# Patient Record
Sex: Male | Born: 1950
Health system: Southern US, Community
[De-identification: ages and names within clinical notes are randomized; demographics above are authoritative.]

## PROBLEM LIST (undated history)

## (undated) DIAGNOSIS — E7800 Pure hypercholesterolemia, unspecified: Secondary | ICD-10-CM

## (undated) DIAGNOSIS — Q969 Turner's syndrome, unspecified: Secondary | ICD-10-CM

## (undated) DIAGNOSIS — M5412 Radiculopathy, cervical region: Secondary | ICD-10-CM

## (undated) DIAGNOSIS — K922 Gastrointestinal hemorrhage, unspecified: Secondary | ICD-10-CM

## (undated) DIAGNOSIS — M1990 Unspecified osteoarthritis, unspecified site: Secondary | ICD-10-CM

## (undated) DIAGNOSIS — B9681 Helicobacter pylori [H. pylori] as the cause of diseases classified elsewhere: Secondary | ICD-10-CM

## (undated) DIAGNOSIS — K259 Gastric ulcer, unspecified as acute or chronic, without hemorrhage or perforation: Secondary | ICD-10-CM

## (undated) DIAGNOSIS — K279 Peptic ulcer, site unspecified, unspecified as acute or chronic, without hemorrhage or perforation: Secondary | ICD-10-CM

## (undated) HISTORY — PX: HERNIA REPAIR: SHX51

## (undated) HISTORY — PX: JOINT REPLACEMENT: SHX530

---

## 1997-10-07 ENCOUNTER — Emergency Department (HOSPITAL_COMMUNITY): Admission: EM | Admit: 1997-10-07 | Discharge: 1997-10-07 | Payer: Self-pay | Admitting: Emergency Medicine

## 1997-12-17 ENCOUNTER — Emergency Department (HOSPITAL_COMMUNITY): Admission: EM | Admit: 1997-12-17 | Discharge: 1997-12-17 | Payer: Self-pay | Admitting: Emergency Medicine

## 2000-05-19 ENCOUNTER — Emergency Department (HOSPITAL_COMMUNITY): Admission: EM | Admit: 2000-05-19 | Discharge: 2000-05-19 | Payer: Self-pay | Admitting: Emergency Medicine

## 2000-05-19 ENCOUNTER — Encounter: Payer: Self-pay | Admitting: Emergency Medicine

## 2000-05-20 ENCOUNTER — Ambulatory Visit (HOSPITAL_COMMUNITY): Admission: RE | Admit: 2000-05-20 | Discharge: 2000-05-20 | Payer: Self-pay | Admitting: Emergency Medicine

## 2000-05-20 ENCOUNTER — Encounter: Payer: Self-pay | Admitting: Emergency Medicine

## 2000-11-24 ENCOUNTER — Emergency Department (HOSPITAL_COMMUNITY): Admission: EM | Admit: 2000-11-24 | Discharge: 2000-11-24 | Payer: Self-pay | Admitting: Emergency Medicine

## 2000-11-24 ENCOUNTER — Encounter: Payer: Self-pay | Admitting: Emergency Medicine

## 2001-01-04 ENCOUNTER — Emergency Department (HOSPITAL_COMMUNITY): Admission: EM | Admit: 2001-01-04 | Discharge: 2001-01-04 | Payer: Self-pay | Admitting: Emergency Medicine

## 2001-02-21 ENCOUNTER — Encounter: Payer: Self-pay | Admitting: Emergency Medicine

## 2001-02-21 ENCOUNTER — Emergency Department (HOSPITAL_COMMUNITY): Admission: EM | Admit: 2001-02-21 | Discharge: 2001-02-21 | Payer: Self-pay | Admitting: Emergency Medicine

## 2002-01-12 ENCOUNTER — Ambulatory Visit (HOSPITAL_COMMUNITY): Admission: RE | Admit: 2002-01-12 | Discharge: 2002-01-12 | Payer: Self-pay | Admitting: Orthopedic Surgery

## 2002-01-12 ENCOUNTER — Encounter: Payer: Self-pay | Admitting: Orthopedic Surgery

## 2002-03-13 ENCOUNTER — Encounter: Payer: Self-pay | Admitting: *Deleted

## 2002-03-13 ENCOUNTER — Encounter: Admission: RE | Admit: 2002-03-13 | Discharge: 2002-03-13 | Payer: Self-pay | Admitting: *Deleted

## 2006-12-08 ENCOUNTER — Ambulatory Visit (HOSPITAL_COMMUNITY): Admission: RE | Admit: 2006-12-08 | Discharge: 2006-12-08 | Payer: Self-pay | Admitting: Neurosurgery

## 2006-12-16 ENCOUNTER — Encounter: Admission: RE | Admit: 2006-12-16 | Discharge: 2006-12-16 | Payer: Self-pay | Admitting: Neurosurgery

## 2007-01-18 ENCOUNTER — Encounter: Admission: RE | Admit: 2007-01-18 | Discharge: 2007-02-14 | Payer: Self-pay | Admitting: Neurology

## 2009-02-09 HISTORY — PX: SHOULDER ARTHROSCOPY: SHX128

## 2010-06-27 NOTE — Discharge Summary (Signed)
[place]  Patient:    Richard Arnold, Richard Arnold Visit Number: [id number] MRN: [id number]          Service Type: MED Location: 1800 1831 01 Attending Physician:  Devoria Albe Dictated by:   Wayne C. Dorna Bloom, M.D. Admit Date:  02/21/2001 Disc. Date: 02/22/01   CC:         Sidney Ace, M.D. [facility]  Elisha Ponder, M.D.   Discharge Summary  DISCHARGE DIAGNOSES: 1. Atypical chest pain likely reflux. 2. Degenerative joint disease. 3. Right rotator cuff syndrome. 4. Allergies to MSG.  DISCHARGE MEDICATIONS: 1. Nexium 40 mg a day just recently started. 2. Allegra 180 mg q.a.m. 3. Zyrtec 10 mg q.p.m. 4. Mobic 7.5 mg q.d.  HOSPITAL COURSE:  Please see history and physical for details.  Mr. Flynt was admitted after hours of chest discomfort which was not clearly caused by exertion at all.  Questionably caused by eating.  The patient was monitored overnight.  He had continued chest discomfort about 12 hours but did not bump his enzymes at all. His EKG remained unremarkable.  He was given one p.o. Protonix which definitely seemed to help his pain but it is back a little bit now.  Because his pain was continuous over 12 hours, I would expect that if it were ischemic, he would obviously have caused damage in his cardiac enzymes but he did not.  Particularly since his pain seemed to be relieved with some Protonix, I strongly believe this is likely reflux and the patient agrees. The patient will see [place] back in the office in one or two weeks.  Call back sooner if needed.  At that time, if he should not have any further chest symptoms, I do not believe any further ischemic work-up is indicated. Dictated by:   Wayne C. Dorna Bloom, M.D. Attending Physician:  Devoria Albe DD:  02/22/01 TD:  02/22/01 Job: [id number]

## 2010-06-27 NOTE — H&P (Signed)
[place]  Patient:    STANLY, SI Visit Number: [id number] MRN: [id number]          Service Type: MED Location: 1800 1831 01 Attending Physician:  Devoria Albe Dictated by:   Wayne C. Dorna Bloom, M.D. Admit Date:  02/21/2001   CC:         Elisha Ponder, M.D.  ____________________   History and Physical  HISTORY OF PRESENT ILLNESS:  [age]-year-old white male complains of nausea and chest pressure on and off for two weeks, no association with diet, although he has had similar symptoms with Vioxx and Celebrex in the last two months.  Once this occurred while walking with son.  The patient was able to continue walking today.  He had just finished walking at work, sat down, then had epigastric pressure radiating to the chest and to left arm.  He states it made him feel quite weak.  He still feels a little bit like this. Nitroglycerin was given in the emergency room without much help.  Patient denies dysphagia, cough, fever, shortness of breath.  Patient took an antacid earlier this week but this was of no help.  PAST MEDICAL HISTORY:  Right rotator cuff tears per Dr. Onalee Hua III and now patient has pain.  MRI was scheduled for tomorrow.  DJD of the left knee. ______.  Allergies, severe.  Patient sees ______ .  Chronic elevation of left hemidiaphragm.  Right hand intention tremor.  Hemorrhoids.  PAST SURGICAL HISTORY:  Right rotator cuff repair.  MEDICATIONS: 1. Allegra 180 mg q.a.m. 2. Zyrtec 10 mg q.p.m. 3. Mobic 7.5 mg q.d.  ALLERGIES:  KAOPECTATE causes a rash.  HEALTH MAINTENANCE:  Tetanus in September of 1997.  Normal flexible sigmoidoscopy and air contrast barium enema in May of 2000.  FAMILY HISTORY:  Father with coronary artery disease and CABG in his 78s; father also had colon CA and diabetes.  SOCIAL HISTORY:  Denies tobacco, alcohol or recreational drug use.  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  Married and  one child.  REVIEW OF SYSTEMS:  Left shoulder pain secondary to lying on it secondary to the right shoulder problems.  PHYSICAL EXAMINATION:  GENERAL:  Patient is comfortable and in no acute distress.  VITAL SIGNS:  Blood pressure is 149/90.  Pulse is 73 and regular.  Respiratory rate of 18.  Temperature is 97.1.  HEENT:  Negative.  CARDIOVASCULAR:  Regular rate and rhythm.  Normal S1 and S2.  Without murmurs, rubs, or gallops.  LUNGS:  Clear to auscultation bilaterally.  ABDOMEN:  Soft, normoactive bowel sounds, nontender.  EXTREMITIES:  No clubbing, cyanosis, or edema.  RECTAL:  Normal tone.  Guaiac negative.  NEUROLOGIC:  Motor strength is 5/5 bilaterally.  Sensation intact to light touch and pinprick.  Cranial nerves II-XII are intact.  ORTHOPEDICS:  All joints including shoulders have normal range of motion, no impingement sign.  LABORATORY DATA:  Chest x-ray:  Elevated left hemidiaphragm, otherwise, no active disease.  EKG is normal sinus rhythm, rate of 66.  Laboratories including complete metabolic profile are unremarkable.  CBC was unremarkable.  Troponin I 0.01, CK total of 102 and MB of 1.6.  PT and PTT are normal.  ASSESSMENT AND PLAN:  Atypical chest pain, questionable ischemia, doubt but cannot rule out, most likely secondary to reflux, however, and esophageal spasm.  Start Protonix.  Give Lovenox, nitroglycerin, enzymes and monitor.  If negative enzymes, probably Cardiolite with cardiology  consult. Dictated by:   Wayne C. Dorna Bloom, M.D. Attending Physician:  Devoria Albe DD:  02/21/01 TD:  02/22/01 Job: [id number]

## 2011-01-16 ENCOUNTER — Encounter (HOSPITAL_COMMUNITY): Payer: Self-pay | Admitting: Pharmacy Technician

## 2011-01-20 NOTE — H&P (Signed)
60 y/o right hand dominant male with worsening right shoulder pain. Pt denies injury. Pt has tried multiple injections and strengthening exercises to relieve his pain without any relief. Pt has elected to have a right total shoulder arthroplasty by Dr. Ranell Patrick to decrease pain and increase function. ROS: right shoulder pain with good strength, otherwise negative PMH: hyperlipidemia Allergies: tylox MEDS: ambien, aspirin, atorvastatin, fish oil, vitamin C, naprelan Family: coronary heart disease, cancer, congestive heart failure, diabetes PE: healthy appropriate 60 year old male in no acute distress alert and oriented x 3.  Cervical spine: full rom, no tenderness to palpation, nv intact distally, cranial nerves 2-12 intact Right shoulder: mild decrease in rom, moderate crepitus with rom, strength 5-5 bilaterally Assessment: Endstage osteoarthritis to right shoulder Plan: Total shoulder arthroplasty for pain relief and to increase rom

## 2011-01-23 ENCOUNTER — Encounter (HOSPITAL_COMMUNITY)
Admission: RE | Admit: 2011-01-23 | Discharge: 2011-01-23 | Disposition: A | Discharge status: Home / Self Care | Payer: 59 | Source: Ambulatory Visit | Attending: Orthopedic Surgery | Admitting: Orthopedic Surgery

## 2011-01-23 ENCOUNTER — Encounter (HOSPITAL_COMMUNITY): Payer: Self-pay

## 2011-01-23 HISTORY — DX: Unspecified osteoarthritis, unspecified site: M19.90

## 2011-01-23 LAB — ABO/RH: ABO/RH(D): O POS

## 2011-01-23 LAB — TYPE AND SCREEN: Antibody Screen: NEGATIVE

## 2011-01-23 LAB — DIFFERENTIAL
Basophils Relative: 1 % (ref 0–1)
Eosinophils Absolute: 0.2 10*3/uL (ref 0.0–0.7)
Lymphocytes Relative: 44 % (ref 12–46)
Lymphs Abs: 2.6 10*3/uL (ref 0.7–4.0)
Monocytes Relative: 9 % (ref 3–12)
Neutro Abs: 2.6 10*3/uL (ref 1.7–7.7)
Neutrophils Relative %: 44 % (ref 43–77)

## 2011-01-23 LAB — CBC
HCT: 42.3 % (ref 39.0–52.0)
MCH: 30.3 pg (ref 26.0–34.0)
MCHC: 35 g/dL (ref 30.0–36.0)
MCV: 86.5 fL (ref 78.0–100.0)
Platelets: 196 10*3/uL (ref 150–400)
RBC: 4.89 MIL/uL (ref 4.22–5.81)

## 2011-01-23 LAB — BASIC METABOLIC PANEL
Creatinine, Ser: 0.99 mg/dL (ref 0.50–1.35)
Potassium: 4.1 mEq/L (ref 3.5–5.1)

## 2011-01-23 LAB — PROTIME-INR: Prothrombin Time: 12.6 seconds (ref 11.6–15.2)

## 2011-01-23 MED ORDER — CHLORHEXIDINE GLUCONATE 4 % EX LIQD: 60.0000 mL | Freq: Once | CUTANEOUS | Status: DC

## 2011-01-23 NOTE — Pre-Procedure Instructions (Signed)
20 Richard Arnold  01/23/2011   Your procedure is scheduled on:  01/30/11  Report to [facility] at 800 AM.  Call this number if you have problems the morning of surgery: [phone]   Remember:   Do not eat food:After Midnight.  May have clear liquids: up to 4 Hours before arrival.  Clear liquids include soda, tea, black coffee, apple or grape juice, broth.  Take these medicines the morning of surgery with A SIP OF WATER: pain med   Stop asa , fish oil today   Do not wear jewelry, make-up or nail polish.  Do not wear lotions, powders, or perfumes. You may wear deodorant.  Do not shave 48 hours prior to surgery.  Do not bring valuables to the hospital.  Contacts, dentures or bridgework may not be worn into surgery.  Leave suitcase in the car. After surgery it may be brought to your room.  For patients admitted to the hospital, checkout time is 11:00 AM the day of discharge.   Patients discharged the day of surgery will not be allowed to drive home.  Name and phone number of your driver: gail [phone]  Special Instructions: Incentive Spirometry - Practice and bring it with you on the day of surgery. showers nite before and morn of surg chin to toes not face or head or privates   Please read over the following fact sheets that you were given: Pain Booklet, Coughing and Deep Breathing, Blood Transfusion Information and MRSA Information

## 2011-01-29 MED ORDER — CEFAZOLIN SODIUM-DEXTROSE 2-3 GM-% IV SOLR
2.0000 g | INTRAVENOUS | Status: AC
  Administered 2011-01-30: 2 g via INTRAVENOUS
  Filled 2011-01-29: qty 50

## 2011-01-30 ENCOUNTER — Encounter (HOSPITAL_COMMUNITY): Payer: Self-pay | Admitting: Anesthesiology

## 2011-01-30 ENCOUNTER — Encounter (HOSPITAL_COMMUNITY): Payer: Self-pay | Admitting: Orthopedic Surgery

## 2011-01-30 ENCOUNTER — Ambulatory Visit (HOSPITAL_COMMUNITY): Payer: 59

## 2011-01-30 ENCOUNTER — Encounter (HOSPITAL_COMMUNITY)
Admission: RE | Disposition: A | Discharge status: Home / Self Care | Payer: Self-pay | Source: Ambulatory Visit | Attending: Orthopedic Surgery

## 2011-01-30 ENCOUNTER — Inpatient Hospital Stay (HOSPITAL_COMMUNITY)
Admission: RE | Admit: 2011-01-30 | Discharge: 2011-01-31 | DRG: 484 | Disposition: A | Discharge status: Home / Self Care | Payer: 59 | Source: Ambulatory Visit | Attending: Orthopedic Surgery | Admitting: Orthopedic Surgery

## 2011-01-30 ENCOUNTER — Ambulatory Visit (HOSPITAL_COMMUNITY): Payer: 59 | Admitting: Anesthesiology

## 2011-01-30 DIAGNOSIS — M19019 Primary osteoarthritis, unspecified shoulder: Principal | ICD-10-CM | POA: Diagnosis present

## 2011-01-30 DIAGNOSIS — E785 Hyperlipidemia, unspecified: Secondary | ICD-10-CM | POA: Diagnosis present

## 2011-01-30 DIAGNOSIS — Z01812 Encounter for preprocedural laboratory examination: Secondary | ICD-10-CM

## 2011-01-30 DIAGNOSIS — Z7982 Long term (current) use of aspirin: Secondary | ICD-10-CM

## 2011-01-30 HISTORY — PX: TOTAL SHOULDER ARTHROPLASTY: SHX126

## 2011-01-30 SURGERY — ARTHROPLASTY, SHOULDER, TOTAL
Anesthesia: Regional | Site: Shoulder | Laterality: Right | Wound class: Clean

## 2011-01-30 MED ORDER — HYDROMORPHONE HCL PF 1 MG/ML IJ SOLN
INTRAMUSCULAR | Status: AC
  Filled 2011-01-30: qty 1

## 2011-01-30 MED ORDER — ONDANSETRON HCL 4 MG/2ML IJ SOLN
INTRAMUSCULAR | Status: DC | PRN
  Administered 2011-01-30: 4 mg via INTRAVENOUS

## 2011-01-30 MED ORDER — MIDAZOLAM HCL 2 MG/2ML IJ SOLN
1.0000 mg | INTRAMUSCULAR | Status: DC | PRN
  Administered 2011-01-30: 2 mg via INTRAVENOUS

## 2011-01-30 MED ORDER — OMEGA-3 FATTY ACIDS 1000 MG PO CAPS
1.0000 g | ORAL_CAPSULE | Freq: Three times a day (TID) | ORAL | Status: DC
  Administered 2011-01-30 – 2011-01-31 (×2): 1 g via ORAL
  Filled 2011-01-30 (×4): qty 1

## 2011-01-30 MED ORDER — METOCLOPRAMIDE HCL 5 MG/ML IJ SOLN
5.0000 mg | Freq: Three times a day (TID) | INTRAMUSCULAR | Status: DC | PRN
  Filled 2011-01-30: qty 2

## 2011-01-30 MED ORDER — PHENOL 1.4 % MT LIQD
1.0000 | OROMUCOSAL | Status: DC | PRN
  Filled 2011-01-30: qty 177

## 2011-01-30 MED ORDER — CEFAZOLIN SODIUM 1-5 GM-% IV SOLN
1.0000 g | Freq: Four times a day (QID) | INTRAVENOUS | Status: AC
  Administered 2011-01-30 – 2011-01-31 (×3): 1 g via INTRAVENOUS
  Filled 2011-01-30 (×3): qty 50

## 2011-01-30 MED ORDER — PHENYLEPHRINE HCL 10 MG/ML IJ SOLN
10.0000 mg | INTRAVENOUS | Status: DC | PRN
  Administered 2011-01-30: 50 ug/min via INTRAVENOUS

## 2011-01-30 MED ORDER — METOCLOPRAMIDE HCL 10 MG PO TABS: 5.0000 mg | ORAL_TABLET | Freq: Three times a day (TID) | ORAL | Status: DC | PRN

## 2011-01-30 MED ORDER — HYDROCODONE-ACETAMINOPHEN 5-325 MG PO TABS
1.0000 | ORAL_TABLET | ORAL | Status: DC | PRN
  Administered 2011-01-30: 2 via ORAL
  Administered 2011-01-30: 1 via ORAL
  Administered 2011-01-31 (×2): 2 via ORAL
  Filled 2011-01-30 (×4): qty 2
  Filled 2011-01-30: qty 1

## 2011-01-30 MED ORDER — ASPIRIN EC 81 MG PO TBEC
81.0000 mg | DELAYED_RELEASE_TABLET | Freq: Every day | ORAL | Status: DC
  Administered 2011-01-30 – 2011-01-31 (×2): 81 mg via ORAL
  Filled 2011-01-30 (×2): qty 1

## 2011-01-30 MED ORDER — FENTANYL CITRATE 0.05 MG/ML IJ SOLN
50.0000 ug | INTRAMUSCULAR | Status: DC | PRN
  Administered 2011-01-30: 100 ug via INTRAVENOUS

## 2011-01-30 MED ORDER — HYDROMORPHONE HCL PF 1 MG/ML IJ SOLN
0.2500 mg | INTRAMUSCULAR | Status: DC | PRN
  Administered 2011-01-30 (×4): 0.5 mg via INTRAVENOUS

## 2011-01-30 MED ORDER — METHOCARBAMOL 500 MG PO TABS
500.0000 mg | ORAL_TABLET | Freq: Four times a day (QID) | ORAL | Status: DC | PRN
  Administered 2011-01-30 – 2011-01-31 (×3): 500 mg via ORAL
  Filled 2011-01-30 (×4): qty 1

## 2011-01-30 MED ORDER — BUPIVACAINE-EPINEPHRINE PF 0.5-1:200000 % IJ SOLN
INTRAMUSCULAR | Status: DC | PRN
  Administered 2011-01-30: 25 mL

## 2011-01-30 MED ORDER — FENTANYL CITRATE 0.05 MG/ML IJ SOLN
INTRAMUSCULAR | Status: DC | PRN
  Administered 2011-01-30 (×3): 50 ug via INTRAVENOUS
  Administered 2011-01-30: 100 ug via INTRAVENOUS

## 2011-01-30 MED ORDER — HYDROMORPHONE HCL PF 1 MG/ML IJ SOLN
0.5000 mg | INTRAMUSCULAR | Status: DC | PRN
  Administered 2011-01-30 – 2011-01-31 (×6): 1 mg via INTRAVENOUS
  Filled 2011-01-30 (×5): qty 1

## 2011-01-30 MED ORDER — PHENYLEPHRINE HCL 10 MG/ML IJ SOLN
INTRAMUSCULAR | Status: DC | PRN
  Administered 2011-01-30: 80 ug via INTRAVENOUS
  Administered 2011-01-30 (×2): 40 ug via INTRAVENOUS
  Administered 2011-01-30 (×2): 80 ug via INTRAVENOUS
  Administered 2011-01-30: 40 ug via INTRAVENOUS

## 2011-01-30 MED ORDER — SODIUM CHLORIDE 0.9 % IR SOLN
Status: DC | PRN
  Administered 2011-01-30: 1000 mL

## 2011-01-30 MED ORDER — ACETAMINOPHEN 325 MG PO TABS: 650.0000 mg | ORAL_TABLET | Freq: Four times a day (QID) | ORAL | Status: DC | PRN

## 2011-01-30 MED ORDER — MEPERIDINE HCL 25 MG/ML IJ SOLN: 6.2500 mg | INTRAMUSCULAR | Status: DC | PRN

## 2011-01-30 MED ORDER — ZOLPIDEM TARTRATE 10 MG PO TABS: 10.0000 mg | ORAL_TABLET | Freq: Every evening | ORAL | Status: DC | PRN

## 2011-01-30 MED ORDER — ONDANSETRON HCL 4 MG/2ML IJ SOLN: 4.0000 mg | Freq: Four times a day (QID) | INTRAMUSCULAR | Status: DC | PRN

## 2011-01-30 MED ORDER — SIMVASTATIN 20 MG PO TABS
20.0000 mg | ORAL_TABLET | Freq: Every day | ORAL | Status: DC
  Filled 2011-01-30 (×2): qty 1

## 2011-01-30 MED ORDER — PROPOFOL 10 MG/ML IV EMUL
INTRAVENOUS | Status: DC | PRN
  Administered 2011-01-30: 200 mg via INTRAVENOUS

## 2011-01-30 MED ORDER — LACTATED RINGERS IV SOLN
INTRAVENOUS | Status: DC
  Administered 2011-01-30: 09:00:00 via INTRAVENOUS

## 2011-01-30 MED ORDER — HEMOSTATIC AGENTS (NO CHARGE) OPTIME
TOPICAL | Status: DC | PRN
  Administered 2011-01-30: 1 via TOPICAL

## 2011-01-30 MED ORDER — ACETAMINOPHEN 650 MG RE SUPP: 650.0000 mg | Freq: Four times a day (QID) | RECTAL | Status: DC | PRN

## 2011-01-30 MED ORDER — METHOCARBAMOL 100 MG/ML IJ SOLN
500.0000 mg | Freq: Four times a day (QID) | INTRAVENOUS | Status: DC | PRN
  Administered 2011-01-30: 500 mg via INTRAVENOUS
  Filled 2011-01-30: qty 5

## 2011-01-30 MED ORDER — MORPHINE SULFATE 2 MG/ML IJ SOLN: 0.0500 mg/kg | INTRAMUSCULAR | Status: DC | PRN

## 2011-01-30 MED ORDER — VITAMIN C 500 MG PO TABS
500.0000 mg | ORAL_TABLET | Freq: Every day | ORAL | Status: DC
  Administered 2011-01-30 – 2011-01-31 (×2): 500 mg via ORAL
  Filled 2011-01-30 (×2): qty 1

## 2011-01-30 MED ORDER — MENTHOL 3 MG MT LOZG: 1.0000 | LOZENGE | OROMUCOSAL | Status: DC | PRN

## 2011-01-30 MED ORDER — MIDAZOLAM HCL 2 MG/2ML IJ SOLN
INTRAMUSCULAR | Status: AC
  Filled 2011-01-30: qty 2

## 2011-01-30 MED ORDER — FENTANYL CITRATE 0.05 MG/ML IJ SOLN
INTRAMUSCULAR | Status: AC
  Filled 2011-01-30: qty 2

## 2011-01-30 MED ORDER — ROCURONIUM BROMIDE 100 MG/10ML IV SOLN
INTRAVENOUS | Status: DC | PRN
  Administered 2011-01-30: 50 mg via INTRAVENOUS

## 2011-01-30 MED ORDER — ONDANSETRON HCL 4 MG PO TABS: 4.0000 mg | ORAL_TABLET | Freq: Four times a day (QID) | ORAL | Status: DC | PRN

## 2011-01-30 MED ORDER — LACTATED RINGERS IV SOLN
INTRAVENOUS | Status: DC | PRN
  Administered 2011-01-30 (×2): via INTRAVENOUS

## 2011-01-30 MED ORDER — HYDROCODONE-ACETAMINOPHEN 5-325 MG PO TABS
1.0000 | ORAL_TABLET | Freq: Four times a day (QID) | ORAL | Status: DC | PRN
  Administered 2011-01-30: 1 via ORAL
  Filled 2011-01-30: qty 1

## 2011-01-30 MED ORDER — THROMBIN 20000 UNITS EX KIT
PACK | CUTANEOUS | Status: DC | PRN
  Administered 2011-01-30: 20000 [IU] via TOPICAL

## 2011-01-30 MED ORDER — POLYETHYLENE GLYCOL 3350 17 G PO PACK
17.0000 g | PACK | Freq: Every day | ORAL | Status: DC
  Administered 2011-01-30 – 2011-01-31 (×2): 17 g via ORAL
  Filled 2011-01-30 (×2): qty 1

## 2011-01-30 MED ORDER — BUPIVACAINE-EPINEPHRINE 0.25% -1:200000 IJ SOLN
INTRAMUSCULAR | Status: DC | PRN
  Administered 2011-01-30: 10 mL

## 2011-01-30 MED ORDER — POTASSIUM CHLORIDE IN NACL 20-0.9 MEQ/L-% IV SOLN
INTRAVENOUS | Status: DC
  Filled 2011-01-30 (×3): qty 1000

## 2011-01-30 MED ORDER — ONDANSETRON HCL 4 MG/2ML IJ SOLN: 4.0000 mg | Freq: Once | INTRAMUSCULAR | Status: DC | PRN

## 2011-01-30 SURGICAL SUPPLY — 64 items
BLADE SAW SAG 73X25 THK (BLADE) ×1
BLADE SAW SGTL 73X25 THK (BLADE) ×1 IMPLANT
BUR SURG 4X8 MED (BURR) IMPLANT
BURR SURG 4X8 MED (BURR)
CEMENT HV SMART SET (Cement) ×2 IMPLANT
CLOTH BEACON ORANGE TIMEOUT ST (SAFETY) ×2 IMPLANT
COVER SURGICAL LIGHT HANDLE (MISCELLANEOUS) ×2 IMPLANT
DRAPE INCISE IOBAN 66X45 STRL (DRAPES) ×2 IMPLANT
DRAPE U-SHAPE 47X51 STRL (DRAPES) ×2 IMPLANT
DRAPE X-RAY CASS 24X20 (DRAPES) IMPLANT
DRILL BIT 5/64 (BIT) IMPLANT
DRSG ADAPTIC 3X8 NADH LF (GAUZE/BANDAGES/DRESSINGS) ×2 IMPLANT
DRSG PAD ABDOMINAL 8X10 ST (GAUZE/BANDAGES/DRESSINGS) ×2 IMPLANT
DURAPREP 26ML APPLICATOR (WOUND CARE) ×2 IMPLANT
ELECT BLADE 4.0 EZ CLEAN MEGAD (MISCELLANEOUS) ×2
ELECT NEEDLE TIP 2.8 STRL (NEEDLE) ×2 IMPLANT
ELECT REM PT RETURN 9FT ADLT (ELECTROSURGICAL) ×2
ELECTRODE BLDE 4.0 EZ CLN MEGD (MISCELLANEOUS) ×1 IMPLANT
ELECTRODE REM PT RTRN 9FT ADLT (ELECTROSURGICAL) ×1 IMPLANT
GLOVE BIOGEL PI IND STRL 8 (GLOVE) IMPLANT
GLOVE BIOGEL PI INDICATOR 8 (GLOVE)
GLOVE ORTHO TXT STRL SZ7.5 (GLOVE) IMPLANT
GLOVE SURG ORTHO 8.0 STRL STRW (GLOVE) IMPLANT
GOWN BRE IMP SLV SIRUS LXLNG (GOWN DISPOSABLE) ×4 IMPLANT
GOWN STRL NON-REIN LRG LVL3 (GOWN DISPOSABLE) IMPLANT
HANDPIECE INTERPULSE COAX TIP (DISPOSABLE)
KIT BASIN OR (CUSTOM PROCEDURE TRAY) ×2 IMPLANT
KIT ROOM TURNOVER OR (KITS) ×2 IMPLANT
MANIFOLD NEPTUNE II (INSTRUMENTS) ×2 IMPLANT
NDL SUT 6 .5 CRC .975X.05 MAYO (NEEDLE) ×1 IMPLANT
NEEDLE 1/2 CIR MAYO (NEEDLE) ×2 IMPLANT
NEEDLE HYPO 25GX1X1/2 BEV (NEEDLE) ×2 IMPLANT
NEEDLE MAYO TAPER (NEEDLE) ×1
NS IRRIG 1000ML POUR BTL (IV SOLUTION) ×2 IMPLANT
PACK SHOULDER (CUSTOM PROCEDURE TRAY) ×2 IMPLANT
PAD ARMBOARD 7.5X6 YLW CONV (MISCELLANEOUS) ×4 IMPLANT
SET HNDPC FAN SPRY TIP SCT (DISPOSABLE) IMPLANT
SLING ARM IMMOBILIZER LRG (SOFTGOODS) ×2 IMPLANT
SLING ARM IMMOBILIZER MED (SOFTGOODS) IMPLANT
SMARTMIX MINI TOWER (MISCELLANEOUS) ×2
SPONGE GAUZE 4X4 12PLY (GAUZE/BANDAGES/DRESSINGS) ×2 IMPLANT
SPONGE LAP 18X18 X RAY DECT (DISPOSABLE) ×2 IMPLANT
SPONGE LAP 4X18 X RAY DECT (DISPOSABLE) ×2 IMPLANT
SPONGE SURGIFOAM ABS GEL SZ50 (HEMOSTASIS) ×2 IMPLANT
STRIP CLOSURE SKIN 1/2X4 (GAUZE/BANDAGES/DRESSINGS) IMPLANT
SUCTION FRAZIER TIP 10 FR DISP (SUCTIONS) ×2 IMPLANT
SUT FIBERWIRE #2 38 T-5 BLUE (SUTURE) ×8
SUT MNCRL AB 4-0 PS2 18 (SUTURE) ×2 IMPLANT
SUT VIC AB 0 CT1 27 (SUTURE) ×1
SUT VIC AB 0 CT1 27XBRD ANBCTR (SUTURE) ×1 IMPLANT
SUT VIC AB 2-0 CT1 27 (SUTURE) ×1
SUT VIC AB 2-0 CT1 TAPERPNT 27 (SUTURE) ×1 IMPLANT
SUT VICRYL AB 2 0 TIES (SUTURE) IMPLANT
SUTURE FIBERWR #2 38 T-5 BLUE (SUTURE) ×4 IMPLANT
SWABSTICK BENZOIN STERILE (MISCELLANEOUS) IMPLANT
SYR CONTROL 10ML LL (SYRINGE) ×2 IMPLANT
SYR TOOMEY 50ML (SYRINGE) IMPLANT
TAPE CLOTH SURG 6X10 WHT LF (GAUZE/BANDAGES/DRESSINGS) ×2 IMPLANT
TOWEL OR 17X24 6PK STRL BLUE (TOWEL DISPOSABLE) ×2 IMPLANT
TOWEL OR 17X26 10 PK STRL BLUE (TOWEL DISPOSABLE) ×2 IMPLANT
TOWER SMARTMIX MINI (MISCELLANEOUS) ×1 IMPLANT
TRAY FOLEY CATH 14FR (SET/KITS/TRAYS/PACK) IMPLANT
WATER STERILE IRR 1000ML POUR (IV SOLUTION) ×2 IMPLANT
YANKAUER SUCT BULB TIP NO VENT (SUCTIONS) IMPLANT

## 2011-01-30 NOTE — Anesthesia Procedure Notes (Addendum)
Anesthesia Regional Block:  Interscalene brachial plexus block  Pre-Anesthetic Checklist: ,, timeout performed, Correct Patient, Correct Site, Correct Laterality, Correct Procedure, Correct Position, site marked, Risks and benefits discussed,  Surgical consent,  Pre-op evaluation,  At surgeon's request and post-op pain management  Laterality: Right and Upper  Prep: chloraprep       Needles:   Needle Type: Echogenic Stimulator Needle     Needle Length: 5cm 5 cm Needle Gauge: 22 and 22 G    Additional Needles:  Procedures: ultrasound guided Interscalene brachial plexus block Narrative:  Start time: 01/30/2011 9:20 AM End time: 01/30/2011 9:30 AM Injection made incrementally with aspirations every 5 mL.  Performed by: Personally  Anesthesiologist: Sheldon Silvan, MD  Additional Notes: 25 ml of 0.5% Marcaine w/  1:200000 Epi  Interscalene brachial plexus block Procedure Name: Intubation Date/Time: 01/30/2011 10:25 AM Performed by: Rossie Muskrat Pre-anesthesia Checklist: Patient identified, Timeout performed, Emergency Drugs available, Suction available and Patient being monitored Patient Re-evaluated:Patient Re-evaluated prior to inductionOxygen Delivery Method: Circle System Utilized Preoxygenation: Pre-oxygenation with 100% oxygen Intubation Type: IV induction Ventilation: Mask ventilation without difficulty Laryngoscope Size: Miller and 3 Grade View: Grade I Tube type: Oral Tube size: 7.5 mm Number of attempts: 1 Airway Equipment and Method: stylet Placement Confirmation: ETT inserted through vocal cords under direct vision,  breath sounds checked- equal and bilateral and positive ETCO2 Secured at: 21 cm Tube secured with: Tape Dental Injury: Teeth and Oropharynx as per pre-operative assessment

## 2011-01-30 NOTE — Transfer of Care (Signed)
Immediate Anesthesia Transfer of Care Note  Patient: Richard Arnold  Procedure(s) Performed:  TOTAL SHOULDER ARTHROPLASTY - right total shoulder atrhoplasty  Patient Location: PACU  Anesthesia Type: General  Level of Consciousness: awake, alert  and oriented  Airway & Oxygen Therapy: Patient Spontanous Breathing and Patient connected to nasal cannula oxygen  Post-op Assessment: Report given to PACU RN, Post -op Vital signs reviewed and stable and Patient moving all extremities X 4  Post vital signs: Reviewed and stable  Complications: No apparent anesthesia complications

## 2011-01-30 NOTE — Anesthesia Postprocedure Evaluation (Signed)
  Anesthesia Post-op Note  Patient: Richard Arnold  Procedure(s) Performed:  TOTAL SHOULDER ARTHROPLASTY - right total shoulder atrhoplasty  Patient Location: PACU  Anesthesia Type: GA combined with regional for post-op pain  Level of Consciousness: awake, alert  and oriented  Airway and Oxygen Therapy: Patient Spontanous Breathing and Patient connected to nasal cannula oxygen  Post-op Pain: mild  Post-op Assessment: Post-op Vital signs reviewed, Patient's Cardiovascular Status Stable, Respiratory Function Stable, Patent Airway, No signs of Nausea or vomiting and Pain level not controlled  Post-op Vital Signs: Reviewed and stable  Complications: No apparent anesthesia complications

## 2011-01-30 NOTE — Discharge Summary (Signed)
Physician Discharge Summary  Patient ID: Richard Arnold MRN: [id number] DOB/AGE: 1950-10-12 60 y.o.  Admit date: 01/30/2011 Discharge date: 01/30/2011  Admission Diagnoses:  Principal Problem:  *Osteoarthrosis, unspecified whether generalized or localized, shoulder region   Discharge Diagnoses:  Same   Surgeries: Procedure(s): TOTAL SHOULDER ARTHROPLASTY on 01/30/2011   Consultants: Physical Therapy, D/C Planning  Discharged Condition: Stable  Hospital Course: Richard Arnold is an 60 y.o. male who was admitted 01/30/2011 with a chief complaint of shoulder pain and loss of function, and found to have a diagnosis of shoulder arthritis.  They were brought to the operating room on 01/30/2011 and underwent the above named procedures.    The patient had an uncomplicated hospital course, pain was well controlled and was stable for discharge.  Recent vital signs:  Filed Vitals:   01/30/11 0936  BP:   Pulse: 95  Temp:   Resp: 18    Recent laboratory studies:  Results for orders placed during the hospital encounter of 01/23/11  BASIC METABOLIC PANEL      Component Value Range   Sodium 139  135 - 145 (mEq/L)   Potassium 4.1  3.5 - 5.1 (mEq/L)   Chloride 103  96 - 112 (mEq/L)   CO2 28  19 - 32 (mEq/L)   Glucose, Bld 91  70 - 99 (mg/dL)   BUN 22  6 - 23 (mg/dL)   Creatinine, Ser 4.09  0.50 - 1.35 (mg/dL)   Calcium 9.6  8.4 - 81.1 (mg/dL)   GFR calc non Af Amer 86 (*) >90 (mL/min)   GFR calc Af Amer >90  >90 (mL/min)  CBC      Component Value Range   WBC 5.9  4.0 - 10.5 (K/uL)   RBC 4.89  4.22 - 5.81 (MIL/uL)   Hemoglobin 14.8  13.0 - 17.0 (g/dL)   HCT 91.4  78.2 - 95.6 (%)   MCV 86.5  78.0 - 100.0 (fL)   MCH 30.3  26.0 - 34.0 (pg)   MCHC 35.0  30.0 - 36.0 (g/dL)   RDW 21.3  08.6 - 57.8 (%)   Platelets 196  150 - 400 (K/uL)  DIFFERENTIAL      Component Value Range   Neutrophils Relative 44  43 - 77 (%)   Neutro Abs 2.6  1.7 - 7.7 (K/uL)   Lymphocytes Relative 44  12  - 46 (%)   Lymphs Abs 2.6  0.7 - 4.0 (K/uL)   Monocytes Relative 9  3 - 12 (%)   Monocytes Absolute 0.5  0.1 - 1.0 (K/uL)   Eosinophils Relative 3  0 - 5 (%)   Eosinophils Absolute 0.2  0.0 - 0.7 (K/uL)   Basophils Relative 1  0 - 1 (%)   Basophils Absolute 0.0  0.0 - 0.1 (K/uL)  PROTIME-INR      Component Value Range   Prothrombin Time 12.6  11.6 - 15.2 (seconds)   INR 0.92  0.00 - 1.49   SURGICAL PCR SCREEN      Component Value Range   MRSA, PCR NEGATIVE  NEGATIVE    Staphylococcus aureus NEGATIVE  NEGATIVE   TYPE AND SCREEN      Component Value Range   ABO/RH(D) O POS     Antibody Screen NEG     Sample Expiration 02/06/2011    ABO/RH      Component Value Range   ABO/RH(D) O POS      Discharge Medications:   Current Discharge Medication List  CONTINUE these medications which have NOT CHANGED   Details  Ascorbic Acid (VITAMIN C PO) Take 1 tablet by mouth daily.      aspirin EC 81 MG tablet Take 81 mg by mouth daily.      atorvastatin (LIPITOR) 10 MG tablet Take 10 mg by mouth daily.      fish oil-omega-3 fatty acids 1000 MG capsule Take 1 g by mouth 3 (three) times daily.      HYDROcodone-acetaminophen (NORCO) 5-325 MG per tablet Take 1 tablet by mouth every 6 (six) hours as needed. For pain     zolpidem (AMBIEN) 10 MG tablet Take 10 mg by mouth at bedtime as needed. For sleep          Diagnostic Studies: Post Op XRAY right shoulder show implants in good position  Disposition: to home    Follow-up Information    Follow up with Lexandra Rettke,STEVEN R. Call in 2 weeks.   Contact information:   [facility] [address] [place] [address] [phone]           Signed: Verlee Rossetti 01/30/2011, 10:03 AM

## 2011-01-30 NOTE — Preoperative (Signed)
Beta Blockers   Reason not to administer Beta Blockers:Pt does not take B Blockers 

## 2011-01-30 NOTE — Brief Op Note (Signed)
01/30/2011  1:05 PM  PATIENT:  Richard Arnold  60 y.o. male  PRE-OPERATIVE DIAGNOSIS:  right shoulder OA, end stage  POST-OPERATIVE DIAGNOSIS:  right shoulder OA, end stage  PROCEDURE:  Procedure(s): TOTAL SHOULDER ARTHROPLASTY - [employer]er[employer]  SURGEON:  Surgeon(s): Verlee Rossetti  PHYSICIAN ASSISTANT:   ASSISTANTS: Thea Gist, PA-C`   ANESTHESIA:   regional and general  EBL:  Total I/O In: 1000 [I.V.:1000] Out: -   BLOOD ADMINISTERED:none  DRAINS: none   LOCAL MEDICATIONS USED:  MARCAINE 10 CC  SPECIMEN:  No Specimen  DISPOSITION OF SPECIMEN:  N/A  COUNTS:  YES  TOURNIQUET:  * No tourniquets in log *  DICTATION: .Other Dictation: Dictation Number [id number]  PLAN OF CARE: Admit to inpatient   PATIENT DISPOSITION:  PACU - hemodynamically stable.   Delay start of Pharmacological VTE agent (>24hrs) due to surgical blood loss or risk of bleeding:  {YES/NO/NOT APPLICABLE:20182

## 2011-01-30 NOTE — Anesthesia Preprocedure Evaluation (Addendum)
Anesthesia Evaluation  Patient identified by MRN, date of birth, ID band Patient awake    Reviewed: Allergy & Precautions, H&P , NPO status , Patient's Chart, lab work & pertinent test results, reviewed documented beta blocker date and time   Airway Mallampati: I TM Distance: >3 FB Neck ROM: Full    Dental  (+) Teeth Intact and Dental Advisory Given   Pulmonary  clear to auscultation        Cardiovascular Regular Normal    Neuro/Psych    GI/Hepatic   Endo/Other    Renal/GU      Musculoskeletal   Abdominal   Peds  Hematology   Anesthesia Other Findings   Reproductive/Obstetrics                         Anesthesia Physical Anesthesia Plan  ASA: II  Anesthesia Plan: General   Post-op Pain Management:    Induction: Intravenous  Airway Management Planned: Oral ETT  Additional Equipment:   Intra-op Plan:   Post-operative Plan: Extubation in OR  Informed Consent: I have reviewed the patients History and Physical, chart, labs and discussed the procedure including the risks, benefits and alternatives for the proposed anesthesia with the patient or authorized representative who has indicated his/her understanding and acceptance.   Dental advisory given  Plan Discussed with: CRNA, Anesthesiologist and Surgeon  Anesthesia Plan Comments:        Anesthesia Quick Evaluation

## 2011-01-30 NOTE — Plan of Care (Signed)
Problem: Consults Goal: Diagnosis- Total Joint Replacement Outcome: Progressing Total shoulder  Problem: Phase I Progression Outcomes Goal: [place] evening of surgery Outcome: Progressing Has already ambulated to bathroom evening after surgery

## 2011-01-30 NOTE — Interval H&P Note (Signed)
History and Physical Interval Note:  01/30/2011 7:40 AM  Richard Arnold  has presented today for surgery, with the diagnosis of right shoulder OA  The various methods of treatment have been discussed with the patient and family. After consideration of risks, benefits and other options for treatment, the patient has consented to  Procedure(s): TOTAL SHOULDER ARTHROPLASTY as a surgical intervention .  The patients' history has been reviewed, patient examined, no change in status, stable for surgery.  I have reviewed the patients' chart and labs.  Questions were answered to the patient's satisfaction.     Richard Arnold,STEVEN R

## 2011-01-31 LAB — BASIC METABOLIC PANEL
Calcium: 8.8 mg/dL (ref 8.4–10.5)
Creatinine, Ser: 0.92 mg/dL (ref 0.50–1.35)
GFR calc non Af Amer: 89 mL/min — ABNORMAL LOW (ref >90)
Glucose, Bld: 139 mg/dL — ABNORMAL HIGH (ref 70–99)
Sodium: 133 mEq/L — ABNORMAL LOW (ref 135–145)

## 2011-01-31 LAB — HEMOGLOBIN AND HEMATOCRIT, BLOOD: HCT: 34.8 % — ABNORMAL LOW (ref 39.0–52.0)

## 2011-01-31 NOTE — Op Note (Signed)
NAME:  Richard Arnold, Richard Arnold NO.:  [id number]  MEDICAL RECORD NO.:  [id number]  LOCATION:  5025                         FACILITY:  [facility]  PHYSICIAN:  Almedia Balls. Ranell Patrick, M.D. DATE OF BIRTH:  1950-05-21  DATE OF PROCEDURE:  01/30/2011 DATE OF DISCHARGE:                              OPERATIVE REPORT   PREOP DIAGNOSIS:  End-stage arthritis, right shoulder.  POSTOP DIAGNOSIS:  End-stage arthritis, right shoulder.  PROCEDURE PERFORMED:  Right total shoulder arthroplasty, DePuy [employer] Advantage system.  ATTENDING SURGEON:  Almedia Balls. Ranell Patrick, MD  ASSISTANT:  Modesto Charon, PA-C who was scrubbed during the entire procedure and necessary for appropriate completion of procedure  ANESTHESIA:  General anesthesia plus interscalene block anesthesia was used.  ESTIMATED BLOOD LOSS:  100 mL.  FLUID REPLACEMENT:  1500 mL crystalloid.  INSTRUMENT COUNTS:  Correct.  COMPLICATIONS:  None.  Perioperative antibiotics were given.  INDICATIONS:  The patient is a 60 year old male with worsening right shoulder pain secondary to end-stage osteoarthritis.  The patient has had multiple arthroscopic procedures and rotator cuff repair.  He has had progressive pain in her shoulder despite conservative management. He has evidence of severe arthritis in the shoulder based on his preoperative imaging.  The patient desires operative treatment.  Risks and benefits of surgical management were discussed.  He elected to proceed with surgery versus continue to proceed with nonsurgical management, which had failed.  Informed consent obtained.  DESCRIPTION OF PROCEDURE:  After adequate level of anesthesia was achieved, the patient was positioned in a modified beachchair position. Right shoulder was prepped and draped in usual manner.  Time-out was called.  We initiated surgery with the deltopectoral incision, starting at the coracoid process extending down to the anterior humeral  shaft. Dissection down through subcu tissues using Bovie.  Cephalic vein identified, taken laterally to the deltoid.  The pectoralis taken medially.  Conjoined tendon identified and retracted medially.  We did release scar tissue under the deltoid between the deltoid and the rotator cuff.  The cuff was intact.  We released the subscapularis subperiosteally and placed #2 FiberWire suture into that tendon.  Next, we went ahead and progressively externally rotated the humerus releasing the capsule off the inferior humeral neck.  We then resected the head with set on about 10-15 degrees of retroversion with the oscillating saw.  Using the neck cut guide, we then went ahead and broached and prepared the humerus for size 10 stem.  We could not get a 12 down all the way and once we had that size 10 stem in place, we subluxed the humerus posteriorly, removed the capsule in its entirety, and then went ahead and prepared our glenoid.  We removed the remaining cartilage, placed the centered guide pin and then reamed for a 44 implant.  Once the central hole was drilled for the peg, we drilled our peripheral 3 holes referencing off of the inferior scapular pillar.  Once that was done, we went and placed epi soaked Gelfoam into the peripheral holes to dry those and in drying sponge, we mixed the DePuy 1 cement on the back table and inserted that into the peripheral holes and then  impacted the anchor peg glenoid by a 44, by a DePuy into place and held it until all cement was hard for 15 minutes on the back table.  Once the cement was hard, we checked around the glenoid and we had nice firm seating of that with good bony support every where.  We checked the nerve was in good condition.  We thoroughly irrigated, and then the patient's biceps had previously ruptured.  We are protecting the rotator cuff the entire time.  Next. We went ahead and trialed our humeral head component and was a 44 x 18 eccentric.   It fit perfectly with good translation about 3% inferiorly and 3% posteriorly.  Removed all trial components, impaction grafted with available graft from the head and then impacted a size 10 Global Advantage stem into place.  We placed some #2 FiberWire sutures for subscap repair before that.  Once we impacted that stem in place, we placed our real size 44 x 18 eccentric with the head out superiorly or posteriorly with best coverage, impacted that in place, reduced our shoulder.  We are happy again with soft tissue balancing. We then repaired the subscapularis anatomically including rotatable suture with #2 FiberWire suture through bone, sutured around through the biceps tendon with an anatomic repair of the subscap, not under tension at 45 degrees of external rotation.  We thoroughly irrigated.  We then went ahead and closed the deltopectoral interval with 0 Vicryl suture followed by 2-0 Vicryl subcutaneous closure and 4-0 Monocryl for skin. Steri-Strips applied followed by sterile dressing.  The patient tolerated the surgery well.     Almedia Balls. Ranell Patrick, M.D.     SRN/MEDQ  D:  01/30/2011  T:  01/31/2011  Job:  [id number]

## 2011-01-31 NOTE — Progress Notes (Signed)
Subjective: 1 Day Post-Op Procedure(s) (LRB): TOTAL SHOULDER ARTHROPLASTY (Right) Patient reports pain as 6/10    Objective: Vital signs in last 24 hours: Temp:  [97.7 F (36.5 C)-98.6 F (37 C)] 98.3 F (36.8 C) (12/22 0609) Pulse Rate:  [73-95] 78  (12/22 0609) Resp:  [11-26] 19  (12/22 0609) BP: (108-126)/(63-78) 117/74 mmHg (12/22 0609) SpO2:  [92 %-100 %] 92 % (12/22 0609)  Intake/Output from previous day: 12/21 0701 - 12/22 0700 In: 2780 [P.O.:480; I.V.:2250; IV Piggyback:50] Out: 550 [Urine:500; Blood:50] Intake/Output this shift:     Basename 01/31/11 0530  HGB 11.9*    Basename 01/31/11 0530  WBC --  RBC --  HCT 34.8*  PLT --    Basename 01/31/11 0530  NA 133*  K 3.8  CL 99  CO2 26  BUN 15  CREATININE 0.92  GLUCOSE 139*  CALCIUM 8.8   No results found for this basename: LABPT:2,INR:2 in the last 72 hours  shoulder dressing CDI, NVI R UE, minimal pain with gentle AAROM  Assessment/Plan: 1 Day Post-Op Procedure(s) (LRB): TOTAL SHOULDER ARTHROPLASTY (Right) Advance diet D/C home F/u/ 2 weeks HH OT for AAROM, pendulum exercises, gentle ADLs  Maria Coin,STEVEN R 01/31/2011, 9:07 AM

## 2011-01-31 NOTE — Progress Notes (Signed)
01/31/11     OT Evaluation and Discharge    Patient is being seen acute By OT services:  Pt. has no further acute therapy needs. Please refer to evaluation form in ghost chart for therapy notes. Recommend pt. progress rehab of shoulder as ordered by MD post follow-up appointment. Pt. Required S A with ADLs and is MOd I A with ADL Mobility. Pt will have all necessary A and DME for safe d/c home. OT will sign off. All education complete.    Cassandria Anger, OTR/L Pager: [phone] 01/31/2011 . Marland Kitchen

## 2011-02-05 ENCOUNTER — Encounter (HOSPITAL_COMMUNITY): Payer: Self-pay | Admitting: Orthopedic Surgery

## 2011-09-05 ENCOUNTER — Encounter (HOSPITAL_COMMUNITY): Payer: Self-pay | Admitting: *Deleted

## 2011-09-05 ENCOUNTER — Inpatient Hospital Stay (HOSPITAL_COMMUNITY)
Admission: EM | Admit: 2011-09-05 | Discharge: 2011-09-08 | DRG: 811 | Disposition: A | Discharge status: Home / Self Care | Payer: 59 | Attending: Internal Medicine | Admitting: Internal Medicine

## 2011-09-05 DIAGNOSIS — Z79899 Other long term (current) drug therapy: Secondary | ICD-10-CM

## 2011-09-05 DIAGNOSIS — K2961 Other gastritis with bleeding: Secondary | ICD-10-CM | POA: Diagnosis present

## 2011-09-05 DIAGNOSIS — D62 Acute posthemorrhagic anemia: Secondary | ICD-10-CM | POA: Diagnosis present

## 2011-09-05 DIAGNOSIS — K2980 Duodenitis without bleeding: Secondary | ICD-10-CM | POA: Diagnosis present

## 2011-09-05 DIAGNOSIS — E7800 Pure hypercholesterolemia, unspecified: Secondary | ICD-10-CM | POA: Diagnosis present

## 2011-09-05 DIAGNOSIS — M19019 Primary osteoarthritis, unspecified shoulder: Secondary | ICD-10-CM

## 2011-09-05 DIAGNOSIS — T3995XA Adverse effect of unspecified nonopioid analgesic, antipyretic and antirheumatic, initial encounter: Secondary | ICD-10-CM | POA: Diagnosis present

## 2011-09-05 DIAGNOSIS — K922 Gastrointestinal hemorrhage, unspecified: Secondary | ICD-10-CM

## 2011-09-05 HISTORY — DX: Radiculopathy, cervical region: M54.12

## 2011-09-05 HISTORY — DX: Pure hypercholesterolemia, unspecified: E78.00

## 2011-09-05 HISTORY — DX: Turner's syndrome, unspecified: Q96.9

## 2011-09-05 LAB — COMPREHENSIVE METABOLIC PANEL
AST: 13 U/L (ref 0–37)
Albumin: 3.4 g/dL — ABNORMAL LOW (ref 3.5–5.2)
GFR calc Af Amer: >90 mL/min (ref >90)
GFR calc non Af Amer: 88 mL/min — ABNORMAL LOW (ref >90)
Glucose, Bld: 122 mg/dL — ABNORMAL HIGH (ref 70–99)
Potassium: 5 mEq/L (ref 3.5–5.1)
Sodium: 135 mEq/L (ref 135–145)
Total Bilirubin: 0.4 mg/dL (ref 0.3–1.2)
Total Protein: 6.2 g/dL (ref 6.0–8.3)

## 2011-09-05 LAB — CBC WITH DIFFERENTIAL/PLATELET
Basophils Absolute: 0 10*3/uL (ref 0.0–0.1)
Basophils Relative: 0 % (ref 0–1)
Eosinophils Absolute: 0 10*3/uL (ref 0.0–0.7)
Lymphs Abs: 0.9 10*3/uL (ref 0.7–4.0)
MCH: 30.5 pg (ref 26.0–34.0)
Neutrophils Relative %: 90 % — ABNORMAL HIGH (ref 43–77)
Platelets: 184 10*3/uL (ref 150–400)
RBC: 3.8 MIL/uL — ABNORMAL LOW (ref 4.22–5.81)
RDW: 13.1 % (ref 11.5–15.5)

## 2011-09-05 LAB — ABO/RH: ABO/RH(D): O POS

## 2011-09-05 LAB — APTT: aPTT: 27 seconds (ref 24–37)

## 2011-09-05 LAB — TYPE AND SCREEN: ABO/RH(D): O POS

## 2011-09-05 LAB — PHOSPHORUS: Phosphorus: 1.9 mg/dL — ABNORMAL LOW (ref 2.3–4.6)

## 2011-09-05 LAB — PROTIME-INR
INR: 1.05 (ref 0.00–1.49)
Prothrombin Time: 13.9 seconds (ref 11.6–15.2)

## 2011-09-05 LAB — MAGNESIUM: Magnesium: 1.6 mg/dL (ref 1.5–2.5)

## 2011-09-05 MED ORDER — TEMAZEPAM 15 MG PO CAPS
15.0000 mg | ORAL_CAPSULE | Freq: Every evening | ORAL | Status: DC | PRN
  Administered 2011-09-05 – 2011-09-06 (×2): 15 mg via ORAL
  Filled 2011-09-05 (×2): qty 1

## 2011-09-05 MED ORDER — ONDANSETRON HCL 4 MG/2ML IJ SOLN: 4.0000 mg | Freq: Four times a day (QID) | INTRAMUSCULAR | Status: DC | PRN

## 2011-09-05 MED ORDER — ONDANSETRON HCL 4 MG PO TABS: 4.0000 mg | ORAL_TABLET | Freq: Four times a day (QID) | ORAL | Status: DC | PRN

## 2011-09-05 MED ORDER — PANTOPRAZOLE SODIUM 40 MG IV SOLR
40.0000 mg | Freq: Two times a day (BID) | INTRAVENOUS | Status: DC
  Administered 2011-09-05: 40 mg via INTRAVENOUS
  Filled 2011-09-05 (×3): qty 40

## 2011-09-05 MED ORDER — ATORVASTATIN CALCIUM 10 MG PO TABS
10.0000 mg | ORAL_TABLET | Freq: Every day | ORAL | Status: DC
  Administered 2011-09-06 – 2011-09-07 (×2): 10 mg via ORAL
  Filled 2011-09-05 (×3): qty 1

## 2011-09-05 MED ORDER — SODIUM CHLORIDE 0.9 % IV SOLN
Freq: Once | INTRAVENOUS | Status: AC
  Administered 2011-09-08: 500 mL via INTRAVENOUS

## 2011-09-05 MED ORDER — SODIUM CHLORIDE 0.9 % IJ SOLN
3.0000 mL | Freq: Two times a day (BID) | INTRAMUSCULAR | Status: DC
  Administered 2011-09-05 – 2011-09-08 (×6): 3 mL via INTRAVENOUS

## 2011-09-05 NOTE — ED Provider Notes (Signed)
History     CSN: [id number]  Arrival date & time 09/05/11  1523   First MD Initiated Contact with Patient 09/05/11 1534      Chief Complaint  Patient presents with  . GI Bleeding   patient presents from outside facility complaining of black and hard stools x24 hours. Notes some dizziness worse with standing. Denies any abdominal pain or vomiting blood. No blood noted in his stools. Patient went to an urgent care Center prior to arrival where he had a positive stool guaiac card noted. He also had blood work performed which showed a hemoglobin of 12. He has no prior history of gastrointestinal bleeding. He does note he recently started using NSAIDs for muscular skeletal pain.  (Consider location/radiation/quality/duration/timing/severity/associated sxs/prior treatment) The history is provided by the patient.    Past Medical History  Diagnosis Date  . Arthritis   . Hypercholesteremia   . Turner syndrome   . Brachial neuritis     Past Surgical History  Procedure Date  . Shoulder arthroscopy 11    x4  one open rt  . Total shoulder arthroplasty 01/30/2011    Procedure: TOTAL SHOULDER ARTHROPLASTY;  Surgeon: Verlee Rossetti;  Location: [facility] OR;  Service: Orthopedics;  Laterality: Right;  right total shoulder atrhoplasty    History reviewed. No pertinent family history.  History  Substance Use Topics  . Smoking status: Never Smoker   . Smokeless tobacco: Not on file  . Alcohol Use: No      Review of Systems  All other systems reviewed and are negative.    Allergies  Monosodium glutamate; Tylox; and Latex  Home Medications   Current Outpatient Rx  Name Route Sig Dispense Refill  . VITAMIN C PO Oral Take 1 tablet by mouth daily.      . ASPIRIN EC 81 MG PO TBEC Oral Take 81 mg by mouth daily.      . ATORVASTATIN CALCIUM 10 MG PO TABS Oral Take 10 mg by mouth daily.      . OMEGA-3 FATTY ACIDS 1000 MG PO CAPS Oral Take 2 g by mouth daily.     Marland Kitchen NAPROXEN SODIUM 220 MG PO  TABS Oral Take 220 mg by mouth 2 (two) times daily with a meal.    . TEMAZEPAM 15 MG PO CAPS Oral Take 15 mg by mouth at bedtime as needed.      BP 122/76  Pulse 87  Temp 98.3 F (36.8 C) (Oral)  Resp 19  SpO2 99%  Physical Exam  Nursing note and vitals reviewed. Constitutional: He is oriented to person, place, and time. He appears well-developed and well-nourished.  Non-toxic appearance. No distress.  HENT:  Head: Normocephalic and atraumatic.  Eyes: Conjunctivae, EOM and lids are normal. Pupils are equal, round, and reactive to light.  Neck: Normal range of motion. Neck supple. No tracheal deviation present. No mass present.  Cardiovascular: Normal rate, regular rhythm and normal heart sounds.  Exam reveals no gallop.   No murmur heard. Pulmonary/Chest: Effort normal and breath sounds normal. No stridor. No respiratory distress. He has no decreased breath sounds. He has no wheezes. He has no rhonchi. He has no rales.  Abdominal: Soft. Normal appearance and bowel sounds are normal. He exhibits no distension. There is no tenderness. There is no rigidity, no rebound, no guarding and no CVA tenderness.  Musculoskeletal: Normal range of motion. He exhibits no edema and no tenderness.  Neurological: He is alert and oriented to person, place, and  time. He has normal strength. No cranial nerve deficit or sensory deficit. GCS eye subscore is 4. GCS verbal subscore is 5. GCS motor subscore is 6.  Skin: Skin is warm and dry. No abrasion and no rash noted.  Psychiatric: He has a normal mood and affect. His speech is normal and behavior is normal.    ED Course  Procedures (including critical care time)   Labs Reviewed  CBC WITH DIFFERENTIAL  COMPREHENSIVE METABOLIC PANEL  PROTIME-INR  APTT  TYPE AND SCREEN   No results found.   No diagnosis found.    MDM  Patient to be admitted by hospitalist for evaluation of his likely upper gastrointestinal bleeding        Toy Baker, MD 09/05/11 1556

## 2011-09-05 NOTE — ED Notes (Signed)
QIO:NG29<BM> Expected date:09/05/11<BR> Expected time: 3:19 PM<BR> Means of arrival:Ambulance<BR> Comments:<BR> GI Bleed

## 2011-09-05 NOTE — H&P (Addendum)
[facility] History and Physical  Richard Arnold BJY:[id number] DOB: 23-Aug-1950 DOA: 09/05/2011  Referring physician: ER physician PCP: Katy Apo, MD   Chief Complaint: black tarry stool  HPI:  61 year old male with history of dyslipidemia who presents for evaluation of black stools noticed 1 day prior to this admission. Patient reports using naproxen for pain occasionally and aspirin for heart disease prevention. Patient reports no visible blood in stool. Patient reported no associated abdominal pain, no nausea or vomiting. He did experience mild dizziness on one occasion prior to arrival to [facility] ED, otherwise no lightheadedness or loss of consciousness. No complaints of chest pain, no shortness of breath, no fever or chills.  Assessment and Plan:  Principal Problem:  *Acute blood loss anemia  Secondary to upper GI bleed secondary to aspirin and naproxen  At the time of admission we don't have ED labs available but we do know from outside facility Hgb~12  Will admit patient to telemetry  Follow up admission labs  Continue normal saline  Start protonix 40 mg IC Q 12 hours and if hemoglobin stable will switch to PO daily regimen  HOLD aspirin and naproxen  Active Problems: Dyslipidemia  Continue atorvastatin  Diet  NPO or advance as tolerated  DVT prophylaxis  SCD bilaterally   Review of Systems:   Constitutional: Negative for fever, chills and malaise/fatigue. Negative for diaphoresis.  HENT: Negative for hearing loss, ear pain, nosebleeds, congestion, sore throat, neck pain, tinnitus and ear discharge.   Eyes: Negative for blurred vision, double vision, photophobia, pain, discharge and redness.  Respiratory: Negative for cough, hemoptysis, sputum production, shortness of breath, wheezing and stridor.   Cardiovascular: Negative for chest pain, palpitations, orthopnea, claudication and leg swelling.  Gastrointestinal: positive for black stool, no nausea or  vomiting  Genitourinary: Negative for dysuria, urgency, frequency, hematuria and flank pain.  Musculoskeletal: Negative for myalgias, back pain, joint pain and falls.  Skin: Negative for itching and rash.  Neurological: Negative for dizziness and weakness. Negative for tingling, tremors, sensory change, speech change, focal weakness, loss of consciousness and headaches.  Endo/Heme/Allergies: Negative for environmental allergies and polydipsia. Does not bruise/bleed easily.  Psychiatric/Behavioral: Negative for suicidal ideas. The patient is not nervous/anxious.      Past Medical History  Diagnosis Date  . Arthritis   . Hypercholesteremia   . Turner syndrome   . Brachial neuritis    Past Surgical History  Procedure Date  . Shoulder arthroscopy 11    x4  one open rt  . Total shoulder arthroplasty 01/30/2011    Procedure: TOTAL SHOULDER ARTHROPLASTY;  Surgeon: Verlee Rossetti;  Location: [facility] OR;  Service: Orthopedics;  Laterality: Right;  right total shoulder atrhoplasty   Social History:  reports that he has never smoked. He does not have any smokeless tobacco history on file. He reports that he does not drink alcohol or use illicit drugs.  Allergies  Allergen Reactions  . Monosodium Glutamate Swelling    Rash, throat swells  . Tylox (Oxycodone-Acetaminophen)     Pt states he had a "weird feeling"  . Latex Rash    Family History: DM in mother, HTN in father  Prior to Admission medications   Medication Sig Start Date End Date Taking? Authorizing Provider  Ascorbic Acid (VITAMIN C PO) Take 1 tablet by mouth daily.     Yes Historical Provider, MD  aspirin EC 81 MG tablet Take 81 mg by mouth daily.     Yes Historical Provider,  MD  atorvastatin (LIPITOR) 10 MG tablet Take 10 mg by mouth daily.     Yes Historical Provider, MD  fish oil-omega-3 fatty acids 1000 MG capsule Take 2 g by mouth daily.    Yes Historical Provider, MD  naproxen sodium (ANAPROX) 220 MG tablet Take 220 mg by  mouth 2 (two) times daily with a meal.   Yes Historical Provider, MD  temazepam (RESTORIL) 15 MG capsule Take 15 mg by mouth at bedtime as needed.   Yes Historical Provider, MD   Physical Exam: Filed Vitals:   09/05/11 1527  BP: 122/76  Pulse: 87  Temp: 98.3 F (36.8 C)  TempSrc: Oral  Resp: 19  SpO2: 99%    Physical Exam  Constitutional: Appears well-developed and well-nourished. No distress.  HENT: Normocephalic. External right and left ear normal. Oropharynx is clear and moist.  Eyes: Conjunctivae and EOM are normal. PERRLA, no scleral icterus.  Neck: Normal ROM. Neck supple. No JVD. No tracheal deviation. No thyromegaly.  CVS: RRR, S1/S2 +, no murmurs, no gallops, no carotid bruit.  Pulmonary: Effort and breath sounds normal, no stridor, rhonchi, wheezes, rales.  Abdominal: Soft. BS +,  no distension, tenderness, rebound or guarding.  Musculoskeletal: Normal range of motion. No edema and no tenderness.  Lymphadenopathy: No lymphadenopathy noted, cervical, inguinal. Neuro: Alert. Normal reflexes, muscle tone coordination. No cranial nerve deficit. Skin: Skin is warm and dry. No rash noted. Not diaphoretic. No erythema. No pallor.  Psychiatric: Normal mood and affect. Behavior, judgment, thought content normal.   Labs on Admission:  Basic Metabolic Panel: No results found for this basename: NA:5,K:5,CL:5,CO2:5,GLUCOSE:5,BUN:5,CREATININE:5,CALCIUM:5,MG:5,PHOS:5 in the last 168 hours Liver Function Tests: No results found for this basename: AST:5,ALT:5,ALKPHOS:5,BILITOT:5,PROT:5,ALBUMIN:5 in the last 168 hours No results found for this basename: LIPASE:5,AMYLASE:5 in the last 168 hours No results found for this basename: AMMONIA:5 in the last 168 hours CBC: No results found for this basename: WBC:5,NEUTROABS:5,HGB:5,HCT:5,MCV:5,PLT:5 in the last 168 hours Cardiac Enzymes: No results found for this basename: CKTOTAL:5,CKMB:5,CKMBINDEX:5,TROPONINI:5 in the last 168  hours BNP: No components found with this basename: POCBNP:5 CBG: No results found for this basename: GLUCAP:5 in the last 168 hours  Radiological Exams on Admission: No results found.  EKG: Normal sinus rhythm, no ST/T wave changes  Code Status: Full Family Communication: Pt at bedside Disposition Plan: Admit for further evaluation; PT evaluation  Manson Passey, MD  [facility] Pager [phone]  If 7PM-7AM, please contact night-coverage [email or web address] Password [facility] 09/05/2011, 4:07 PM

## 2011-09-05 NOTE — ED Notes (Addendum)
Patient had black tarry stool last night and again this am.  Patient went to [facility] Urgent care and was sent here for further evaluations.  Patient fell last night and has a small abrasion to th right side of his head.  Patient states that he had three episodes of diaphoresis, dizziness, and black tarry stools.  Patient is alert and oriented x 3 and denies history of same in the past

## 2011-09-06 LAB — CBC
HCT: 28.4 % — ABNORMAL LOW (ref 39.0–52.0)
HCT: 30.6 % — ABNORMAL LOW (ref 39.0–52.0)
Hemoglobin: 10 g/dL — ABNORMAL LOW (ref 13.0–17.0)
Hemoglobin: 10.7 g/dL — ABNORMAL LOW (ref 13.0–17.0)
MCH: 30.4 pg (ref 26.0–34.0)
MCH: 30.2 pg (ref 26.0–34.0)
MCHC: 35 g/dL (ref 30.0–36.0)
MCV: 86.3 fL (ref 78.0–100.0)
MCV: 86.4 fL (ref 78.0–100.0)
RBC: 3.29 MIL/uL — ABNORMAL LOW (ref 4.22–5.81)
RDW: 13.3 % (ref 11.5–15.5)
WBC: 7.9 10*3/uL (ref 4.0–10.5)

## 2011-09-06 LAB — COMPREHENSIVE METABOLIC PANEL
AST: 18 U/L (ref 0–37)
Albumin: 3.1 g/dL — ABNORMAL LOW (ref 3.5–5.2)
BUN: 43 mg/dL — ABNORMAL HIGH (ref 6–23)
Calcium: 8.6 mg/dL (ref 8.4–10.5)
Chloride: 108 mEq/L (ref 96–112)
Creatinine, Ser: 0.94 mg/dL (ref 0.50–1.35)
Total Bilirubin: 0.4 mg/dL (ref 0.3–1.2)
Total Protein: 5.7 g/dL — ABNORMAL LOW (ref 6.0–8.3)

## 2011-09-06 LAB — TSH: TSH: 0.426 u[IU]/mL (ref 0.350–4.500)

## 2011-09-06 MED ORDER — PANTOPRAZOLE SODIUM 40 MG PO TBEC
40.0000 mg | DELAYED_RELEASE_TABLET | Freq: Every day | ORAL | Status: DC
  Administered 2011-09-06 – 2011-09-08 (×3): 40 mg via ORAL
  Filled 2011-09-06 (×2): qty 1

## 2011-09-06 NOTE — Evaluation (Signed)
Physical Therapy Evaluation Patient Details Name: Richard Arnold MRN: [id number] DOB: [birth year] Today's Date: 09/06/2011 Time: 4098-1191 PT Time Calculation (min): 8 min  PT Assessment / Plan / Recommendation Clinical Impression  Pt admitted for acute blood loss anemia.  Pt doing very well with mobility and feels better since admission.  Pt denies dizziness with ambulation.  No acute PT needs identified at this time.    PT Assessment  Patent does not need any further PT services    Follow Up Recommendations  No PT follow up    Barriers to Discharge        Equipment Recommendations  None recommended by PT    Recommendations for Other Services     Frequency      Precautions / Restrictions Precautions Precautions: None   Pertinent Vitals/Pain No pain      Mobility  Bed Mobility Bed Mobility: Not assessed Transfers Transfers: Stand to Sit;Sit to Stand Sit to Stand: 7: Independent Stand to Sit: 7: Independent Ambulation/Gait Ambulation/Gait Assistance: 7: Independent Ambulation Distance (Feet): 400 Feet Assistive device: None Ambulation/Gait Assistance Details: SaO2 91-95% with ambulation on room air, HR 110, pt denies dizziness Gait Pattern: Within Functional Limits    Exercises     PT Diagnosis:    PT Problem List:   PT Treatment Interventions:     PT Goals    Visit Information  Last PT Received On: 09/06/11 Assistance Needed: +1    Subjective Data  Subjective: I passed out on the toilet at home.   Prior Functioning  Home Living Lives With: Spouse Type of Home: House Home Access: Level entry Home Layout: Two level;Able to live on main level with bedroom/bathroom Home Adaptive Equipment: None Prior Function Level of Independence: Independent Communication Communication: No difficulties    Cognition  Overall Cognitive Status: Appears within functional limits for tasks assessed/performed Arousal/Alertness: Awake/alert Orientation Level: Appears  intact for tasks assessed Behavior During Session: [facility] for tasks performed    Extremity/Trunk Assessment Right Upper Extremity Assessment RUE ROM/Strength/Tone: [facility] for tasks assessed Left Upper Extremity Assessment LUE ROM/Strength/Tone: [facility] for tasks assessed Right Lower Extremity Assessment RLE ROM/Strength/Tone: [facility] for tasks assessed Left Lower Extremity Assessment LLE ROM/Strength/Tone: [facility] for tasks assessed   Balance    End of Session PT - End of Session Activity Tolerance: Patient tolerated treatment well Patient left: in chair;with call bell/phone within reach  GP     [facility] E 09/06/2011, 9:39 AM Pager: [phone]

## 2011-09-06 NOTE — Progress Notes (Signed)
Patient ID: Richard Arnold, male   DOB: 05/23/1950, 61 y.o.   MRN: [id number] [facility] PROGRESS NOTE  Richard Arnold MRN:[id number] DOB: 09/16/1950 DOA: 09/05/2011 PCP: POLITE,RONALD D, MD  Brief narrative: 61 year old male with history of dyslipidemia who presents for evaluation of black stools noticed 1 day prior to this admission. Patient reports using naproxen for pain occasionally and aspirin for heart disease prevention. Patient reports no visible blood in stool. Patient reported no associated abdominal pain, no nausea or vomiting. He did experience mild dizziness on one occasion prior to arrival to [facility] ED, otherwise no lightheadedness or loss of consciousness. No complaints of chest pain, no shortness of breath, no fever or chills.   Assessment and Plan:   Principal Problem:  *Acute blood loss anemia  Secondary to upper GI bleed secondary to aspirin and naproxen  CBC this am 10, slight drop since admission  patient remains hemodynamically stable Will recheck Hgb this afternoon  Continue Protonix 40 mg PO Q 12 hours HOLD aspirin and naproxen  Active Problems:  Dyslipidemia  Continue atorvastatin Diet  NPO or advance as tolerated DVT prophylaxis  SCD bilaterally   Lorrinda Ramstad, MD  [facility] Pager [phone]  If 7PM-7AM, please contact night-coverage [email or web address] Password [email or web address] 09/06/2011, 7:16 AM   LOS: 1 day   HPI/Subjective: No acute events overnight. Patient did not have another BM so not sure if he continues to have black stool.  Objective: Filed Vitals:   09/05/11 1736 09/05/11 2230 09/06/11 0625 09/06/11 0656  BP: 100/70 119/72 109/70   Pulse: 100 94 80   Temp: 98.2 F (36.8 C) 98 F (36.7 C) 98.2 F (36.8 C)   TempSrc: Oral Oral Oral   Resp: 18 20 18   Height: 5' 8" (1.727 m)     Weight: 190 lb 11.2 oz (86.5 kg)   186 lb 11.7 oz (84.7 kg)  SpO2: 98% 97% 98%     Intake/Output Summary (Last 24 hours) at 09/06/11 0716 Last data  filed at 09/05/11 1856  Gross per 24 hour  Intake      0 ml  Output    300 ml  Net   -300 ml    Exam:   General:  Pt is alert, follows commands appropriately, not in acute distress  Cardiovascular: Regular rate and rhythm, S1/S2, no murmurs, no rubs, no gallops  Respiratory: Clear to auscultation bilaterally, no wheezing, no crackles, no rhonchi  Abdomen: Soft, non tender, non distended, bowel sounds present, no guarding  Extremities: No edema, pulses DP and PT palpable bilaterally  Neuro: Grossly nonfocal  Data Reviewed: Basic Metabolic Panel:  Lab 09/06/11 0520 09/05/11 1600  NA 139 135  K 4.2 5.0  CL 108 103  CO2 24 23  GLUCOSE 104* 122*  BUN 43* 56*  CREATININE 0.94 0.91  CALCIUM 8.6 8.7  MG -- 1.6  PHOS -- 1.9*   Liver Function Tests:  Lab 09/06/11 0520 09/05/11 1600  AST 18 13  ALT 11 11  ALKPHOS 40 49  BILITOT 0.4 0.4  PROT 5.7* 6.2  ALBUMIN 3.1* 3.4*   CBC:  Lab 09/06/11 0520 09/05/11 1600  WBC 7.9 12.0*  HGB 10.0* 11.6*  HCT 28.4* 33.1*  MCV 86.3 87.1  PLT 170 184    Studies: No results found.  Scheduled Meds:   . atorvastatin  10 mg Oral q1800  . pantoprazole (PROTONIX) IV  40 mg Intravenous Q12H   Continuous Infusions:        

## 2011-09-07 LAB — CBC
HCT: 29.5 % — ABNORMAL LOW (ref 39.0–52.0)
Hemoglobin: 10.2 g/dL — ABNORMAL LOW (ref 13.0–17.0)
MCH: 30 pg (ref 26.0–34.0)
MCHC: 34.6 g/dL (ref 30.0–36.0)
RBC: 3.4 MIL/uL — ABNORMAL LOW (ref 4.22–5.81)

## 2011-09-07 LAB — GLUCOSE, CAPILLARY: Glucose-Capillary: 97 mg/dL (ref 70–99)

## 2011-09-07 MED ORDER — TEMAZEPAM 15 MG PO CAPS
30.0000 mg | ORAL_CAPSULE | Freq: Every evening | ORAL | Status: DC | PRN
  Administered 2011-09-07: 30 mg via ORAL
  Filled 2011-09-07: qty 2

## 2011-09-07 NOTE — Consult Note (Signed)
[facility] CONSULT Reason for consult: GI bleeding Referring Physician: Dr. Fuller Canada Richard Arnold is an 60 y.o. male.  HPI: Patient is having a lot of problems with arthritic complaints from previous shoulder surgery. He has been taking Naprosyn on regular basis. He has not really had any abdominal pain had melenic stools and presented to the emergency room. He has not had any visible blood in the stool. He did have colonoscopy by Dr. Laural Benes 7/10 revealing a rectal polyp. No AVMs or diverticuli were mentioned in the report.  Past Medical History  Diagnosis Date  . Arthritis   . Hypercholesteremia   . Turner syndrome   . Brachial neuritis     Past Surgical History  Procedure Date  . Shoulder arthroscopy 11    x4  one open rt  . Total shoulder arthroplasty 01/30/2011    Procedure: TOTAL SHOULDER ARTHROPLASTY;  Surgeon: Verlee Rossetti;  Location: [facility] OR;  Service: Orthopedics;  Laterality: Right;  right total shoulder atrhoplasty    History reviewed. No pertinent family history.  Social History:  reports that he has never smoked. He does not have any smokeless tobacco history on file. He reports that he does not drink alcohol or use illicit drugs.  Allergies:  Allergies  Allergen Reactions  . Monosodium Glutamate Swelling    Rash, throat swells  . Tylox (Oxycodone-Acetaminophen)     Pt states he had a "weird feeling"  . Latex Rash    Medications;    . sodium chloride   Intravenous Once  . atorvastatin  10 mg Oral q1800  . pantoprazole  40 mg Oral Q1200  . sodium chloride  3 mL Intravenous Q12H   PRN Meds ondansetron (ZOFRAN) IV, ondansetron, temazepam Results for orders placed during the hospital encounter of 09/05/11 (from the past 48 hour(s))  CBC WITH DIFFERENTIAL     Status: Abnormal   Collection Time   09/05/11  4:00 PM      Component Value Range Comment   WBC 12.0 (*) 4.0 - 10.5 K/uL    RBC 3.80 (*) 4.22 - 5.81 MIL/uL    Hemoglobin 11.6 (*) 13.0 -  17.0 g/dL    HCT 16.1 (*) 09.6 - 52.0 %    MCV 87.1  78.0 - 100.0 fL    MCH 30.5  26.0 - 34.0 pg    MCHC 35.0  30.0 - 36.0 g/dL    RDW 04.5  40.9 - 81.1 %    Platelets 184  150 - 400 K/uL    Neutrophils Relative 90 (*) 43 - 77 %    Neutro Abs 10.8 (*) 1.7 - 7.7 K/uL    Lymphocytes Relative 8 (*) 12 - 46 %    Lymphs Abs 0.9  0.7 - 4.0 K/uL    Monocytes Relative 2 (*) 3 - 12 %    Monocytes Absolute 0.2  0.1 - 1.0 K/uL    Eosinophils Relative 0  0 - 5 %    Eosinophils Absolute 0.0  0.0 - 0.7 K/uL    Basophils Relative 0  0 - 1 %    Basophils Absolute 0.0  0.0 - 0.1 K/uL   COMPREHENSIVE METABOLIC PANEL     Status: Abnormal   Collection Time   09/05/11  4:00 PM      Component Value Range Comment   Sodium 135  135 - 145 mEq/L    Potassium 5.0  3.5 - 5.1 mEq/L    Chloride 103  96 -  112 mEq/L    CO2 23  19 - 32 mEq/L    Glucose, Bld 122 (*) 70 - 99 mg/dL    BUN 56 (*) 6 - 23 mg/dL    Creatinine, Ser 1.61  0.50 - 1.35 mg/dL    Calcium 8.7  8.4 - 09.6 mg/dL    Total Protein 6.2  6.0 - 8.3 g/dL    Albumin 3.4 (*) 3.5 - 5.2 g/dL    AST 13  0 - 37 U/L    ALT 11  0 - 53 U/L    Alkaline Phosphatase 49  39 - 117 U/L    Total Bilirubin 0.4  0.3 - 1.2 mg/dL    GFR calc non Af Amer 88 (*) >90 mL/min    GFR calc Af Amer >90  >90 mL/min   PROTIME-INR     Status: Normal   Collection Time   09/05/11  4:00 PM      Component Value Range Comment   Prothrombin Time 13.9  11.6 - 15.2 seconds    INR 1.05  0.00 - 1.49   APTT     Status: Normal   Collection Time   09/05/11  4:00 PM      Component Value Range Comment   aPTT 27  24 - 37 seconds   TYPE AND SCREEN     Status: Normal   Collection Time   09/05/11  4:00 PM      Component Value Range Comment   ABO/RH(D) O POS      Antibody Screen NEG      Sample Expiration 09/08/2011     ABO/RH     Status: Normal   Collection Time   09/05/11  4:00 PM      Component Value Range Comment   ABO/RH(D) O POS     MAGNESIUM     Status: Normal   Collection  Time   09/05/11  4:00 PM      Component Value Range Comment   Magnesium 1.6  1.5 - 2.5 mg/dL   PHOSPHORUS     Status: Abnormal   Collection Time   09/05/11  4:00 PM      Component Value Range Comment   Phosphorus 1.9 (*) 2.3 - 4.6 mg/dL   TSH     Status: Normal   Collection Time   09/05/11  4:00 PM      Component Value Range Comment   TSH 0.426  0.350 - 4.500 uIU/mL   COMPREHENSIVE METABOLIC PANEL     Status: Abnormal   Collection Time   09/06/11  5:20 AM      Component Value Range Comment   Sodium 139  135 - 145 mEq/L    Potassium 4.2  3.5 - 5.1 mEq/L SLIGHT HEMOLYSIS   Chloride 108  96 - 112 mEq/L    CO2 24  19 - 32 mEq/L    Glucose, Bld 104 (*) 70 - 99 mg/dL    BUN 43 (*) 6 - 23 mg/dL    Creatinine, Ser 0.45  0.50 - 1.35 mg/dL    Calcium 8.6  8.4 - 40.9 mg/dL    Total Protein 5.7 (*) 6.0 - 8.3 g/dL    Albumin 3.1 (*) 3.5 - 5.2 g/dL    AST 18  0 - 37 U/L SLIGHT HEMOLYSIS   ALT 11  0 - 53 U/L    Alkaline Phosphatase 40  39 - 117 U/L    Total Bilirubin 0.4  0.3 - 1.2 mg/dL  GFR calc non Af Amer 87 (*) >90 mL/min    GFR calc Af Amer >90  >90 mL/min   CBC     Status: Abnormal   Collection Time   09/06/11  5:20 AM      Component Value Range Comment   WBC 7.9  4.0 - 10.5 K/uL    RBC 3.29 (*) 4.22 - 5.81 MIL/uL    Hemoglobin 10.0 (*) 13.0 - 17.0 g/dL    HCT 16.1 (*) 09.6 - 52.0 %    MCV 86.3  78.0 - 100.0 fL    MCH 30.4  26.0 - 34.0 pg    MCHC 35.2  30.0 - 36.0 g/dL    RDW 04.5  40.9 - 81.1 %    Platelets 170  150 - 400 K/uL   GLUCOSE, CAPILLARY     Status: Abnormal   Collection Time   09/06/11  7:48 AM      Component Value Range Comment   Glucose-Capillary 121 (*) 70 - 99 mg/dL    Comment 1 Documented in Chart      Comment 2 Notify RN     CBC     Status: Abnormal   Collection Time   09/06/11  2:54 PM      Component Value Range Comment   WBC 6.5  4.0 - 10.5 K/uL    RBC 3.54 (*) 4.22 - 5.81 MIL/uL    Hemoglobin 10.7 (*) 13.0 - 17.0 g/dL    HCT 91.4 (*) 78.2 - 52.0 %     MCV 86.4  78.0 - 100.0 fL    MCH 30.2  26.0 - 34.0 pg    MCHC 35.0  30.0 - 36.0 g/dL    RDW 95.6  21.3 - 08.6 %    Platelets 182  150 - 400 K/uL   GLUCOSE, CAPILLARY     Status: Normal   Collection Time   09/07/11  7:34 AM      Component Value Range Comment   Glucose-Capillary 97  70 - 99 mg/dL   CBC     Status: Abnormal   Collection Time   09/07/11  8:56 AM      Component Value Range Comment   WBC 5.5  4.0 - 10.5 K/uL    RBC 3.40 (*) 4.22 - 5.81 MIL/uL    Hemoglobin 10.2 (*) 13.0 - 17.0 g/dL    HCT 57.8 (*) 46.9 - 52.0 %    MCV 86.8  78.0 - 100.0 fL    MCH 30.0  26.0 - 34.0 pg    MCHC 34.6  30.0 - 36.0 g/dL    RDW 62.9  52.8 - 41.3 %    Platelets 191  150 - 400 K/uL     No results found.           Blood pressure 108/65, pulse 82, temperature 98.1 F (36.7 C), temperature source Oral, resp. rate 20, height 5\' 8"  (1.727 m), weight 84.1 kg (185 lb 6.5 oz), SpO2 96.00%.  Physical exam:  General-alert white male in no acute distress sclera nonicteric Neck-supple Heart-regular in rhythm without murmurs rubs Lungs-clear Abdomen-soft nontender nondistended with good bowel sounds  Assessment: 1. GI bleed. This is likely an upper GI bleed related to NSAIDs. Patient has had previous colonoscopy in the past several years that was negative. I think an EGD in the morning should be performed  Plan: 1. We'll plan EGD in the morning and go ahead and give full liquids today. If EGD does  not reveal actively bleeding ulcer in his hemoglobin is stable, he could likely be discharged in the very near future.   Braiden Rodman JR,Joshia L 09/07/2011, 1:30 PM

## 2011-09-07 NOTE — Progress Notes (Signed)
Subjective: Patient denies nausea vomiting, no abdominal pain. He did have a dark stool yesterday. On Saturday he felt woozy diaphoretic had dark spell and then a syncopal spell hitting his head. There's been no recurrence of syncope. Since admission his hemoglobin has dropped 2 g. He admits to using NSAIDs full week. No prior history of ulcer or GI bleed. Diet has been resumed yesterday however etiology of GI bleed has not been defined  Objective: Vital signs in last 24 hours: Temp:  [98.1 F (36.7 C)-98.7 F (37.1 C)] 98.1 F (36.7 C) (07/29 0500) Pulse Rate:  [82-89] 82  (07/29 0500) Resp:  [20] 20  (07/29 0500) BP: (108-123)/(65-81) 108/65 mmHg (07/29 0500) SpO2:  [96 %-100 %] 96 % (07/29 0500) Weight:  [84.1 kg (185 lb 6.5 oz)-86.9 kg (191 lb 9.3 oz)] 84.1 kg (185 lb 6.5 oz) (07/29 0714) Weight change: 0.4 kg (14.1 oz) Last BM Date: 09/06/11  Intake/Output from previous day: 07/28 0701 - 07/29 0700 In: 1320 [P.O.:1320] Out: -  Intake/Output this shift: Total I/O In: 360 [P.O.:360] Out: -   General appearance: alert and cooperative Resp: clear to auscultation bilaterally Cardio: regular rate and rhythm, S1, S2 normal, no murmur, click, rub or gallop GI: soft, non-tender; bowel sounds normal; no masses,  no organomegaly  Lab Results:  Results for orders placed during the hospital encounter of 09/05/11 (from the past 24 hour(s))  CBC     Status: Abnormal   Collection Time   09/06/11  2:54 PM      Component Value Range   WBC 6.5  4.0 - 10.5 K/uL   RBC 3.54 (*) 4.22 - 5.81 MIL/uL   Hemoglobin 10.7 (*) 13.0 - 17.0 g/dL   HCT 08.6 (*) 57.8 - 46.9 %   MCV 86.4  78.0 - 100.0 fL   MCH 30.2  26.0 - 34.0 pg   MCHC 35.0  30.0 - 36.0 g/dL   RDW 62.9  52.8 - 41.3 %   Platelets 182  150 - 400 K/uL      Studies/Results: No results found.  Medications:  Prior to Admission:  Prescriptions prior to admission  Medication Sig Dispense Refill  . Ascorbic Acid (VITAMIN C PO)  Take 1 tablet by mouth daily.        Marland Kitchen aspirin EC 81 MG tablet Take 81 mg by mouth daily.        Marland Kitchen atorvastatin (LIPITOR) 10 MG tablet Take 10 mg by mouth daily.        . fish oil-omega-3 fatty acids 1000 MG capsule Take 2 g by mouth daily.       . naproxen sodium (ANAPROX) 220 MG tablet Take 220 mg by mouth 2 (two) times daily with a meal.      . temazepam (RESTORIL) 15 MG capsule Take 15 mg by mouth at bedtime as needed.       Scheduled:   . sodium chloride   Intravenous Once  . atorvastatin  10 mg Oral q1800  . pantoprazole  40 mg Oral Q1200  . sodium chloride  3 mL Intravenous Q12H   Continuous:   Assessment/Plan: Upper GI bleed, presenting with syncope, black stool, 2 g drop in hemoglobin. Recommend inpatient GI evaluation to define etiology.  LOS: 2 days   Delana Manganello D 09/07/2011, 8:34 AM

## 2011-09-07 NOTE — Care Management Note (Signed)
    Page 1 of 1   09/08/2011     2:29:51 PM   CARE MANAGEMENT NOTE 09/08/2011  Patient:  Richard Arnold, Richard Arnold   Account Number:  [id number]  Date Initiated:  09/07/2011  Documentation initiated by:  Lanier Clam  Subjective/Objective Assessment:   ADMITTED W/BLACK TARRY STOOL     Action/Plan:   FROM HOME W/FAMILY   Anticipated DC Date:  09/08/2011   Anticipated DC Plan:  HOME/SELF CARE      DC Planning Services  CM consult      Choice offered to / List presented to:             Status of service:  Completed, signed off Medicare Important Message given?   (If response is "NO", the following Medicare IM given date fields will be blank) Date Medicare IM given:   Date Additional Medicare IM given:    Discharge Disposition:  HOME/SELF CARE  Per UR Regulation:  Reviewed for med. necessity/level of care/duration of stay  If discussed at Long Length of Stay Meetings, dates discussed:    Comments:  09/07/11 [facility] RN,BSN NCM [phone]

## 2011-09-08 ENCOUNTER — Encounter (HOSPITAL_COMMUNITY)
Admission: EM | Disposition: A | Discharge status: Home / Self Care | Payer: Self-pay | Source: Home / Self Care | Attending: Internal Medicine

## 2011-09-08 ENCOUNTER — Encounter (HOSPITAL_COMMUNITY): Payer: Self-pay | Admitting: *Deleted

## 2011-09-08 HISTORY — PX: ESOPHAGOGASTRODUODENOSCOPY: SHX5428

## 2011-09-08 LAB — GLUCOSE, CAPILLARY: Glucose-Capillary: 106 mg/dL — ABNORMAL HIGH (ref 70–99)

## 2011-09-08 SURGERY — EGD (ESOPHAGOGASTRODUODENOSCOPY)
Anesthesia: Moderate Sedation

## 2011-09-08 MED ORDER — BUTAMBEN-TETRACAINE-BENZOCAINE 2-2-14 % EX AERO
INHALATION_SPRAY | CUTANEOUS | Status: DC | PRN
  Administered 2011-09-08: 2 via TOPICAL

## 2011-09-08 MED ORDER — FENTANYL CITRATE 0.05 MG/ML IJ SOLN
INTRAMUSCULAR | Status: AC
  Filled 2011-09-08: qty 4

## 2011-09-08 MED ORDER — FENTANYL CITRATE 0.05 MG/ML IJ SOLN
INTRAMUSCULAR | Status: DC | PRN
  Administered 2011-09-08 (×4): 25 ug via INTRAVENOUS

## 2011-09-08 MED ORDER — PANTOPRAZOLE SODIUM 40 MG PO TBEC: 40.0000 mg | DELAYED_RELEASE_TABLET | Freq: Every day | ORAL | Status: DC

## 2011-09-08 MED ORDER — MIDAZOLAM HCL 10 MG/2ML IJ SOLN
INTRAMUSCULAR | Status: DC | PRN
  Administered 2011-09-08: 2 mg via INTRAVENOUS
  Administered 2011-09-08: 1 mg via INTRAVENOUS
  Administered 2011-09-08: 2 mg via INTRAVENOUS
  Administered 2011-09-08: 1 mg via INTRAVENOUS
  Administered 2011-09-08: 2 mg via INTRAVENOUS

## 2011-09-08 MED ORDER — MIDAZOLAM HCL 10 MG/2ML IJ SOLN
INTRAMUSCULAR | Status: AC
  Filled 2011-09-08: qty 4

## 2011-09-08 NOTE — Op Note (Signed)
[facility] [address] [place], [place]  [address]  ENDOSCOPY PROCEDURE REPORT  PATIENT:  Richard Arnold, Richard Arnold  MR#:  [id number] BIRTHDATE:  1950/05/29, 61 yrs. old  GENDER:  male  ENDOSCOPIST:  Carman Ching Referred by:  Renford Dills, M.D.  PROCEDURE DATE:  09/08/2011 PROCEDURE:  EGD with biopsy, 43239 ASA CLASS:  Class II INDICATIONS:  hemorrhage of GI tract  MEDICATIONS:   Fentanyl 100 mcg IV, Versed 8 mg IV, Cetacaine spray x 2 TOPICAL ANESTHETIC:  Cetacaine Spray  DESCRIPTION OF PROCEDURE:   After the risks and benefits of the procedure were explained, informed consent was obtained.  The Pentax Gastroscope [id number] endoscope was introduced through the mouth and advanced to the second portion of the duodenum.  The instrument was slowly withdrawn as the mucosa was fully examined. <<PROCEDUREIMAGES>>  Moderate gastritis was found in the antrum. there was mild antral gastritis and mild duodenitis but no ulceration and no active bleeding. Biopsies were obtained to evaluate for Helicobacter. Biopsies of the antrum and body of the stomach were obtained and sent to pathology. sent to evaluate for Helicobacter  Duodenitis was found in the bulb of the duodenum.  nl esophagus. Retroflexed views revealed no abnormalities.    The scope was then withdrawn from the patient and the procedure completed.  COMPLICATIONS:  A complication of none occurred on 09/08/2011 at.  ENDOSCOPIC IMPRESSION: 1) Moderate gastritis in the antrum 2) Duodenitis in the bulb of duodenum 3) Nl esophagus patient likely bled from mild gastritis due to NSAIDs. There is no active bleeding and his hemoglobin is stable. RECOMMENDATIONS: 1) Nexium 40 mg would discharge on Nexium or PPI of choice. Avoid NSAIDs. We'll check results of biopsy for H. pylori. Should be able to be discharged today.  ______________________________ Carman Ching  CC:  Renford Dills MD  n. Rosalie DoctorCarman Ching at  09/08/2011 08:40 AM  Henrietta Hoover, [phone]

## 2011-09-08 NOTE — Interval H&P Note (Signed)
History and Physical Interval Note:  09/08/2011 8:08 AM  Richard Arnold  has presented today for surgery, with the diagnosis of GI bleed  The various methods of treatment have been discussed with the patient and family. After consideration of risks, benefits and other options for treatment, the patient has consented to  Procedure(s) (LRB): ESOPHAGOGASTRODUODENOSCOPY (EGD) (N/A) as a surgical intervention .  The patient's history has been reviewed, patient examined, no change in status, stable for surgery.  I have reviewed the patient's chart and labs.  Questions were answered to the patient's satisfaction.     Mervil Wacker JR,Tedford L

## 2011-09-08 NOTE — H&P (View-Only) (Signed)
Patient ID: Richard Arnold, male   DOB: 01/15/1951, 61 y.o.   MRN: [id number] [facility] PROGRESS NOTE  Richard Arnold NWG:[id number] DOB: November 23, 1950 DOA: 09/05/2011 PCP: Katy Apo, MD  Brief narrative: 61 year old male with history of dyslipidemia who presents for evaluation of black stools noticed 1 day prior to this admission. Patient reports using naproxen for pain occasionally and aspirin for heart disease prevention. Patient reports no visible blood in stool. Patient reported no associated abdominal pain, no nausea or vomiting. He did experience mild dizziness on one occasion prior to arrival to [facility] ED, otherwise no lightheadedness or loss of consciousness. No complaints of chest pain, no shortness of breath, no fever or chills.   Assessment and Plan:   Principal Problem:  *Acute blood loss anemia  Secondary to upper GI bleed secondary to aspirin and naproxen  CBC this am 10, slight drop since admission  patient remains hemodynamically stable Will recheck Hgb this afternoon  Continue Protonix 40 mg PO Q 12 hours HOLD aspirin and naproxen  Active Problems:  Dyslipidemia  Continue atorvastatin Diet  NPO or advance as tolerated DVT prophylaxis  SCD bilaterally   Manson Passey, MD  [facility] Pager [phone]  If 7PM-7AM, please contact night-coverage [email or web address] Password [facility] 09/06/2011, 7:16 AM   LOS: 1 day   HPI/Subjective: No acute events overnight. Patient did not have another BM so not sure if he continues to have black stool.  Objective: Filed Vitals:   09/05/11 1736 09/05/11 2230 09/06/11 0625 09/06/11 0656  BP: 100/70 119/72 109/70   Pulse: 100 94 80   Temp: 98.2 F (36.8 C) 98 F (36.7 C) 98.2 F (36.8 C)   TempSrc: Oral Oral Oral   Resp: 18 20 18    Height: 5\' 8"  (1.727 m)     Weight: 190 lb 11.2 oz (86.5 kg)   186 lb 11.7 oz (84.7 kg)  SpO2: 98% 97% 98%     Intake/Output Summary (Last 24 hours) at 09/06/11 0716 Last data  filed at 09/05/11 1856  Gross per 24 hour  Intake      0 ml  Output    300 ml  Net   -300 ml    Exam:   General:  Pt is alert, follows commands appropriately, not in acute distress  Cardiovascular: Regular rate and rhythm, S1/S2, no murmurs, no rubs, no gallops  Respiratory: Clear to auscultation bilaterally, no wheezing, no crackles, no rhonchi  Abdomen: Soft, non tender, non distended, bowel sounds present, no guarding  Extremities: No edema, pulses DP and PT palpable bilaterally  Neuro: Grossly nonfocal  Data Reviewed: Basic Metabolic Panel:  Lab 09/06/11 2952 09/05/11 1600  NA 139 135  K 4.2 5.0  CL 108 103  CO2 24 23  GLUCOSE 104* 122*  BUN 43* 56*  CREATININE 0.94 0.91  CALCIUM 8.6 8.7  MG -- 1.6  PHOS -- 1.9*   Liver Function Tests:  Lab 09/06/11 0520 09/05/11 1600  AST 18 13  ALT 11 11  ALKPHOS 40 49  BILITOT 0.4 0.4  PROT 5.7* 6.2  ALBUMIN 3.1* 3.4*   CBC:  Lab 09/06/11 0520 09/05/11 1600  WBC 7.9 12.0*  HGB 10.0* 11.6*  HCT 28.4* 33.1*  MCV 86.3 87.1  PLT 170 184    Studies: No results found.  Scheduled Meds:   . atorvastatin  10 mg Oral q1800  . pantoprazole (PROTONIX) IV  40 mg Intravenous Q12H   Continuous Infusions:

## 2011-09-08 NOTE — Discharge Summary (Signed)
Physician Discharge Summary  Patient ID: Richard Arnold MRN: [id number] DOB/AGE: February 03, 1951 61 y.o.  Admit date: 09/05/2011 Discharge date: 09/08/2011  Admission Diagnoses: GI bleed Discharge Diagnoses:  Principal Problem:  *Acute blood loss anemia  hypercholesterolemia Discharged Condition: stable  Hospital Course:  Patient presented to the hospital for evaluation of GI bleed, he had a black stool at home he a history of end-stage use. Prior to presenting to the hospital he felt dizzy and had a syncopal spell. In the emergency room he was evaluated, admission was deemed necessary for further evaluation and treatment. Please see dictated H&P for further details of past medical history medications social history past surgical history allergies family history. Patient was admitted to the medicine floor bed get serial H&H, his hemoglobin trended down to 10. He had a one dark stool while in the hospital. There was no nausea no vomiting no abdominal pain. There was no indication for transfusion. Patient underwent EGD that did not reveal any active bleeding he did show some duodenitis and esophagitis resume NSAID-induced. Biopsies for H. Pylori were obtained. Currently the patient is tolerating diet, he is at his baseline level of function, he is aware of the need to avoid NSAIDs, he will continue a PPI and have followup in the office in one week  Consults: Treatment Team:  Vertell Novak., MD  Significant Diagnostic Studies:No results found.  EGD reveal duodenitis and esophagitis Discharge hemoglobin 10  Discharge Exam: Blood pressure 113/71, pulse 79, temperature 97.7 F (36.5 C), temperature source Oral, resp. rate 18, height 5\' 8"  (1.727 m), weight 83.598 kg (184 lb 4.8 oz), SpO2 98.00%. General appearance: alert and cooperative Cardio: regular rate and rhythm, S1, S2 normal, no murmur, click, rub or gallop GI: soft, non-tender; bowel sounds normal; no masses,  no  organomegaly  Disposition: 01-Home or Self Care   Medication List  As of 09/08/2011  1:35 PM   STOP taking these medications         aspirin EC 81 MG tablet      naproxen sodium 220 MG tablet         TAKE these medications         atorvastatin 10 MG tablet   Commonly known as: LIPITOR   Take 10 mg by mouth daily.      fish oil-omega-3 fatty acids 1000 MG capsule   Take 2 g by mouth daily.      pantoprazole 40 MG tablet   Commonly known as: PROTONIX   Take 1 tablet (40 mg total) by mouth daily at 12 noon.      temazepam 15 MG capsule   Commonly known as: RESTORIL   Take 15 mg by mouth at bedtime as needed.      VITAMIN C PO   Take 1 tablet by mouth daily.           Follow-up Information    Follow up with Richard Mulgrew D, MD in 1 week.   Contact information:   [address] [place] [address] [phone]          Signed: Katy Apo 09/08/2011, 1:35 PM

## 2011-09-08 NOTE — Progress Notes (Signed)
Patient discharged home with wife. Discharge instructions given and explained to patient and her verbalized understanding. Patient denies any pain or distress, skin intact, no open wound. Accompanied home by wife, transported to the care via wheelchair.

## 2011-09-09 ENCOUNTER — Encounter (HOSPITAL_COMMUNITY): Payer: Self-pay

## 2011-09-09 ENCOUNTER — Encounter (HOSPITAL_COMMUNITY): Payer: Self-pay | Admitting: Gastroenterology

## 2013-08-22 ENCOUNTER — Encounter (INDEPENDENT_AMBULATORY_CARE_PROVIDER_SITE_OTHER): Payer: Self-pay | Admitting: Surgery

## 2013-08-22 ENCOUNTER — Ambulatory Visit (INDEPENDENT_AMBULATORY_CARE_PROVIDER_SITE_OTHER): Payer: Medicare HMO | Admitting: Surgery

## 2013-08-22 VITALS — BP 136/80 | HR 78 | Temp 97.0°F | Ht 68.0 in | Wt 196.0 lb

## 2013-08-22 DIAGNOSIS — G545 Neuralgic amyotrophy: Secondary | ICD-10-CM | POA: Insufficient documentation

## 2013-08-22 DIAGNOSIS — K4090 Unilateral inguinal hernia, without obstruction or gangrene, not specified as recurrent: Secondary | ICD-10-CM | POA: Insufficient documentation

## 2013-08-22 NOTE — Patient Instructions (Signed)
Inguinal Hernia, Adult  °Care After °Refer to this sheet in the next few weeks. These discharge instructions provide you with general information on caring for yourself after you leave the hospital. Your caregiver may also give you specific instructions. Your treatment has been planned according to the most current medical practices available, but unavoidable complications sometimes occur. If you have any problems or questions after discharge, please call your caregiver. °HOME CARE INSTRUCTIONS °· Put ice on the operative site. °¨ Put ice in a plastic bag. °¨ Place a towel between your skin and the bag. °¨ Leave the ice on for 15-20 minutes at a time, 03-04 times a day while awake. °· Change bandages (dressings) as directed. °· Keep the wound dry and clean. The wound may be washed gently with soap and water. Gently blot or dab the wound dry. It is okay to take showers 24 to 48 hours after surgery. Do not take baths, use swimming pools, or use hot tubs for 10 days, or as directed by your caregiver. °· Only take over-the-counter or prescription medicines for pain, discomfort, or fever as directed by your caregiver. °· Continue your normal diet as directed. °· Do not lift anything more than 10 pounds or play contact sports for 3 weeks, or as directed. °SEEK MEDICAL CARE IF: °· There is redness, swelling, or increasing pain in the wound. °· There is fluid (pus) coming from the wound. °· There is drainage from a wound lasting longer than 1 day. °· You have an oral temperature above 102° F (38.9° C). °· You notice a bad smell coming from the wound or dressing. °· The wound breaks open after the stitches (sutures) have been removed. °· You notice increasing pain in the shoulders (shoulder strap areas). °· You develop dizzy episodes or fainting while standing. °· You feel sick to your stomach (nauseous) or throw up (vomit). °SEEK IMMEDIATE MEDICAL CARE IF: °· You develop a rash. °· You have difficulty breathing. °· You  develop a reaction or have side effects to medicines you were given. °MAKE SURE YOU:  °· Understand these instructions. °· Will watch your condition. °· Will get help right away if you are not doing well or get worse. °Document Released: 02/26/2006 Document Revised: 04/20/2011 Document Reviewed: 12/26/2008 °[employer]® Patient Information ©2015 [employer]. This information is not intended to replace advice given to you by your health care provider. Make sure you discuss any questions you have with your health care provider. ° °

## 2013-08-22 NOTE — Progress Notes (Signed)
Chief Complaint:  New right inguinal hernia  History of Present Illness:  Richard Arnold is an 63 y.o. male who presents with a 5 week history of right groin pain and a bulge. He noticed that when he was gardening. It was worse when he went deep C. Fishing and was bouncing and the boat.  I discussed repair both laparoscopic and open in some detail. I think an open right inguinal hernia would be best for him and we will move forward with that plan as an outpatient. I gave him a booklet on right inguinal hernia repair. He is aware of the risk of nerve injury and numbness after surgery. He is aware of the risk of recurrence. We'll proceed with scheduling as an outpatient.  Past Medical History  Diagnosis Date  . Arthritis   . Hypercholesteremia   . Turner syndrome   . Brachial neuritis     Past Surgical History  Procedure Laterality Date  . Shoulder arthroscopy  11    x4  one open rt  . Total shoulder arthroplasty  01/30/2011    Procedure: TOTAL SHOULDER ARTHROPLASTY;  Surgeon: Augustin Schooling;  Location: [place];  Service: Orthopedics;  Laterality: Right;  right total shoulder atrhoplasty  . Esophagogastroduodenoscopy  09/08/2011    Procedure: ESOPHAGOGASTRODUODENOSCOPY (EGD);  Surgeon: Winfield Cunas., MD;  Location: [facility];  Service: Endoscopy;  Laterality: N/A;    Current Outpatient Prescriptions  Medication Sig Dispense Refill  . Ascorbic Acid (VITAMIN C PO) Take 1 tablet by mouth daily.        Marland Kitchen atorvastatin (LIPITOR) 10 MG tablet Take 10 mg by mouth daily.        . fish oil-omega-3 fatty acids 1000 MG capsule Take 2 g by mouth daily.       . temazepam (RESTORIL) 15 MG capsule Take 15 mg by mouth at bedtime as needed.      . pantoprazole (PROTONIX) 40 MG tablet Take 1 tablet (40 mg total) by mouth daily at 12 noon.  30 tablet  0   No current facility-administered medications for this visit.   Monosodium glutamate; Tylox; and Latex Family History  Problem Relation Age of  Onset  . Heart disease Mother   . Heart disease Father    Social History:   reports that he has never smoked. He does not have any smokeless tobacco history on file. He reports that he does not drink alcohol or use illicit drugs.   REVIEW OF SYSTEMS : Positive for history of left upper extremity exercise neuropathy ; otherwise negative  Physical Exam:   Blood pressure 136/80, pulse 78, temperature 97 F (36.1 C), height 5\' 8"  (1.727 m), weight 196 lb (88.905 kg). Body mass index is 29.81 kg/(m^2).  Gen:  WDWN white male NAD  Neurological: Alert and oriented to person, place, and time. Motor and sensory function is grossly intact  Head: Normocephalic and atraumatic.  Eyes: Conjunctivae are normal. Pupils are equal, round, and reactive to light. No scleral icterus.  Neck: Normal range of motion. Neck supple. No tracheal deviation or thyromegaly present.  Cardiovascular:  SR without murmurs or gallops.  No carotid bruits Breast:  Not examined Respiratory: Effort normal.  No respiratory distress. No chest wall tenderness. Breath sounds normal.  No wheezes, rales or rhonchi.  Abdomen:  Muscular nontender GU:  Prominent reducible right inguinal hernia. No left inguinal hernia. Testes without masses. Musculoskeletal: Normal range of motion. Extremities are nontender. No cyanosis, edema or clubbing  noted Lymphadenopathy: No cervical, preauricular, postauricular or axillary adenopathy is present Skin: Skin is warm and dry. No rash noted. No diaphoresis. No erythema. No pallor. Pscyh: Normal mood and affect. Behavior is normal. Judgment and thought content normal.   LABORATORY RESULTS: No results found for this or any previous visit (from the past 48 hour(s)).   RADIOLOGY RESULTS: No results found.  Problem List: Patient Active Problem List   Diagnosis Date Noted  . Parson[place]-Turner syndrome-left upper extremity 08/22/2013  . Right inguinal hernia 08/22/2013  . Acute blood loss anemia  09/05/2011  . Osteoarthrosis, unspecified whether generalized or localized, shoulder region 01/30/2011    Assessment & Plan: Right inguinal hernia. Plan open repair with mesh. We'll schedule as an outpatient.    Matt B. Hassell Done, MD, [facility] [phone] beeper [phone]  08/22/2013 10:01 AM

## 2013-08-29 ENCOUNTER — Encounter (HOSPITAL_COMMUNITY): Payer: Self-pay | Admitting: *Deleted

## 2013-08-29 ENCOUNTER — Encounter (HOSPITAL_COMMUNITY): Payer: Self-pay | Admitting: Pharmacy Technician

## 2013-08-29 ENCOUNTER — Other Ambulatory Visit (HOSPITAL_COMMUNITY): Payer: Self-pay | Admitting: *Deleted

## 2013-08-30 ENCOUNTER — Encounter (HOSPITAL_COMMUNITY)
Admission: RE | Disposition: A | Discharge status: Home / Self Care | Payer: Self-pay | Source: Ambulatory Visit | Attending: Surgery

## 2013-08-30 ENCOUNTER — Encounter (HOSPITAL_COMMUNITY): Payer: Self-pay | Admitting: *Deleted

## 2013-08-30 ENCOUNTER — Ambulatory Visit (HOSPITAL_COMMUNITY): Payer: Medicare HMO | Admitting: Anesthesiology

## 2013-08-30 ENCOUNTER — Ambulatory Visit (HOSPITAL_COMMUNITY)
Admission: RE | Admit: 2013-08-30 | Discharge: 2013-08-30 | Disposition: A | Discharge status: Home / Self Care | Payer: Medicare HMO | Source: Ambulatory Visit | Attending: Surgery | Admitting: Surgery

## 2013-08-30 ENCOUNTER — Encounter (HOSPITAL_COMMUNITY): Payer: Medicare HMO | Admitting: Anesthesiology

## 2013-08-30 DIAGNOSIS — Z01812 Encounter for preprocedural laboratory examination: Secondary | ICD-10-CM | POA: Diagnosis not present

## 2013-08-30 DIAGNOSIS — E7800 Pure hypercholesterolemia, unspecified: Secondary | ICD-10-CM | POA: Diagnosis not present

## 2013-08-30 DIAGNOSIS — K4090 Unilateral inguinal hernia, without obstruction or gangrene, not specified as recurrent: Secondary | ICD-10-CM | POA: Insufficient documentation

## 2013-08-30 DIAGNOSIS — M5412 Radiculopathy, cervical region: Secondary | ICD-10-CM | POA: Diagnosis not present

## 2013-08-30 DIAGNOSIS — Z9889 Other specified postprocedural states: Secondary | ICD-10-CM

## 2013-08-30 DIAGNOSIS — Z79899 Other long term (current) drug therapy: Secondary | ICD-10-CM | POA: Insufficient documentation

## 2013-08-30 DIAGNOSIS — Z8719 Personal history of other diseases of the digestive system: Secondary | ICD-10-CM

## 2013-08-30 HISTORY — PX: INGUINAL HERNIA REPAIR: SHX194

## 2013-08-30 LAB — BASIC METABOLIC PANEL
Anion gap: 11 (ref 5–15)
BUN: 20 mg/dL (ref 6–23)
CO2: 26 mEq/L (ref 19–32)
Calcium: 9.5 mg/dL (ref 8.4–10.5)
Chloride: 102 mEq/L (ref 96–112)
Creatinine, Ser: 1.01 mg/dL (ref 0.50–1.35)
GFR calc Af Amer: 88 mL/min — ABNORMAL LOW (ref >90)
GFR calc non Af Amer: 76 mL/min — ABNORMAL LOW (ref >90)
GLUCOSE: 99 mg/dL (ref 70–99)
Potassium: 4.5 mEq/L (ref 3.7–5.3)
Sodium: 139 mEq/L (ref 137–147)

## 2013-08-30 LAB — CBC
HCT: 44.2 % (ref 39.0–52.0)
Hemoglobin: 15 g/dL (ref 13.0–17.0)
MCH: 29.9 pg (ref 26.0–34.0)
MCHC: 33.9 g/dL (ref 30.0–36.0)
MCV: 88 fL (ref 78.0–100.0)
PLATELETS: 199 10*3/uL (ref 150–400)
RBC: 5.02 MIL/uL (ref 4.22–5.81)
RDW: 13 % (ref 11.5–15.5)
WBC: 6.6 10*3/uL (ref 4.0–10.5)

## 2013-08-30 SURGERY — REPAIR, HERNIA, INGUINAL, ADULT
Anesthesia: General | Site: Groin | Laterality: Right

## 2013-08-30 MED ORDER — FENTANYL CITRATE 0.05 MG/ML IJ SOLN
INTRAMUSCULAR | Status: AC
  Filled 2013-08-30: qty 2

## 2013-08-30 MED ORDER — HYDROCODONE-ACETAMINOPHEN 5-325 MG PO TABS: 1.0000 | ORAL_TABLET | ORAL | Status: DC | PRN

## 2013-08-30 MED ORDER — FENTANYL CITRATE 0.05 MG/ML IJ SOLN
INTRAMUSCULAR | Status: AC
  Filled 2013-08-30: qty 5

## 2013-08-30 MED ORDER — FENTANYL CITRATE 0.05 MG/ML IJ SOLN: 25.0000 ug | INTRAMUSCULAR | Status: DC | PRN

## 2013-08-30 MED ORDER — MORPHINE SULFATE 10 MG/ML IJ SOLN: 1.0000 mg | INTRAMUSCULAR | Status: DC | PRN

## 2013-08-30 MED ORDER — MIDAZOLAM HCL 5 MG/5ML IJ SOLN
INTRAMUSCULAR | Status: DC | PRN
  Administered 2013-08-30: 2 mg via INTRAVENOUS

## 2013-08-30 MED ORDER — 0.9 % SODIUM CHLORIDE (POUR BTL) OPTIME
TOPICAL | Status: DC | PRN
  Administered 2013-08-30: 1000 mL

## 2013-08-30 MED ORDER — CHLORHEXIDINE GLUCONATE 4 % EX LIQD: 1.0000 "application " | Freq: Once | CUTANEOUS | Status: DC

## 2013-08-30 MED ORDER — DEXAMETHASONE SODIUM PHOSPHATE 10 MG/ML IJ SOLN
INTRAMUSCULAR | Status: AC
  Filled 2013-08-30: qty 1

## 2013-08-30 MED ORDER — PROPOFOL 10 MG/ML IV BOLUS
INTRAVENOUS | Status: DC | PRN
  Administered 2013-08-30: 150 mg via INTRAVENOUS

## 2013-08-30 MED ORDER — MEPERIDINE HCL 50 MG/ML IJ SOLN: 6.2500 mg | INTRAMUSCULAR | Status: DC | PRN

## 2013-08-30 MED ORDER — PROMETHAZINE HCL 25 MG/ML IJ SOLN: 6.2500 mg | INTRAMUSCULAR | Status: DC | PRN

## 2013-08-30 MED ORDER — LACTATED RINGERS IV SOLN
INTRAVENOUS | Status: DC
  Administered 2013-08-30 (×2): 1000 mL via INTRAVENOUS

## 2013-08-30 MED ORDER — BUPIVACAINE HCL (PF) 0.5 % IJ SOLN
INTRAMUSCULAR | Status: DC | PRN
  Administered 2013-08-30: 10 mL

## 2013-08-30 MED ORDER — CEFAZOLIN SODIUM-DEXTROSE 2-3 GM-% IV SOLR
INTRAVENOUS | Status: AC
  Filled 2013-08-30: qty 50

## 2013-08-30 MED ORDER — HEPARIN SODIUM (PORCINE) 5000 UNIT/ML IJ SOLN
5000.0000 [IU] | Freq: Once | INTRAMUSCULAR | Status: AC
  Administered 2013-08-30: 5000 [IU] via SUBCUTANEOUS
  Filled 2013-08-30: qty 1

## 2013-08-30 MED ORDER — FENTANYL CITRATE 0.05 MG/ML IJ SOLN
INTRAMUSCULAR | Status: DC | PRN
  Administered 2013-08-30 (×4): 25 ug via INTRAVENOUS
  Administered 2013-08-30: 50 ug via INTRAVENOUS
  Administered 2013-08-30 (×2): 25 ug via INTRAVENOUS
  Administered 2013-08-30: 50 ug via INTRAVENOUS
  Administered 2013-08-30 (×2): 25 ug via INTRAVENOUS
  Administered 2013-08-30: 50 ug via INTRAVENOUS

## 2013-08-30 MED ORDER — ONDANSETRON HCL 4 MG/2ML IJ SOLN
INTRAMUSCULAR | Status: DC | PRN
  Administered 2013-08-30: 4 mg via INTRAVENOUS

## 2013-08-30 MED ORDER — SODIUM CHLORIDE 0.9 % IJ SOLN: 3.0000 mL | INTRAMUSCULAR | Status: DC | PRN

## 2013-08-30 MED ORDER — ACETAMINOPHEN 650 MG RE SUPP
650.0000 mg | RECTAL | Status: DC | PRN
  Filled 2013-08-30: qty 1

## 2013-08-30 MED ORDER — SODIUM CHLORIDE 0.9 % IJ SOLN: 3.0000 mL | Freq: Two times a day (BID) | INTRAMUSCULAR | Status: DC

## 2013-08-30 MED ORDER — ROCURONIUM BROMIDE 100 MG/10ML IV SOLN
INTRAVENOUS | Status: AC
  Filled 2013-08-30: qty 1

## 2013-08-30 MED ORDER — ONDANSETRON HCL 4 MG/2ML IJ SOLN
INTRAMUSCULAR | Status: AC
  Filled 2013-08-30: qty 2

## 2013-08-30 MED ORDER — BUPIVACAINE HCL (PF) 0.5 % IJ SOLN
INTRAMUSCULAR | Status: AC
  Filled 2013-08-30: qty 30

## 2013-08-30 MED ORDER — PROPOFOL 10 MG/ML IV BOLUS
INTRAVENOUS | Status: AC
  Filled 2013-08-30: qty 20

## 2013-08-30 MED ORDER — CEFAZOLIN SODIUM-DEXTROSE 2-3 GM-% IV SOLR
2.0000 g | INTRAVENOUS | Status: AC
  Administered 2013-08-30: 2 g via INTRAVENOUS

## 2013-08-30 MED ORDER — GLYCOPYRROLATE 0.2 MG/ML IJ SOLN
INTRAMUSCULAR | Status: AC
  Filled 2013-08-30: qty 3

## 2013-08-30 MED ORDER — OXYCODONE HCL 5 MG PO TABS
5.0000 mg | ORAL_TABLET | ORAL | Status: DC | PRN
  Administered 2013-08-30: 5 mg via ORAL
  Filled 2013-08-30: qty 1

## 2013-08-30 MED ORDER — DEXAMETHASONE SODIUM PHOSPHATE 10 MG/ML IJ SOLN
INTRAMUSCULAR | Status: DC | PRN
  Administered 2013-08-30: 10 mg via INTRAVENOUS

## 2013-08-30 MED ORDER — MIDAZOLAM HCL 2 MG/2ML IJ SOLN
INTRAMUSCULAR | Status: AC
  Filled 2013-08-30: qty 2

## 2013-08-30 MED ORDER — ACETAMINOPHEN 325 MG PO TABS: 650.0000 mg | ORAL_TABLET | ORAL | Status: DC | PRN

## 2013-08-30 MED ORDER — SODIUM CHLORIDE 0.9 % IV SOLN: 250.0000 mL | INTRAVENOUS | Status: DC | PRN

## 2013-08-30 MED ORDER — NEOSTIGMINE METHYLSULFATE 10 MG/10ML IV SOLN
INTRAVENOUS | Status: AC
  Filled 2013-08-30: qty 1

## 2013-08-30 SURGICAL SUPPLY — 36 items
BENZOIN TINCTURE PRP APPL 2/3 (GAUZE/BANDAGES/DRESSINGS) IMPLANT
BLADE HEX COATED 2.75 (ELECTRODE) ×2 IMPLANT
BLADE SURG 15 STRL LF DISP TIS (BLADE) ×1 IMPLANT
BLADE SURG 15 STRL SS (BLADE) ×1
CANISTER SUCTION 2500CC (MISCELLANEOUS) IMPLANT
DECANTER SPIKE VIAL GLASS SM (MISCELLANEOUS) IMPLANT
DISSECTOR ROUND CHERRY 3/8 STR (MISCELLANEOUS) IMPLANT
DRAIN PENROSE 18X1/2 LTX STRL (DRAIN) IMPLANT
DRAIN PENROSE LF 8X20.3CM SIL (WOUND CARE) ×2 IMPLANT
DRAPE LAPAROTOMY TRNSV 102X78 (DRAPE) ×2 IMPLANT
ELECT REM PT RETURN 9FT ADLT (ELECTROSURGICAL) ×2
ELECTRODE REM PT RTRN 9FT ADLT (ELECTROSURGICAL) ×1 IMPLANT
GAUZE SPONGE 4X4 12PLY STRL (GAUZE/BANDAGES/DRESSINGS) IMPLANT
GLOVE BIOGEL M 8.0 STRL (GLOVE) ×2 IMPLANT
GOWN SPEC L4 XLG W/TWL (GOWN DISPOSABLE) ×2 IMPLANT
GOWN STRL REUS W/TWL XL LVL3 (GOWN DISPOSABLE) ×2 IMPLANT
KIT BASIN OR (CUSTOM PROCEDURE TRAY) ×2 IMPLANT
MESH HERNIA SYS ULTRAPRO LRG (Mesh General) ×2 IMPLANT
NEEDLE HYPO 22GX1.5 SAFETY (NEEDLE) ×2 IMPLANT
PACK BASIC VI WITH GOWN DISP (CUSTOM PROCEDURE TRAY) ×2 IMPLANT
PENCIL BUTTON HOLSTER BLD 10FT (ELECTRODE) ×2 IMPLANT
SPONGE LAP 4X18 X RAY DECT (DISPOSABLE) ×2 IMPLANT
STAPLER VISISTAT 35W (STAPLE) ×2 IMPLANT
STRIP CLOSURE SKIN 1/2X4 (GAUZE/BANDAGES/DRESSINGS) IMPLANT
SUT MNCRL AB 4-0 PS2 18 (SUTURE) ×2 IMPLANT
SUT PROLENE 2 0 CT2 30 (SUTURE) ×8 IMPLANT
SUT SILK 2 0 SH (SUTURE) IMPLANT
SUT SILK 2 0 SH CR/8 (SUTURE) IMPLANT
SUT VIC AB 2-0 SH 27 (SUTURE) ×2
SUT VIC AB 2-0 SH 27X BRD (SUTURE) ×2 IMPLANT
SUT VIC AB 4-0 SH 18 (SUTURE) ×2 IMPLANT
SYR BULB IRRIGATION 50ML (SYRINGE) ×2 IMPLANT
SYRINGE 20CC LL (MISCELLANEOUS) ×2 IMPLANT
TOWEL OR 17X26 10 PK STRL BLUE (TOWEL DISPOSABLE) ×2 IMPLANT
TOWEL OR NON WOVEN STRL DISP B (DISPOSABLE) ×2 IMPLANT
YANKAUER SUCT BULB TIP 10FT TU (MISCELLANEOUS) ×2 IMPLANT

## 2013-08-30 NOTE — Op Note (Signed)
Surgeon: Kaylyn Lim, MD, FACS  Asst:  none  Anes:  Gen. By LMA  Procedure: Open right inguinal hernia repair with mesh  Diagnosis: Large right indirect inguinal hernia was smaller discrete medial inguinal hernia  Complications: none  EBL:   18 cc  Drains: none  Description of Procedure:  The patient was taken to OR 1 at [facility].  After anesthesia was administered and the patient was prepped a timeout was performed.  An oblique incision was made in the right inguinal region and carried down through generous adiposity to reveal the external oblique fascia. The fibers were incised and opened to the exteranal ring and with scissors and the cord was mobilized with a nonlatex drain. A very large mass was seen within the cord structures and after division of the cremasteric fibers proximally I was able to separate this from the cord structures and found a very large hernia sac with a sliding component of fat. Elected not to transect this because it no whether it could be a slider containing significant structures so I  ligated this high with a Vicryl and then tucked this back inside. I attempted closure of the internal ring it did not feel that this was then again with a secure closure to this very dilated ring.  I got an Ethicon ultra light set reducing the size of the internal component significantly and initially applied this I was not satisfied with the external component. After sewing the internal component to the edges of the ring went I went ahead and excised the excess. Also found medially a 1 cm direct hernia. I closed it off with a figure-of-eight suture of 2-0 Prolenel. Elected to reinforce the entire floor a piece of Marlex type mesh cut to fit and sutured along the inguinal ligament with a running 2-0 Prolene suture. It was sutured medially with a horizontal running mattress suture of 2-0 Prolene. The tails were cut and tucked beneath the external oblique and were sewn together with a  horizontal mattress suture of 2-0 Prolene. I then irrigated and injected the structures with Marcaine half percent. I then closed external oblique with a running 2-0 Vicryl. The wound was closed with 4-0 Vicryl subcutaneously and with a running subcuticular 5-0 Monocryl and liquid band adhesive dressing. Patient tolerated  the procedure well was taken to  recovery in satisfactory condition.  The patient tolerated the procedure well and was taken to the PACU in stable condition.     Matt B. Hassell Done, [place], [facility], [place]

## 2013-08-30 NOTE — Transfer of Care (Signed)
Immediate Anesthesia Transfer of Care Note  Patient: Richard Arnold  Procedure(s) Performed: Procedure(s): HERNIA REPAIR INGUINAL WITH MESH  ADULT (Right)  Patient Location: PACU  Anesthesia Type:General  Level of Consciousness: awake, alert , oriented and patient cooperative  Airway & Oxygen Therapy: Patient Spontanous Breathing and Patient connected to face mask oxygen  Post-op Assessment: Report given to PACU RN and Post -op Vital signs reviewed and stable  Post vital signs: Reviewed and stable  Complications: No apparent anesthesia complications

## 2013-08-30 NOTE — Anesthesia Preprocedure Evaluation (Signed)
Anesthesia Evaluation  Patient identified by MRN, date of birth, ID band Patient awake    Reviewed: Allergy & Precautions, H&P , NPO status , Patient's Chart, lab work & pertinent test results  Airway Mallampati: II TM Distance: >3 FB Neck ROM: Full    Dental no notable dental hx.    Pulmonary neg pulmonary ROS,  breath sounds clear to auscultation  Pulmonary exam normal       Cardiovascular negative cardio ROS  Rhythm:Regular Rate:Normal     Neuro/Psych Parsonage-turner syndrome  Neuromuscular disease negative psych ROS   GI/Hepatic negative GI ROS, Neg liver ROS,   Endo/Other  negative endocrine ROS  Renal/GU negative Renal ROS  negative genitourinary   Musculoskeletal negative musculoskeletal ROS (+)   Abdominal   Peds negative pediatric ROS (+)  Hematology negative hematology ROS (+)   Anesthesia Other Findings   Reproductive/Obstetrics negative OB ROS                           Anesthesia Physical Anesthesia Plan  ASA: II  Anesthesia Plan: General   Post-op Pain Management:    Induction: Intravenous  Airway Management Planned: LMA and Oral ETT  Additional Equipment:   Intra-op Plan:   Post-operative Plan: Extubation in OR  Informed Consent: I have reviewed the patients History and Physical, chart, labs and discussed the procedure including the risks, benefits and alternatives for the proposed anesthesia with the patient or authorized representative who has indicated his/her understanding and acceptance.   Dental advisory given  Plan Discussed with: CRNA  Anesthesia Plan Comments:         Anesthesia Quick Evaluation

## 2013-08-30 NOTE — Discharge Instructions (Signed)

## 2013-08-30 NOTE — H&P (View-Only) (Signed)
Chief Complaint:  New right inguinal hernia  History of Present Illness:  Richard Arnold is an 63 y.o. male who presents with a 5 week history of right groin pain and a bulge. He noticed that when he was gardening. It was worse when he went deep C. Fishing and was bouncing and the boat.  I discussed repair both laparoscopic and open in some detail. I think an open right inguinal hernia would be best for him and we will move forward with that plan as an outpatient. I gave him a booklet on right inguinal hernia repair. He is aware of the risk of nerve injury and numbness after surgery. He is aware of the risk of recurrence. We'll proceed with scheduling as an outpatient.  Past Medical History  Diagnosis Date  . Arthritis   . Hypercholesteremia   . Turner syndrome   . Brachial neuritis     Past Surgical History  Procedure Laterality Date  . Shoulder arthroscopy  11    x4  one open rt  . Total shoulder arthroplasty  01/30/2011    Procedure: TOTAL SHOULDER ARTHROPLASTY;  Surgeon: Augustin Schooling;  Location: [place];  Service: Orthopedics;  Laterality: Right;  right total shoulder atrhoplasty  . Esophagogastroduodenoscopy  09/08/2011    Procedure: ESOPHAGOGASTRODUODENOSCOPY (EGD);  Surgeon: Winfield Cunas., MD;  Location: [facility];  Service: Endoscopy;  Laterality: N/A;    Current Outpatient Prescriptions  Medication Sig Dispense Refill  . Ascorbic Acid (VITAMIN C PO) Take 1 tablet by mouth daily.        Marland Kitchen atorvastatin (LIPITOR) 10 MG tablet Take 10 mg by mouth daily.        . fish oil-omega-3 fatty acids 1000 MG capsule Take 2 g by mouth daily.       . temazepam (RESTORIL) 15 MG capsule Take 15 mg by mouth at bedtime as needed.      . pantoprazole (PROTONIX) 40 MG tablet Take 1 tablet (40 mg total) by mouth daily at 12 noon.  30 tablet  0   No current facility-administered medications for this visit.   Monosodium glutamate; Tylox; and Latex Family History  Problem Relation Age of  Onset  . Heart disease Mother   . Heart disease Father    Social History:   reports that he has never smoked. He does not have any smokeless tobacco history on file. He reports that he does not drink alcohol or use illicit drugs.   REVIEW OF SYSTEMS : Positive for history of left upper extremity exercise neuropathy ; otherwise negative  Physical Exam:   Blood pressure 136/80, pulse 78, temperature 97 F (36.1 C), height 5\' 8"  (1.727 m), weight 196 lb (88.905 kg). Body mass index is 29.81 kg/(m^2).  Gen:  WDWN white male NAD  Neurological: Alert and oriented to person, place, and time. Motor and sensory function is grossly intact  Head: Normocephalic and atraumatic.  Eyes: Conjunctivae are normal. Pupils are equal, round, and reactive to light. No scleral icterus.  Neck: Normal range of motion. Neck supple. No tracheal deviation or thyromegaly present.  Cardiovascular:  SR without murmurs or gallops.  No carotid bruits Breast:  Not examined Respiratory: Effort normal.  No respiratory distress. No chest wall tenderness. Breath sounds normal.  No wheezes, rales or rhonchi.  Abdomen:  Muscular nontender GU:  Prominent reducible right inguinal hernia. No left inguinal hernia. Testes without masses. Musculoskeletal: Normal range of motion. Extremities are nontender. No cyanosis, edema or clubbing  noted Lymphadenopathy: No cervical, preauricular, postauricular or axillary adenopathy is present Skin: Skin is warm and dry. No rash noted. No diaphoresis. No erythema. No pallor. Pscyh: Normal mood and affect. Behavior is normal. Judgment and thought content normal.   LABORATORY RESULTS: No results found for this or any previous visit (from the past 48 hour(s)).   RADIOLOGY RESULTS: No results found.  Problem List: Patient Active Problem List   Diagnosis Date Noted  . Parsonage-Turner syndrome-left upper extremity 08/22/2013  . Right inguinal hernia 08/22/2013  . Acute blood loss anemia  09/05/2011  . Osteoarthrosis, unspecified whether generalized or localized, shoulder region 01/30/2011    Assessment & Plan: Right inguinal hernia. Plan open repair with mesh. We'll schedule as an outpatient.    Matt B. Hassell Done, MD, [facility] [phone] beeper [phone]  08/22/2013 10:01 AM

## 2013-08-30 NOTE — Interval H&P Note (Signed)
History and Physical Interval Note:  08/30/2013 11:03 AM  Richard Arnold  has presented today for surgery, with the diagnosis of right inguinal hernia  The various methods of treatment have been discussed with the patient and family. After consideration of risks, benefits and other options for treatment, the patient has consented to  Procedure(s): HERNIA REPAIR INGUINAL ADULT (Right) as a surgical intervention .  The patient's history has been reviewed, patient examined, no change in status, stable for surgery.  I have reviewed the patient's chart and labs.  Questions were answered to the patient's satisfaction.     Vanilla Heatherington B

## 2013-08-31 ENCOUNTER — Encounter (HOSPITAL_COMMUNITY): Payer: Self-pay | Admitting: Surgery

## 2013-08-31 NOTE — Anesthesia Postprocedure Evaluation (Signed)
  Anesthesia Post-op Note  Patient: Richard Arnold  Procedure(s) Performed: Procedure(s) (LRB): HERNIA REPAIR INGUINAL WITH MESH  ADULT (Right)  Patient Location: PACU  Anesthesia Type: General  Level of Consciousness: awake and alert   Airway and Oxygen Therapy: Patient Spontanous Breathing  Post-op Pain: mild  Post-op Assessment: Post-op Vital signs reviewed, Patient's Cardiovascular Status Stable, Respiratory Function Stable, Patent Airway and No signs of Nausea or vomiting  Last Vitals:  Filed Vitals:   08/30/13 1525  BP: 126/80  Pulse: 93  Temp: 36.7 C  Resp: 17    Post-op Vital Signs: stable   Complications: No apparent anesthesia complications

## 2013-09-05 ENCOUNTER — Telehealth (INDEPENDENT_AMBULATORY_CARE_PROVIDER_SITE_OTHER): Payer: Self-pay

## 2013-09-05 DIAGNOSIS — G8918 Other acute postprocedural pain: Secondary | ICD-10-CM

## 2013-09-05 MED ORDER — HYDROCODONE-ACETAMINOPHEN 5-325 MG PO TABS: 1.0000 | ORAL_TABLET | ORAL | Status: DC | PRN

## 2013-09-05 NOTE — Telephone Encounter (Signed)
Called patient to make aware of rx for hydrocodone is ready for pick up

## 2013-09-05 NOTE — Telephone Encounter (Signed)
Patient calling into office requesting a refill on his pain medication (Hydrocodone) patient status post Inguinal Hernia Repair on 08/30/13.  Patient denies any fever, nausea or vomiting.  Patient aware message will be sent to Dr. Hassell Done for review and we will call patient to advise on refill.  Patient verbalized understanding.  Dr. Hassell Done approved refill for Hydrocodone 5/325mg , prescription sent to the Urgent Office for signature

## 2013-09-08 ENCOUNTER — Encounter (INDEPENDENT_AMBULATORY_CARE_PROVIDER_SITE_OTHER): Payer: Self-pay | Admitting: Surgery

## 2013-09-08 ENCOUNTER — Ambulatory Visit (INDEPENDENT_AMBULATORY_CARE_PROVIDER_SITE_OTHER): Payer: Medicare HMO | Admitting: Surgery

## 2013-09-08 VITALS — BP 130/72 | HR 86 | Temp 98.8°F | Ht 68.0 in | Wt 199.0 lb

## 2013-09-08 DIAGNOSIS — Z8719 Personal history of other diseases of the digestive system: Secondary | ICD-10-CM

## 2013-09-08 DIAGNOSIS — Z9889 Other specified postprocedural states: Secondary | ICD-10-CM

## 2013-09-08 NOTE — Progress Notes (Signed)
Richard Arnold 64 y.o.  Body mass index is 30.26 kg/(m^2).  Patient Active Problem List   Diagnosis Date Noted  . S/P right inguinal hernia repair July 2015 08/30/2013  . Parsonage-Turner syndrome-left upper extremity 08/22/2013  . Acute blood loss anemia 09/05/2011  . Osteoarthrosis, unspecified whether generalized or localized, shoulder region 01/30/2011    Allergies  Allergen Reactions  . Monosodium Glutamate Swelling    Rash, throat swells  . Tylox [Oxycodone-Acetaminophen]     Pt states he had a "weird feeling"  . Latex Rash      Past Surgical History  Procedure Laterality Date  . Shoulder arthroscopy  11    x4  one open rt  . Total shoulder arthroplasty  01/30/2011    Procedure: TOTAL SHOULDER ARTHROPLASTY;  Surgeon: Augustin Schooling;  Location: [place];  Service: Orthopedics;  Laterality: Right;  right total shoulder atrhoplasty  . Esophagogastroduodenoscopy  09/08/2011    Procedure: ESOPHAGOGASTRODUODENOSCOPY (EGD);  Surgeon: Winfield Cunas., MD;  Location: [facility];  Service: Endoscopy;  Laterality: N/A;  . Inguinal hernia repair Right 08/30/2013    Procedure: HERNIA REPAIR INGUINAL WITH MESH  ADULT;  Surgeon: Pedro Earls, MD;  Location: [facility];  Service: General;  Laterality: Right;   Kandice Hams, MD No diagnosis found.  Having a fair amount of discomfort.  No hematoma.  He has a piece of mesh adapted for his large internal ring and a piece buttressing his inguinal floor.   He is going to the beach next week.  Will see back in 6 weeks.  Matt B. Hassell Done, MD, [facility] [phone] beeper [phone]  09/08/2013 3:00 PM  .

## 2013-09-08 NOTE — Patient Instructions (Signed)
May increase activity gradually

## 2013-09-19 ENCOUNTER — Encounter (INDEPENDENT_AMBULATORY_CARE_PROVIDER_SITE_OTHER): Payer: Medicare HMO | Admitting: Surgery

## 2013-10-20 ENCOUNTER — Encounter (INDEPENDENT_AMBULATORY_CARE_PROVIDER_SITE_OTHER): Payer: Medicare HMO | Admitting: Surgery

## 2015-01-07 DIAGNOSIS — Z0000 Encounter for general adult medical examination without abnormal findings: Secondary | ICD-10-CM | POA: Diagnosis not present

## 2015-01-07 DIAGNOSIS — Z86010 Personal history of colonic polyps: Secondary | ICD-10-CM | POA: Diagnosis not present

## 2015-01-07 DIAGNOSIS — N529 Male erectile dysfunction, unspecified: Secondary | ICD-10-CM | POA: Diagnosis not present

## 2015-01-07 DIAGNOSIS — G4700 Insomnia, unspecified: Secondary | ICD-10-CM | POA: Diagnosis not present

## 2015-01-07 DIAGNOSIS — Z125 Encounter for screening for malignant neoplasm of prostate: Secondary | ICD-10-CM | POA: Diagnosis not present

## 2015-01-07 DIAGNOSIS — Z1389 Encounter for screening for other disorder: Secondary | ICD-10-CM | POA: Diagnosis not present

## 2015-01-07 DIAGNOSIS — E782 Mixed hyperlipidemia: Secondary | ICD-10-CM | POA: Diagnosis not present

## 2015-01-23 DIAGNOSIS — J329 Chronic sinusitis, unspecified: Secondary | ICD-10-CM | POA: Diagnosis not present

## 2015-02-27 ENCOUNTER — Other Ambulatory Visit: Payer: Self-pay | Admitting: Gastroenterology

## 2015-02-28 ENCOUNTER — Encounter (HOSPITAL_COMMUNITY): Payer: Self-pay | Admitting: *Deleted

## 2015-03-05 ENCOUNTER — Ambulatory Visit (HOSPITAL_COMMUNITY): Payer: Medicare HMO | Admitting: Certified Registered Nurse Anesthetist

## 2015-03-05 ENCOUNTER — Encounter (HOSPITAL_COMMUNITY)
Admission: RE | Disposition: A | Discharge status: Home / Self Care | Payer: Self-pay | Source: Ambulatory Visit | Attending: Gastroenterology

## 2015-03-05 ENCOUNTER — Encounter (HOSPITAL_COMMUNITY): Payer: Self-pay | Admitting: *Deleted

## 2015-03-05 ENCOUNTER — Ambulatory Visit (HOSPITAL_COMMUNITY)
Admission: RE | Admit: 2015-03-05 | Discharge: 2015-03-05 | Disposition: A | Discharge status: Home / Self Care | Payer: Medicare HMO | Source: Ambulatory Visit | Attending: Gastroenterology | Admitting: Gastroenterology

## 2015-03-05 DIAGNOSIS — Z1211 Encounter for screening for malignant neoplasm of colon: Secondary | ICD-10-CM | POA: Diagnosis not present

## 2015-03-05 DIAGNOSIS — E669 Obesity, unspecified: Secondary | ICD-10-CM | POA: Diagnosis not present

## 2015-03-05 DIAGNOSIS — D649 Anemia, unspecified: Secondary | ICD-10-CM | POA: Diagnosis not present

## 2015-03-05 DIAGNOSIS — M1990 Unspecified osteoarthritis, unspecified site: Secondary | ICD-10-CM | POA: Insufficient documentation

## 2015-03-05 DIAGNOSIS — E7800 Pure hypercholesterolemia, unspecified: Secondary | ICD-10-CM | POA: Diagnosis not present

## 2015-03-05 DIAGNOSIS — Z96611 Presence of right artificial shoulder joint: Secondary | ICD-10-CM | POA: Diagnosis not present

## 2015-03-05 DIAGNOSIS — D122 Benign neoplasm of ascending colon: Secondary | ICD-10-CM | POA: Diagnosis not present

## 2015-03-05 DIAGNOSIS — K635 Polyp of colon: Secondary | ICD-10-CM | POA: Diagnosis not present

## 2015-03-05 DIAGNOSIS — D120 Benign neoplasm of cecum: Secondary | ICD-10-CM | POA: Diagnosis not present

## 2015-03-05 DIAGNOSIS — Z86010 Personal history of colonic polyps: Secondary | ICD-10-CM | POA: Insufficient documentation

## 2015-03-05 DIAGNOSIS — Z6830 Body mass index (BMI) 30.0-30.9, adult: Secondary | ICD-10-CM | POA: Insufficient documentation

## 2015-03-05 HISTORY — PX: COLONOSCOPY WITH PROPOFOL: SHX5780

## 2015-03-05 SURGERY — COLONOSCOPY WITH PROPOFOL
Anesthesia: Monitor Anesthesia Care

## 2015-03-05 MED ORDER — LACTATED RINGERS IV SOLN
INTRAVENOUS | Status: DC
  Administered 2015-03-05: 1000 mL via INTRAVENOUS

## 2015-03-05 MED ORDER — PROPOFOL 500 MG/50ML IV EMUL
INTRAVENOUS | Status: DC | PRN
  Administered 2015-03-05: 100 ug/kg/min via INTRAVENOUS

## 2015-03-05 MED ORDER — PROPOFOL 500 MG/50ML IV EMUL
INTRAVENOUS | Status: DC | PRN
  Administered 2015-03-05: 30 mg via INTRAVENOUS
  Administered 2015-03-05: 40 mg via INTRAVENOUS

## 2015-03-05 MED ORDER — LIDOCAINE HCL (CARDIAC) 20 MG/ML IV SOLN
INTRAVENOUS | Status: DC | PRN
  Administered 2015-03-05: 50 mg via INTRAVENOUS

## 2015-03-05 SURGICAL SUPPLY — 21 items

## 2015-03-05 NOTE — H&P (Signed)
  Procedure: Surveillance colonoscopy. 08/30/2008 colonoscopy was performed with removal of a 5 mm adenomatous rectal polyp  History: The patient is a 65 year old male born [birth year]. He is scheduled to undergo a surveillance colonoscopy today  Past medical history: Right shoulder replacement surgery. Rotator cuff surgeries. Left brachial plexopathy. Hypercholesterolemia. Seasonal allergies. Treated H. pylori gastritis.  Medication allergies: Tylox caused nervousness. Kaopectate caused hives  Family history: Father diagnosed with colon cancer  Exam: The patient is alert and lying comfortably on the endoscopy stretcher. Abdomen is soft and nontender to palpation. Lungs are clear to auscultation. Cardiac exam reveals a regular rhythm.  Plan: Proceed with surveillance colonoscopy

## 2015-03-05 NOTE — Anesthesia Preprocedure Evaluation (Signed)
Anesthesia Evaluation  Patient identified by MRN, date of birth, ID band Patient awake    Reviewed: Allergy & Precautions, NPO status , Patient's Chart, lab work & pertinent test results  Airway Mallampati: II  TM Distance: >3 FB Neck ROM: Full    Dental no notable dental hx.    Pulmonary neg pulmonary ROS,    Pulmonary exam normal breath sounds clear to auscultation       Cardiovascular negative cardio ROS Normal cardiovascular exam Rhythm:Regular Rate:Normal     Neuro/Psych  Neuromuscular disease negative psych ROS   GI/Hepatic negative GI ROS, Neg liver ROS,   Endo/Other  negative endocrine ROS  Renal/GU negative Renal ROS  negative genitourinary   Musculoskeletal  (+) Arthritis ,   Abdominal (+) + obese,   Peds negative pediatric ROS (+)  Hematology  (+) anemia ,   Anesthesia Other Findings   Reproductive/Obstetrics negative OB ROS                             Anesthesia Physical Anesthesia Plan  ASA: II  Anesthesia Plan: MAC   Post-op Pain Management:    Induction: Intravenous  Airway Management Planned: Natural Airway  Additional Equipment:   Intra-op Plan:   Post-operative Plan:   Informed Consent: I have reviewed the patients History and Physical, chart, labs and discussed the procedure including the risks, benefits and alternatives for the proposed anesthesia with the patient or authorized representative who has indicated his/her understanding and acceptance.   Dental advisory given  Plan Discussed with: CRNA  Anesthesia Plan Comments:         Anesthesia Quick Evaluation

## 2015-03-05 NOTE — Discharge Instructions (Signed)
Colonoscopy, Care After °Refer to this sheet in the next few weeks. These instructions provide you with information on caring for yourself after your procedure. Your health care provider may also give you more specific instructions. Your treatment has been planned according to current medical practices, but problems sometimes occur. Call your health care provider if you have any problems or questions after your procedure. °WHAT TO EXPECT AFTER THE PROCEDURE  °After your procedure, it is typical to have the following: °· A small amount of blood in your stool. °· Moderate amounts of gas and mild abdominal cramping or bloating. °HOME CARE INSTRUCTIONS °· Do not drive, operate machinery, or sign important documents for 24 hours. °· You may shower and resume your regular physical activities, but move at a slower pace for the first 24 hours. °· Take frequent rest periods for the first 24 hours. °· Walk around or put a warm pack on your abdomen to help reduce abdominal cramping and bloating. °· Drink enough fluids to keep your urine clear or pale yellow. °· You may resume your normal diet as instructed by your health care provider. Avoid heavy or fried foods that are hard to digest. °· Avoid drinking alcohol for 24 hours or as instructed by your health care provider. °· Only take over-the-counter or prescription medicines as directed by your health care provider. °· If a tissue sample (biopsy) was taken during your procedure: °¨ Do not take aspirin or blood thinners for 7 days, or as instructed by your health care provider. °¨ Do not drink alcohol for 7 days, or as instructed by your health care provider. °¨ Eat soft foods for the first 24 hours. °SEEK MEDICAL CARE IF: °You have persistent spotting of blood in your stool 2-3 days after the procedure. °SEEK IMMEDIATE MEDICAL CARE IF: °· You have more than a small spotting of blood in your stool. °· You pass large blood clots in your stool. °· Your abdomen is swollen  (distended). °· You have nausea or vomiting. °· You have a fever. °· You have increasing abdominal pain that is not relieved with medicine. °  °This information is not intended to replace advice given to you by your health care provider. Make sure you discuss any questions you have with your health care provider. °  °Document Released: 09/10/2003 Document Revised: 11/16/2012 Document Reviewed: 10/03/2012 °Elsevier Interactive Patient Education ©2016 [employer] ° °

## 2015-03-05 NOTE — Anesthesia Postprocedure Evaluation (Signed)
Anesthesia Post Note  Patient: Richard Arnold  Procedure(s) Performed: Procedure(s) (LRB): COLONOSCOPY WITH PROPOFOL (N/A)  Patient location during evaluation: PACU Anesthesia Type: MAC Level of consciousness: awake and alert Pain management: pain level controlled Vital Signs Assessment: post-procedure vital signs reviewed and stable Respiratory status: spontaneous breathing, nonlabored ventilation, respiratory function stable and patient connected to nasal cannula oxygen Cardiovascular status: stable and blood pressure returned to baseline Anesthetic complications: no    Last Vitals:  Filed Vitals:   03/05/15 1331 03/05/15 1340  BP:  155/88  Pulse:  73  Temp: 36.6 C   Resp:  20    Last Pain: There were no vitals filed for this visit.               Genette Huertas J

## 2015-03-05 NOTE — Transfer of Care (Signed)
Immediate Anesthesia Transfer of Care Note  Patient: Richard Arnold  Procedure(s) Performed: Procedure(s): COLONOSCOPY WITH PROPOFOL (N/A)  Patient Location: PACU  Anesthesia Type:MAC  Level of Consciousness: awake, alert  and oriented  Airway & Oxygen Therapy: Patient Spontanous Breathing and Patient connected to face mask oxygen  Post-op Assessment: Report given to RN and Post -op Vital signs reviewed and stable  Post vital signs: Reviewed and stable  Last Vitals:  Filed Vitals:   03/05/15 1137  BP: 139/70  Temp: 36.7 C  Resp: 15    Complications: No apparent anesthesia complications

## 2015-03-05 NOTE — Op Note (Signed)
Procedure: Surveillance colonoscopy. 08/30/2008 colonoscopy was performed with removal of a 5 mm adenomatous rectal polyp  Endoscopist: Earle Gell  Premedication: Propofol administered by anesthesia  Procedure: The patient was placed in the left lateral decubitus position. Anal inspection and digital rectal exam were normal. The Pentax pediatric colonoscope was introduced into the rectum and advanced to the cecum. A normal-appearing appendiceal orifice and ileocecal valve were identified. Colonic preparation for the exam today was good. Withdrawal time was 11 minutes  Rectum. Normal. Retroflexed view of the distal rectum was normal  Sigmoid colon and descending colon. Normal  Splenic flexure. Normal  Transverse colon. Normal  Hepatic flexure. Normal  Ascending colon. Normal  Cecum and ileocecal valve. A 5 mm sessile polyp was removed with the cold snare and a 3 mm sessile polyp was removed with the cold biopsy forceps  Assessment: Two small polyps were removed from the cecum. Otherwise normal surveillance colonoscopy  Recommendation: Schedule repeat surveillance colonoscopy in 5 years

## 2015-03-06 ENCOUNTER — Encounter (HOSPITAL_COMMUNITY): Payer: Self-pay | Admitting: Gastroenterology

## 2015-04-04 DIAGNOSIS — J0100 Acute maxillary sinusitis, unspecified: Secondary | ICD-10-CM | POA: Diagnosis not present

## 2015-04-22 DIAGNOSIS — L814 Other melanin hyperpigmentation: Secondary | ICD-10-CM | POA: Diagnosis not present

## 2015-04-22 DIAGNOSIS — D225 Melanocytic nevi of trunk: Secondary | ICD-10-CM | POA: Diagnosis not present

## 2015-04-22 DIAGNOSIS — D1801 Hemangioma of skin and subcutaneous tissue: Secondary | ICD-10-CM | POA: Diagnosis not present

## 2015-04-22 DIAGNOSIS — L821 Other seborrheic keratosis: Secondary | ICD-10-CM | POA: Diagnosis not present

## 2015-04-22 DIAGNOSIS — L570 Actinic keratosis: Secondary | ICD-10-CM | POA: Diagnosis not present

## 2015-07-16 DIAGNOSIS — J329 Chronic sinusitis, unspecified: Secondary | ICD-10-CM | POA: Diagnosis not present

## 2015-07-16 DIAGNOSIS — R030 Elevated blood-pressure reading, without diagnosis of hypertension: Secondary | ICD-10-CM | POA: Diagnosis not present

## 2015-07-19 DIAGNOSIS — J189 Pneumonia, unspecified organism: Secondary | ICD-10-CM | POA: Diagnosis not present

## 2015-07-27 DIAGNOSIS — J189 Pneumonia, unspecified organism: Secondary | ICD-10-CM | POA: Diagnosis not present

## 2015-12-19 DIAGNOSIS — D1801 Hemangioma of skin and subcutaneous tissue: Secondary | ICD-10-CM | POA: Diagnosis not present

## 2015-12-19 DIAGNOSIS — C44212 Basal cell carcinoma of skin of right ear and external auricular canal: Secondary | ICD-10-CM | POA: Diagnosis not present

## 2015-12-19 DIAGNOSIS — L821 Other seborrheic keratosis: Secondary | ICD-10-CM | POA: Diagnosis not present

## 2015-12-19 DIAGNOSIS — D2272 Melanocytic nevi of left lower limb, including hip: Secondary | ICD-10-CM | POA: Diagnosis not present

## 2015-12-19 DIAGNOSIS — L570 Actinic keratosis: Secondary | ICD-10-CM | POA: Diagnosis not present

## 2015-12-19 DIAGNOSIS — D225 Melanocytic nevi of trunk: Secondary | ICD-10-CM | POA: Diagnosis not present

## 2015-12-19 DIAGNOSIS — D2271 Melanocytic nevi of right lower limb, including hip: Secondary | ICD-10-CM | POA: Diagnosis not present

## 2015-12-19 DIAGNOSIS — C4441 Basal cell carcinoma of skin of scalp and neck: Secondary | ICD-10-CM | POA: Diagnosis not present

## 2015-12-19 DIAGNOSIS — L820 Inflamed seborrheic keratosis: Secondary | ICD-10-CM | POA: Diagnosis not present

## 2015-12-23 DIAGNOSIS — J40 Bronchitis, not specified as acute or chronic: Secondary | ICD-10-CM | POA: Diagnosis not present

## 2016-01-07 ENCOUNTER — Emergency Department (HOSPITAL_COMMUNITY): Payer: Medicare HMO

## 2016-01-07 ENCOUNTER — Emergency Department (HOSPITAL_COMMUNITY)
Admission: EM | Admit: 2016-01-07 | Discharge: 2016-01-07 | Disposition: A | Discharge status: Home / Self Care | Payer: Medicare HMO | Attending: Emergency Medicine | Admitting: Emergency Medicine

## 2016-01-07 ENCOUNTER — Encounter (HOSPITAL_COMMUNITY): Payer: Self-pay | Admitting: *Deleted

## 2016-01-07 DIAGNOSIS — Z7982 Long term (current) use of aspirin: Secondary | ICD-10-CM | POA: Insufficient documentation

## 2016-01-07 DIAGNOSIS — Z91040 Latex allergy status: Secondary | ICD-10-CM | POA: Diagnosis not present

## 2016-01-07 DIAGNOSIS — J069 Acute upper respiratory infection, unspecified: Secondary | ICD-10-CM

## 2016-01-07 DIAGNOSIS — R5383 Other fatigue: Secondary | ICD-10-CM | POA: Diagnosis not present

## 2016-01-07 DIAGNOSIS — R05 Cough: Secondary | ICD-10-CM | POA: Diagnosis not present

## 2016-01-07 DIAGNOSIS — Z96611 Presence of right artificial shoulder joint: Secondary | ICD-10-CM | POA: Insufficient documentation

## 2016-01-07 LAB — COMPREHENSIVE METABOLIC PANEL
ALBUMIN: 4.1 g/dL (ref 3.5–5.0)
ALK PHOS: 56 U/L (ref 38–126)
ALT: 26 U/L (ref 17–63)
ANION GAP: 8 (ref 5–15)
AST: 29 U/L (ref 15–41)
BILIRUBIN TOTAL: 1 mg/dL (ref 0.3–1.2)
BUN: 19 mg/dL (ref 6–20)
CALCIUM: 9 mg/dL (ref 8.9–10.3)
CO2: 25 mmol/L (ref 22–32)
CREATININE: 1.09 mg/dL (ref 0.61–1.24)
Chloride: 105 mmol/L (ref 101–111)
GFR calc Af Amer: >60 mL/min (ref >60)
GFR calc non Af Amer: >60 mL/min (ref >60)
GLUCOSE: 130 mg/dL — AB (ref 65–99)
Potassium: 3.6 mmol/L (ref 3.5–5.1)
Sodium: 138 mmol/L (ref 135–145)
TOTAL PROTEIN: 7.3 g/dL (ref 6.5–8.1)

## 2016-01-07 LAB — CBC WITH DIFFERENTIAL/PLATELET
BASOS PCT: 1 %
Basophils Absolute: 0 10*3/uL (ref 0.0–0.1)
Eosinophils Absolute: 0.2 10*3/uL (ref 0.0–0.7)
Eosinophils Relative: 3 %
HEMATOCRIT: 41.5 % (ref 39.0–52.0)
HEMOGLOBIN: 14.3 g/dL (ref 13.0–17.0)
Lymphocytes Relative: 24 %
Lymphs Abs: 1.7 10*3/uL (ref 0.7–4.0)
MCH: 30.3 pg (ref 26.0–34.0)
MCHC: 34.5 g/dL (ref 30.0–36.0)
MCV: 87.9 fL (ref 78.0–100.0)
MONOS PCT: 9 %
Monocytes Absolute: 0.6 10*3/uL (ref 0.1–1.0)
NEUTROS ABS: 4.6 10*3/uL (ref 1.7–7.7)
NEUTROS PCT: 63 %
Platelets: 202 10*3/uL (ref 150–400)
RBC: 4.72 MIL/uL (ref 4.22–5.81)
RDW: 13.7 % (ref 11.5–15.5)
WBC: 7.1 10*3/uL (ref 4.0–10.5)

## 2016-01-07 NOTE — ED Triage Notes (Addendum)
Pt sent from urgent due to chest x-ray showing fluid in left lung and diminished lung sounds. Pt states he has had progressive fatigue over the past few days. Pt has disc with x-ray performed at urgent care.  Pt finished treatment for bronchitis with Levaquin, prednisone on 11/23.Marland Kitchen

## 2016-01-07 NOTE — ED Provider Notes (Signed)
[facility] DEPT Provider Note   CSN: [id number] Arrival date & time: 01/07/16  1230     History   Chief Complaint Chief Complaint  Patient presents with  . Fatigue  . Sent from Urgent Care    HPI Richard Arnold is a 65 y.o. male.  Patient has had a cough and weakness for a couple weeks. He was put on Levaquin on inhaler prednisone and cough medicine. He was seen at urgent care for follow-up for weakness. Chest x-ray showed questionable fluid in left lungs.    Weakness  Primary symptoms include no loss of sensation. This is a new problem. The current episode started more than 2 days ago. The problem has been gradually improving. There was no focality noted. There has been no fever. Pertinent negatives include no chest pain and no headaches. Meds prior to arrival: None. Associated medical issues do not include trauma.    Past Medical History:  Diagnosis Date  . Arthritis   . Brachial neuritis   . Hypercholesteremia   . 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 S/P right inguinal hernia repair July 2015 08/30/2013  . [place]-Turner syndrome-left upper extremity 08/22/2013  . Acute blood loss anemia 09/05/2011  . Osteoarthrosis, unspecified whether generalized or localized, shoulder region 01/30/2011    Past Surgical History:  Procedure Laterality Date  . COLONOSCOPY WITH PROPOFOL N/A 03/05/2015   Procedure: COLONOSCOPY WITH PROPOFOL;  Surgeon: Garlan Fair, MD;  Location: [facility];  Service: Endoscopy;  Laterality: N/A;  . ESOPHAGOGASTRODUODENOSCOPY  09/08/2011   Procedure: ESOPHAGOGASTRODUODENOSCOPY (EGD);  Surgeon: Winfield Cunas., MD;  Location: [facility];  Service: Endoscopy;  Laterality: N/A;  . INGUINAL HERNIA REPAIR Right 08/30/2013   Procedure: HERNIA REPAIR INGUINAL WITH MESH  ADULT;  Surgeon: Pedro Earls, MD;  Location: [facility];  Service: General;  Laterality: Right;    . SHOULDER ARTHROSCOPY  11   x4  one open rt  . TOTAL SHOULDER ARTHROPLASTY  01/30/2011   Procedure: TOTAL SHOULDER ARTHROPLASTY;  Surgeon: Augustin Schooling;  Location: [facility];  Service: Orthopedics;  Laterality: Right;  right total shoulder atrhoplasty       Home Medications    Prior to Admission medications   Medication Sig Start Date End Date Taking? Authorizing Provider  aspirin EC 81 MG tablet Take 81 mg by mouth at bedtime.    Yes Historical Provider, MD  atorvastatin (LIPITOR) 10 MG tablet Take 10 mg by mouth every morning.    Yes Historical Provider, MD  diphenhydrAMINE (SOMINEX) 25 MG tablet Take 25 mg by mouth 2 (two) times daily.   Yes Historical Provider, MD  fish oil-omega-3 fatty acids 1000 MG capsule Take 2 g by mouth daily.    Yes Historical Provider, MD  PROAIR HFA 108 (90 Base) MCG/ACT inhaler Inhale 1 puff into the lungs every 6 (six) hours as needed for shortness of breath.  12/23/15  Yes Historical Provider, MD  temazepam (RESTORIL) 30 MG capsule Take 30 mg by mouth at bedtime.   Yes Historical Provider, MD    Family History Family History  Problem Relation Age of Onset  . Heart disease Mother   . Heart disease Father     Social History Social History  Substance Use Topics  . Smoking status: Never Smoker  . Smokeless tobacco: Never Used  . Alcohol use No     Allergies  Monosodium glutamate; Tylox [oxycodone-acetaminophen]; and Latex   Review of Systems Review of Systems  Constitutional: Negative for appetite change and fatigue.  HENT: Negative for congestion, ear discharge and sinus pressure.   Eyes: Negative for discharge.  Respiratory: Negative for cough.   Cardiovascular: Negative for chest pain.  Gastrointestinal: Negative for abdominal pain and diarrhea.  Genitourinary: Negative for frequency and hematuria.  Musculoskeletal: Negative for back pain.  Skin: Negative for rash.  Neurological: Positive for weakness. Negative for seizures and  headaches.  Psychiatric/Behavioral: Negative for hallucinations.     Physical Exam Updated Vital Signs BP 143/82   Pulse 77   Temp 97.9 F (36.6 C)   Resp 18   Wt 201 lb (91.2 kg)   SpO2 96%   BMI 31.48 kg/m   Physical Exam  Constitutional: He is oriented to person, place, and time. He appears well-developed.  HENT:  Head: Normocephalic.  Eyes: Conjunctivae and EOM are normal. No scleral icterus.  Neck: Neck supple. No thyromegaly present.  Cardiovascular: Normal rate and regular rhythm.  Exam reveals no gallop and no friction rub.   No murmur heard. Pulmonary/Chest: No stridor. He has no wheezes. He has no rales. He exhibits no tenderness.  Abdominal: He exhibits no distension. There is no tenderness. There is no rebound.  Musculoskeletal: Normal range of motion. He exhibits no edema.  Lymphadenopathy:    He has no cervical adenopathy.  Neurological: He is oriented to person, place, and time. He exhibits normal muscle tone. Coordination normal.  Skin: No rash noted. No erythema.  Psychiatric: He has a normal mood and affect. His behavior is normal.     ED Treatments / Results  Labs (all labs ordered are listed, but only abnormal results are displayed) Labs Reviewed  COMPREHENSIVE METABOLIC PANEL - Abnormal; Notable for the following:       Result Value   Glucose, Bld 130 (*)    All other components within normal limits  CBC WITH DIFFERENTIAL/PLATELET    EKG  EKG Interpretation None       Radiology Dg Chest 2 View  Result Date: 01/07/2016 CLINICAL DATA:  Cough. EXAM: CHEST  2 VIEW COMPARISON:  None. FINDINGS: The heart size and mediastinal contours are within normal limits. Both lungs are clear. No pneumothorax or pleural effusion is noted. Elevated left hemidiaphragm is noted. Status post right shoulder arthroplasty. IMPRESSION: Elevated left hemidiaphragm. No acute cardiopulmonary abnormality seen. Electronically Signed   By: Marijo Conception, M.D.   On:  01/07/2016 14:45    Procedures Procedures (including critical care time)  Medications Ordered in ED Medications - No data to display   Initial Impression / Assessment and Plan / ED Course  I have reviewed the triage vital signs and the nursing notes.  Pertinent labs & imaging results that were available during my care of the patient were reviewed by me and considered in my medical decision making (see chart for details).  Clinical Course     Labs unremarkable. Chest x-ray shows elevated diaphragm on the left side. Patient states this is chronic. Suspect URI exacerbation that's improving. I told patient to continue the inhaler and follow-up with his regular doctor next week for recheck  Final Clinical Impressions(s) / ED Diagnoses   Final diagnoses:  Acute URI    New Prescriptions New Prescriptions   No medications on file     Milton Ferguson, MD 01/07/16 1558

## 2016-01-07 NOTE — Discharge Instructions (Signed)
Continue using your inhaler if needed. Follow-up with their family doctor next week for recheck. Return if any problems

## 2016-01-13 DIAGNOSIS — C44212 Basal cell carcinoma of skin of right ear and external auricular canal: Secondary | ICD-10-CM | POA: Diagnosis not present

## 2016-01-13 DIAGNOSIS — Z85828 Personal history of other malignant neoplasm of skin: Secondary | ICD-10-CM | POA: Diagnosis not present

## 2016-02-11 DIAGNOSIS — Z0001 Encounter for general adult medical examination with abnormal findings: Secondary | ICD-10-CM | POA: Diagnosis not present

## 2016-02-11 DIAGNOSIS — G4700 Insomnia, unspecified: Secondary | ICD-10-CM | POA: Diagnosis not present

## 2016-02-11 DIAGNOSIS — E7800 Pure hypercholesterolemia, unspecified: Secondary | ICD-10-CM | POA: Diagnosis not present

## 2016-02-11 DIAGNOSIS — R251 Tremor, unspecified: Secondary | ICD-10-CM | POA: Diagnosis not present

## 2016-02-11 DIAGNOSIS — Z1389 Encounter for screening for other disorder: Secondary | ICD-10-CM | POA: Diagnosis not present

## 2016-02-11 DIAGNOSIS — Z125 Encounter for screening for malignant neoplasm of prostate: Secondary | ICD-10-CM | POA: Diagnosis not present

## 2016-02-17 DIAGNOSIS — H52223 Regular astigmatism, bilateral: Secondary | ICD-10-CM | POA: Diagnosis not present

## 2016-02-17 DIAGNOSIS — Z0100 Encounter for examination of eyes and vision without abnormal findings: Secondary | ICD-10-CM | POA: Diagnosis not present

## 2016-02-17 DIAGNOSIS — H2513 Age-related nuclear cataract, bilateral: Secondary | ICD-10-CM | POA: Diagnosis not present

## 2016-02-17 DIAGNOSIS — H5203 Hypermetropia, bilateral: Secondary | ICD-10-CM | POA: Diagnosis not present

## 2016-07-20 DIAGNOSIS — D2262 Melanocytic nevi of left upper limb, including shoulder: Secondary | ICD-10-CM | POA: Diagnosis not present

## 2016-07-20 DIAGNOSIS — D225 Melanocytic nevi of trunk: Secondary | ICD-10-CM | POA: Diagnosis not present

## 2016-07-20 DIAGNOSIS — L570 Actinic keratosis: Secondary | ICD-10-CM | POA: Diagnosis not present

## 2016-07-20 DIAGNOSIS — L814 Other melanin hyperpigmentation: Secondary | ICD-10-CM | POA: Diagnosis not present

## 2016-07-20 DIAGNOSIS — L821 Other seborrheic keratosis: Secondary | ICD-10-CM | POA: Diagnosis not present

## 2016-07-20 DIAGNOSIS — Z85828 Personal history of other malignant neoplasm of skin: Secondary | ICD-10-CM | POA: Diagnosis not present

## 2016-07-20 DIAGNOSIS — D1801 Hemangioma of skin and subcutaneous tissue: Secondary | ICD-10-CM | POA: Diagnosis not present

## 2016-07-20 DIAGNOSIS — D2261 Melanocytic nevi of right upper limb, including shoulder: Secondary | ICD-10-CM | POA: Diagnosis not present

## 2016-07-21 DIAGNOSIS — M545 Low back pain: Secondary | ICD-10-CM | POA: Diagnosis not present

## 2016-08-04 DIAGNOSIS — R69 Illness, unspecified: Secondary | ICD-10-CM | POA: Diagnosis not present

## 2016-08-18 DIAGNOSIS — R69 Illness, unspecified: Secondary | ICD-10-CM | POA: Diagnosis not present

## 2016-09-01 DIAGNOSIS — R69 Illness, unspecified: Secondary | ICD-10-CM | POA: Diagnosis not present

## 2016-09-29 DIAGNOSIS — R69 Illness, unspecified: Secondary | ICD-10-CM | POA: Diagnosis not present

## 2016-10-05 DIAGNOSIS — R69 Illness, unspecified: Secondary | ICD-10-CM | POA: Diagnosis not present

## 2017-01-19 DIAGNOSIS — Z85828 Personal history of other malignant neoplasm of skin: Secondary | ICD-10-CM | POA: Diagnosis not present

## 2017-01-19 DIAGNOSIS — L570 Actinic keratosis: Secondary | ICD-10-CM | POA: Diagnosis not present

## 2017-01-19 DIAGNOSIS — L814 Other melanin hyperpigmentation: Secondary | ICD-10-CM | POA: Diagnosis not present

## 2017-01-19 DIAGNOSIS — D1801 Hemangioma of skin and subcutaneous tissue: Secondary | ICD-10-CM | POA: Diagnosis not present

## 2017-01-19 DIAGNOSIS — L821 Other seborrheic keratosis: Secondary | ICD-10-CM | POA: Diagnosis not present

## 2017-03-09 DIAGNOSIS — R69 Illness, unspecified: Secondary | ICD-10-CM | POA: Diagnosis not present

## 2017-03-15 DIAGNOSIS — R69 Illness, unspecified: Secondary | ICD-10-CM | POA: Diagnosis not present

## 2017-03-23 DIAGNOSIS — R69 Illness, unspecified: Secondary | ICD-10-CM | POA: Diagnosis not present

## 2017-04-16 ENCOUNTER — Encounter: Payer: Self-pay | Admitting: *Deleted

## 2017-04-19 ENCOUNTER — Encounter: Payer: Self-pay | Admitting: Family Medicine

## 2017-04-19 ENCOUNTER — Ambulatory Visit (INDEPENDENT_AMBULATORY_CARE_PROVIDER_SITE_OTHER): Payer: Medicare HMO | Admitting: Family Medicine

## 2017-04-19 VITALS — BP 138/80 | HR 74 | Temp 98.2°F | Resp 16 | Ht 67.0 in | Wt 202.0 lb

## 2017-04-19 DIAGNOSIS — Z23 Encounter for immunization: Secondary | ICD-10-CM | POA: Diagnosis not present

## 2017-04-19 DIAGNOSIS — Z125 Encounter for screening for malignant neoplasm of prostate: Secondary | ICD-10-CM | POA: Diagnosis not present

## 2017-04-19 DIAGNOSIS — Z0000 Encounter for general adult medical examination without abnormal findings: Secondary | ICD-10-CM | POA: Diagnosis not present

## 2017-04-19 DIAGNOSIS — Z1159 Encounter for screening for other viral diseases: Secondary | ICD-10-CM

## 2017-04-19 DIAGNOSIS — E7800 Pure hypercholesterolemia, unspecified: Secondary | ICD-10-CM

## 2017-04-19 MED ORDER — ZOSTER VAC RECOMB ADJUVANTED 50 MCG/0.5ML IM SUSR: 0.5000 mL | Freq: Once | INTRAMUSCULAR | 1 refills | Status: DC

## 2017-04-19 NOTE — Progress Notes (Signed)
Subjective:    Patient ID: Richard Arnold, male    DOB: 09-27-1950, 67 y.o.   MRN: [id number]  HPI Patient is a very pleasant 67 year old Caucasian male here today to establish care and for physical exam. Past medical history is significant for hyperlipidemia. He also has a history of [place] Turner syndrome in his left arm which per the patient report is idiopathic nerve degeneration. Patient states that he has had MRIs and nerve conduction studies and has seen neurology for this. He continues to have some numbness in the distribution of the ulnar nerve nucleus fourth and fifth digits. However his muscle strength in his left upper extremity is impressive and he denies any neuropathic pain at the present time although in the past he has seen a pain clinic for this. He also has a history of recurrent shoulder surgery in his right shoulder. He is undergone several arthroscopies and ultimately underwent a shoulder replacement in the right shoulder.  Last colonoscopy was 2017. He is due for prostate cancer screening along with hepatitis C screening. Past Medical History:  Diagnosis Date  . Arthritis   . Brachial neuritis   . Hypercholesteremia   . Turner syndrome    left arm, some numbness in fingers   Past Surgical History:  Procedure Laterality Date  . COLONOSCOPY WITH PROPOFOL N/A 03/05/2015   Procedure: COLONOSCOPY WITH PROPOFOL;  Surgeon: Garlan Fair, MD;  Location: [facility];  Service: Endoscopy;  Laterality: N/A;  . ESOPHAGOGASTRODUODENOSCOPY  09/08/2011   Procedure: ESOPHAGOGASTRODUODENOSCOPY (EGD);  Surgeon: Winfield Cunas., MD;  Location: [facility];  Service: Endoscopy;  Laterality: N/A;  . HERNIA REPAIR    . INGUINAL HERNIA REPAIR Right 08/30/2013   Procedure: HERNIA REPAIR INGUINAL WITH MESH  ADULT;  Surgeon: Pedro Earls, MD;  Location: [facility];  Service: General;  Laterality: Right;  . SHOULDER ARTHROSCOPY  2011   x4  one open rt, rotator cuff tear, Dr. Amedeo Plenty  .  TOTAL SHOULDER ARTHROPLASTY  01/30/2011   Procedure: TOTAL SHOULDER ARTHROPLASTY;  Surgeon: Augustin Schooling;  Location: [facility];  Service: Orthopedics;  Laterality: Right;  right total shoulder atrhoplasty   Current Outpatient Medications on File Prior to Visit  Medication Sig Dispense Refill  . aspirin EC 81 MG tablet Take 81 mg by mouth at bedtime.     Marland Kitchen atorvastatin (LIPITOR) 10 MG tablet Take 10 mg by mouth every morning.     . fish oil-omega-3 fatty acids 1000 MG capsule Take 2 g by mouth daily.     . temazepam (RESTORIL) 30 MG capsule Take 30 mg by mouth at bedtime.     No current facility-administered medications on file prior to visit.    Allergies  Allergen Reactions  . Monosodium Glutamate Swelling    Rash, throat swells  . Tylox [Oxycodone-Acetaminophen]     Pt states he had a "weird feeling"  . Latex Rash   Social History   Socioeconomic History  . Marital status: Married    Spouse name: Not on file  . Number of children: Not on file  . Years of education: Not on file  . Highest education level: Not on file  Social Needs  . Financial resource strain: Not on file  . Food insecurity - worry: Not on file  . Food insecurity - inability: Not on file  . Transportation needs - medical: Not on file  . Transportation needs - non-medical: Not on file  Occupational History  . Not  on file  Tobacco Use  . Smoking status: Never Smoker  . Smokeless tobacco: Never Used  Substance and Sexual Activity  . Alcohol use: No  . Drug use: No  . Sexual activity: Not on file  Other Topics Concern  . Not on file  Social History Narrative  . Not on file   Family History  Problem Relation Age of Onset  . Heart disease Mother   . Heart disease Father    Family history is significant for a history of CABG in his father later in life   Review of Systems  All other systems reviewed and are negative.      Objective:   Physical Exam  Constitutional: He is oriented to person,  place, and time. He appears well-developed and well-nourished. No distress.  HENT:  Head: Normocephalic and atraumatic.  Right Ear: External ear normal.  Left Ear: External ear normal.  Nose: Nose normal.  Mouth/Throat: Oropharynx is clear and moist. No oropharyngeal exudate.  Eyes: Conjunctivae and EOM are normal. Pupils are equal, round, and reactive to light. Right eye exhibits no discharge. Left eye exhibits no discharge. No scleral icterus.  Neck: Normal range of motion. Neck supple. No JVD present. No tracheal deviation present. No thyromegaly present.  Cardiovascular: Normal rate, regular rhythm, normal heart sounds and intact distal pulses. Exam reveals no gallop and no friction rub.  No murmur heard. Pulmonary/Chest: Effort normal and breath sounds normal. No stridor. No respiratory distress. He has no wheezes. He has no rales. He exhibits no tenderness.  Abdominal: Soft. Bowel sounds are normal. He exhibits no distension and no mass. There is no tenderness. There is no rebound and no guarding.  Genitourinary: Rectum normal, prostate normal and penis normal.  Musculoskeletal: Normal range of motion. He exhibits no edema, tenderness or deformity.  Lymphadenopathy:    He has no cervical adenopathy.  Neurological: He is alert and oriented to person, place, and time. He has normal reflexes. He displays normal reflexes. No cranial nerve deficit. He exhibits normal muscle tone. Coordination normal.  Skin: Skin is warm. No rash noted. He is not diaphoretic. No erythema. No pallor.  Psychiatric: He has a normal mood and affect. His behavior is normal. Judgment and thought content normal.  Vitals reviewed.         Assessment & Plan:  Encounter for hepatitis C screening test for low risk patient - Plan: Hepatitis C Antibody  Pure hypercholesterolemia - Plan: CBC with Differential/Platelet, COMPLETE METABOLIC PANEL WITH GFR, Lipid panel  Prostate cancer screening - Plan: PSA  General  medical exam  Physical exam today is completely normal. Blood pressure is outstanding. Colonoscopy is up-to-date. Patient is due for Prevnar 13. Also recommended a shingles vaccine. We defer the tetanus shot at the present time the cost. Recommended hepatitis C screening. Will check fasting lab work to screen for hepatitis C. Will screen for prostate cancer with PSA although his prostate exam is normal. We'll also check a CBC, CMP, fasting lipid panel.

## 2017-04-19 NOTE — Addendum Note (Signed)
Addended by: Shary Decamp B on: 04/19/2017 12:45 PM   Modules accepted: Orders

## 2017-04-20 LAB — COMPLETE METABOLIC PANEL WITH GFR
AG RATIO: 1.6 (calc) (ref 1.0–2.5)
ALKALINE PHOSPHATASE (APISO): 59 U/L (ref 40–115)
ALT: 17 U/L (ref 9–46)
AST: 19 U/L (ref 10–35)
Albumin: 4.3 g/dL (ref 3.6–5.1)
BUN: 20 mg/dL (ref 7–25)
CHLORIDE: 103 mmol/L (ref 98–110)
CO2: 24 mmol/L (ref 20–32)
Calcium: 9.3 mg/dL (ref 8.6–10.3)
Creat: 1.04 mg/dL (ref 0.70–1.25)
GFR, EST AFRICAN AMERICAN: 85 mL/min/{1.73_m2} (ref >=60)
GFR, Est Non African American: 73 mL/min/{1.73_m2} (ref >=60)
GLUCOSE: 83 mg/dL (ref 65–99)
Globulin: 2.7 g/dL (calc) (ref 1.9–3.7)
POTASSIUM: 4.5 mmol/L (ref 3.5–5.3)
Sodium: 138 mmol/L (ref 135–146)
TOTAL PROTEIN: 7 g/dL (ref 6.1–8.1)
Total Bilirubin: 0.7 mg/dL (ref 0.2–1.2)

## 2017-04-20 LAB — CBC WITH DIFFERENTIAL/PLATELET
BASOS ABS: 58 {cells}/uL (ref 0–200)
Basophils Relative: 0.9 %
EOS ABS: 230 {cells}/uL (ref 15–500)
Eosinophils Relative: 3.6 %
HCT: 44.6 % (ref 38.5–50.0)
Hemoglobin: 15.5 g/dL (ref 13.2–17.1)
Lymphs Abs: 1760 cells/uL (ref 850–3900)
MCH: 30.5 pg (ref 27.0–33.0)
MCHC: 34.8 g/dL (ref 32.0–36.0)
MCV: 87.8 fL (ref 80.0–100.0)
MONOS PCT: 9.7 %
MPV: 11.1 fL (ref 7.5–12.5)
NEUTROS PCT: 58.3 %
Neutro Abs: 3731 cells/uL (ref 1500–7800)
PLATELETS: 228 10*3/uL (ref 140–400)
RBC: 5.08 10*6/uL (ref 4.20–5.80)
RDW: 12.9 % (ref 11.0–15.0)
TOTAL LYMPHOCYTE: 27.5 %
WBC: 6.4 10*3/uL (ref 3.8–10.8)
WBCMIX: 621 {cells}/uL (ref 200–950)

## 2017-04-20 LAB — LIPID PANEL
CHOLESTEROL: 199 mg/dL (ref <200)
HDL: 39 mg/dL — ABNORMAL LOW (ref >40)
LDL CHOLESTEROL (CALC): 130 mg/dL — AB
Non-HDL Cholesterol (Calc): 160 mg/dL (calc) — ABNORMAL HIGH (ref <130)
TRIGLYCERIDES: 162 mg/dL — AB (ref <150)
Total CHOL/HDL Ratio: 5.1 (calc) — ABNORMAL HIGH (ref <5.0)

## 2017-04-20 LAB — HEPATITIS C ANTIBODY
HEP C AB: NONREACTIVE
SIGNAL TO CUT-OFF: 0.03 (ref <1.00)

## 2017-04-20 LAB — PSA: PSA: 0.3 ng/mL (ref <=4.0)

## 2017-04-22 ENCOUNTER — Other Ambulatory Visit: Payer: Self-pay | Admitting: *Deleted

## 2017-04-22 MED ORDER — ATORVASTATIN CALCIUM 20 MG PO TABS: 20.0000 mg | ORAL_TABLET | Freq: Every day | ORAL | 3 refills | Status: DC

## 2017-04-22 NOTE — Telephone Encounter (Signed)
Received call from patient.   Reports that he requires refill on Restoril and would like refills to go to [facility] order.   Ok to refill to mail order?

## 2017-04-23 MED ORDER — TEMAZEPAM 30 MG PO CAPS: 30.0000 mg | ORAL_CAPSULE | Freq: Every day | ORAL | 1 refills | Status: DC

## 2017-05-06 DIAGNOSIS — H5203 Hypermetropia, bilateral: Secondary | ICD-10-CM | POA: Diagnosis not present

## 2017-05-06 DIAGNOSIS — H524 Presbyopia: Secondary | ICD-10-CM | POA: Diagnosis not present

## 2017-05-06 DIAGNOSIS — Z0100 Encounter for examination of eyes and vision without abnormal findings: Secondary | ICD-10-CM | POA: Diagnosis not present

## 2017-05-06 DIAGNOSIS — H52223 Regular astigmatism, bilateral: Secondary | ICD-10-CM | POA: Diagnosis not present

## 2017-07-20 DIAGNOSIS — L814 Other melanin hyperpigmentation: Secondary | ICD-10-CM | POA: Diagnosis not present

## 2017-07-20 DIAGNOSIS — L570 Actinic keratosis: Secondary | ICD-10-CM | POA: Diagnosis not present

## 2017-07-20 DIAGNOSIS — L821 Other seborrheic keratosis: Secondary | ICD-10-CM | POA: Diagnosis not present

## 2017-07-20 DIAGNOSIS — D1801 Hemangioma of skin and subcutaneous tissue: Secondary | ICD-10-CM | POA: Diagnosis not present

## 2017-07-20 DIAGNOSIS — D225 Melanocytic nevi of trunk: Secondary | ICD-10-CM | POA: Diagnosis not present

## 2017-07-20 DIAGNOSIS — Z85828 Personal history of other malignant neoplasm of skin: Secondary | ICD-10-CM | POA: Diagnosis not present

## 2017-09-01 ENCOUNTER — Encounter: Payer: Self-pay | Admitting: Family Medicine

## 2017-09-01 ENCOUNTER — Ambulatory Visit
Admission: RE | Admit: 2017-09-01 | Discharge: 2017-09-01 | Disposition: A | Discharge status: Home / Self Care | Payer: Medicare HMO | Source: Ambulatory Visit | Attending: Family Medicine | Admitting: Family Medicine

## 2017-09-01 ENCOUNTER — Ambulatory Visit (INDEPENDENT_AMBULATORY_CARE_PROVIDER_SITE_OTHER): Payer: Medicare HMO | Admitting: Family Medicine

## 2017-09-01 VITALS — BP 136/82 | HR 77 | Temp 98.3°F | Resp 16 | Ht 67.0 in | Wt 203.0 lb

## 2017-09-01 DIAGNOSIS — M79671 Pain in right foot: Secondary | ICD-10-CM | POA: Diagnosis not present

## 2017-09-01 DIAGNOSIS — M19071 Primary osteoarthritis, right ankle and foot: Secondary | ICD-10-CM | POA: Diagnosis not present

## 2017-09-01 MED ORDER — TRAMADOL HCL 50 MG PO TABS: 50.0000 mg | ORAL_TABLET | Freq: Two times a day (BID) | ORAL | 0 refills | Status: AC | PRN

## 2017-09-01 NOTE — Progress Notes (Signed)
Patient ID: Richard Arnold, male    DOB: 12/02/1950, 67 y.o.   MRN: [id number]  PCP: Susy Frizzle, MD  Chief Complaint  Patient presents with  . Foot Injury    Patient has c/o pain to ball of foot and big toe. Was playing pickelball.    Subjective:   Richard Arnold is a 67 y.o. male, presents to clinic with CC of right foot pain and swelling located on ball of his foot in the center, onset gradual last night when playing pickelball at church.  He said he felt something when he planted his foot and went to push off, it wasn't a pop and there was no immediate pain, but it gradually felt uncomfortable after that.  Again he is playing his describes somewhat like tennis where you run and pivot short distances.  He states he is wearing new balance tennis shoes with a lot of support and playing this on a basketball court.  He denies any prior injuries to this area, no history of arthritis or gout.  Did not have the pain last night this morning it was very swollen and distal right second and third metatarsal area, hurts severely to walk or bear weight or to move his toes.  Throughout the day today gradually worsened with a throbbing type pain is currently rated 5 out of 10 and is much more severe with any of those exacerbating activities.  Pain also seems to have spread out to the whole plantar surface of his foot and is wrapping around his right first MTP joint.  No tx attempted for the pain and swelling.  He has not iced it, elevated it, rest of it or taking any medicines.  He denies any redness, warmth, numbness, tingling.   Patient Active Problem List   Diagnosis Date Noted  . S/P right inguinal hernia repair July 2015 08/30/2013  . [place]-Turner syndrome-left upper extremity 08/22/2013  . Acute blood loss anemia 09/05/2011  . Osteoarthrosis, unspecified whether generalized or localized, shoulder region 01/30/2011     Prior to Admission medications   Medication Sig Start Date End Date  Taking? Authorizing Provider  aspirin EC 81 MG tablet Take 81 mg by mouth at bedtime.    Yes [provider]  atorvastatin (LIPITOR) 20 MG tablet Take 1 tablet (20 mg total) by mouth daily at 6 PM. 04/22/17  Yes Pickard, Cammie Mcgee, MD  fish oil-omega-3 fatty acids 1000 MG capsule Take 2 g by mouth daily.    Yes [provider]  temazepam (RESTORIL) 30 MG capsule Take 1 capsule (30 mg total) by mouth at bedtime. 04/23/17  Yes Susy Frizzle, MD  traMADol (ULTRAM) 50 MG tablet Take 1 tablet (50 mg total) by mouth every 12 (twelve) hours as needed for up to 3 days for severe pain. 09/01/17 09/04/17  Delsa Grana, PA-C     Allergies  Allergen Reactions  . Monosodium Glutamate Swelling    Rash, throat swells  . Tylox [Oxycodone-Acetaminophen]     Pt states he had a "weird feeling"  . Latex Rash     Family History  Problem Relation Age of Onset  . Heart disease Mother   . Heart disease Father      Social History   Socioeconomic History  . Marital status: Married    Spouse name: Not on file  . Number of children: Not on file  . Years of education: Not on file  . Highest education level: Not  on file  Occupational History  . Not on file  Social Needs  . Financial resource strain: Not on file  . Food insecurity:    Worry: Not on file    Inability: Not on file  . Transportation needs:    Medical: Not on file    Non-medical: Not on file  Tobacco Use  . Smoking status: Never Smoker  . Smokeless tobacco: Never Used  Substance and Sexual Activity  . Alcohol use: No  . Drug use: No  . Sexual activity: Not on file  Lifestyle  . Physical activity:    Days per week: Not on file    Minutes per session: Not on file  . Stress: Not on file  Relationships  . Social connections:    Talks on phone: Not on file    Gets together: Not on file    Attends religious service: Not on file    Active member of club or organization: Not on file    Attends meetings of clubs or  organizations: Not on file    Relationship status: Not on file  . Intimate partner violence:    Fear of current or ex partner: Not on file    Emotionally abused: Not on file    Physically abused: Not on file    Forced sexual activity: Not on file  Other Topics Concern  . Not on file  Social History Narrative  . Not on file     Review of Systems  Constitutional: Negative.   Skin: Negative.   Neurological: Negative.  Negative for weakness and numbness.  Hematological: Negative.   All other systems reviewed and are negative.      Objective:    Vitals:   09/01/17 1225  BP: 136/82  Pulse: 77  Resp: 16  Temp: 98.3 F (36.8 C)  TempSrc: Oral  SpO2: 98%  Weight: 203 lb (92.1 kg)  Height: 5\' 7"  (1.702 m)      Physical Exam  Constitutional: He appears well-developed and well-nourished. No distress.  Appears uncomfortable  HENT:  Head: Normocephalic and atraumatic.  Eyes: Conjunctivae are normal. Right eye exhibits no discharge. Left eye exhibits no discharge.  Neck: No tracheal deviation present.  Cardiovascular: Normal rate and regular rhythm.  Pulmonary/Chest: Effort normal. No stridor. No respiratory distress.  Musculoskeletal:       Right ankle: He exhibits normal range of motion, no swelling, no ecchymosis, no deformity, no laceration and normal pulse. No tenderness. Achilles tendon normal. Achilles tendon exhibits normal Thompson's test results.       Right foot: There is decreased range of motion, tenderness, bony tenderness and swelling. There is normal capillary refill, no crepitus and no deformity.       Feet:  Plantar foot ttp (to circled area) and swelling to distal 2nd and 3rd MT Normal passive ROM of right toes, but decreased active flexion of right toe 2 and 3.   No erythema or edema to right 1st MTP joint Dorsal right foot appears normal, no edema, no erythema, no deformity, no bruising Normal capillary refill Normal sensation to light touch Pt cannot  bear weight to right foot except for to his heel Antalgic gait  Neurological: He is alert. He exhibits normal muscle tone. Coordination normal.  Skin: Skin is warm and dry. No rash noted. He is not diaphoretic. No erythema.  Psychiatric: He has a normal mood and affect. His behavior is normal.  Nursing note and vitals reviewed.  Assessment & Plan:      ICD-10-CM   1. Right foot pain M79.671 DG Foot Complete Right  Injury last night while playing pickle ball, patient planted his foot and was getting ready to push off when he felt some kind of discomfort, this morning as his foot was dependent pain and swelling have become gradually more severe.  On exam he has visible asymmetry when inspecting the bottom of both feet right distal second and third metatarsal area is enlarged some edema tenderness to palpation he has decreased active range of motion of his right toes, but has normal passive range of motion.  No ecchymosis, normal sensation, capillary refill.  Give patient prescription for crutches and list of DME supply stores, tramadol for severe pain after using Tylenol and Aleve, obtain foot x-rays.  Patient is with his wife going to [facility] imaging to get those done right now.    Otherwise patient was instructed to rest with crutches, elevate, ice.    Delsa Grana, PA-C 09/01/17 12:50 PM

## 2017-09-01 NOTE — Patient Instructions (Signed)
Foot Pain Many things can cause foot pain. Some common causes are:  An injury.  A sprain.  Arthritis.  Blisters.  Bunions.  Follow these instructions at home: Pay attention to any changes in your symptoms. Take these actions to help with your discomfort:  If directed, put ice on the affected area: ? Put ice in a plastic bag. ? Place a towel between your skin and the bag. ? Leave the ice on for 15-20 minutes, 3?4 times a day for 2 days.  Take over-the-counter and prescription medicines only as told by your health care provider.  Wear comfortable, supportive shoes that fit you well. Do not wear high heels.  Do not stand or walk for long periods of time.  Do not lift a lot of weight. This can put added pressure on your feet.  Do stretches to relieve foot pain and stiffness as told by your health care provider.  Rub your foot gently.  Keep your feet clean and dry.  Contact a health care provider if:  Your pain does not get better after a few days of self-care.  Your pain gets worse.  You cannot stand on your foot. Get help right away if:  Your foot is numb or tingling.  Your foot or toes are swollen.  Your foot or toes turn white or blue.  You have warmth and redness along your foot. This information is not intended to replace advice given to you by your health care provider. Make sure you discuss any questions you have with your health care provider. Document Released: 02/22/2015 Document Revised: 07/04/2015 Document Reviewed: 02/21/2014 Elsevier Interactive Patient Education  2018 [employer]  

## 2017-09-01 NOTE — Progress Notes (Signed)
Please call pt with xray results-  Negative for bone abnormality, no fracture, no dislocation.  Continue to rest, ice, elevate leg throughout the day.  It may be a soft tissue or connective tissue injury that would gradually get better.  If not improving in 1 week we can send you to a foot specialist.  Please come in or call if it worsens.

## 2017-09-06 ENCOUNTER — Telehealth: Payer: Self-pay | Admitting: Family Medicine

## 2017-09-06 DIAGNOSIS — M79671 Pain in right foot: Secondary | ICD-10-CM

## 2017-09-06 NOTE — Telephone Encounter (Signed)
Patient was seen on 09/01/17 with c/o of right foot pain. Xray came back and was negative for fracture and dislocation. Patient called in today stating that foot is still swollen on the bottom, and he has limited range of motion to right great toe and right second toe. States he can move them but not as much as usually. Patient would like to know what the next steps would be. Would like a referral to orthopedics if you recommend it. Please advise?

## 2017-09-06 NOTE — Telephone Encounter (Signed)
OK with orthopedic consult.

## 2017-09-07 DIAGNOSIS — M25571 Pain in right ankle and joints of right foot: Secondary | ICD-10-CM | POA: Diagnosis not present

## 2017-09-20 DIAGNOSIS — M25571 Pain in right ankle and joints of right foot: Secondary | ICD-10-CM | POA: Diagnosis not present

## 2017-09-23 DIAGNOSIS — M79671 Pain in right foot: Secondary | ICD-10-CM | POA: Diagnosis not present

## 2017-09-28 DIAGNOSIS — M25571 Pain in right ankle and joints of right foot: Secondary | ICD-10-CM | POA: Diagnosis not present

## 2017-09-28 DIAGNOSIS — M79671 Pain in right foot: Secondary | ICD-10-CM | POA: Diagnosis not present

## 2017-10-12 DIAGNOSIS — M79674 Pain in right toe(s): Secondary | ICD-10-CM | POA: Diagnosis not present

## 2017-10-26 DIAGNOSIS — M2011 Hallux valgus (acquired), right foot: Secondary | ICD-10-CM | POA: Diagnosis not present

## 2017-11-03 DIAGNOSIS — M2041 Other hammer toe(s) (acquired), right foot: Secondary | ICD-10-CM | POA: Diagnosis not present

## 2017-11-03 DIAGNOSIS — M2011 Hallux valgus (acquired), right foot: Secondary | ICD-10-CM | POA: Diagnosis not present

## 2017-11-03 DIAGNOSIS — M205X1 Other deformities of toe(s) (acquired), right foot: Secondary | ICD-10-CM | POA: Diagnosis not present

## 2017-11-03 DIAGNOSIS — M24574 Contracture, right foot: Secondary | ICD-10-CM | POA: Diagnosis not present

## 2017-11-03 DIAGNOSIS — S93144A Subluxation of metatarsophalangeal joint of right lesser toe(s), initial encounter: Secondary | ICD-10-CM | POA: Diagnosis not present

## 2017-11-03 DIAGNOSIS — M2021 Hallux rigidus, right foot: Secondary | ICD-10-CM | POA: Diagnosis not present

## 2017-11-03 DIAGNOSIS — M898X7 Other specified disorders of bone, ankle and foot: Secondary | ICD-10-CM | POA: Diagnosis not present

## 2017-11-03 DIAGNOSIS — G8918 Other acute postprocedural pain: Secondary | ICD-10-CM | POA: Diagnosis not present

## 2017-11-03 DIAGNOSIS — S96011A Strain of muscle and tendon of long flexor muscle of toe at ankle and foot level, right foot, initial encounter: Secondary | ICD-10-CM | POA: Diagnosis not present

## 2017-11-03 DIAGNOSIS — S96111A Strain of muscle and tendon of long extensor muscle of toe at ankle and foot level, right foot, initial encounter: Secondary | ICD-10-CM | POA: Diagnosis not present

## 2017-11-11 DIAGNOSIS — R69 Illness, unspecified: Secondary | ICD-10-CM | POA: Diagnosis not present

## 2017-11-15 ENCOUNTER — Encounter: Payer: Self-pay | Admitting: Family Medicine

## 2017-11-15 ENCOUNTER — Other Ambulatory Visit: Payer: Self-pay | Admitting: Family Medicine

## 2017-11-15 MED ORDER — TEMAZEPAM 30 MG PO CAPS: 30.0000 mg | ORAL_CAPSULE | Freq: Every day | ORAL | 1 refills | Status: DC

## 2017-11-15 NOTE — Telephone Encounter (Signed)
Requesting refill    Temazepam  LOV: 09/01/17  LRF:  04/23/17

## 2017-11-15 NOTE — Telephone Encounter (Signed)
This encounter was created in error - please disregard.

## 2017-11-17 DIAGNOSIS — M2011 Hallux valgus (acquired), right foot: Secondary | ICD-10-CM | POA: Diagnosis not present

## 2017-12-07 DIAGNOSIS — M2011 Hallux valgus (acquired), right foot: Secondary | ICD-10-CM | POA: Diagnosis not present

## 2017-12-15 DIAGNOSIS — M2011 Hallux valgus (acquired), right foot: Secondary | ICD-10-CM | POA: Diagnosis not present

## 2018-01-12 DIAGNOSIS — M2011 Hallux valgus (acquired), right foot: Secondary | ICD-10-CM | POA: Diagnosis not present

## 2018-01-19 DIAGNOSIS — L570 Actinic keratosis: Secondary | ICD-10-CM | POA: Diagnosis not present

## 2018-01-19 DIAGNOSIS — D2262 Melanocytic nevi of left upper limb, including shoulder: Secondary | ICD-10-CM | POA: Diagnosis not present

## 2018-01-19 DIAGNOSIS — D225 Melanocytic nevi of trunk: Secondary | ICD-10-CM | POA: Diagnosis not present

## 2018-01-19 DIAGNOSIS — D1801 Hemangioma of skin and subcutaneous tissue: Secondary | ICD-10-CM | POA: Diagnosis not present

## 2018-01-19 DIAGNOSIS — L814 Other melanin hyperpigmentation: Secondary | ICD-10-CM | POA: Diagnosis not present

## 2018-01-19 DIAGNOSIS — L821 Other seborrheic keratosis: Secondary | ICD-10-CM | POA: Diagnosis not present

## 2018-01-19 DIAGNOSIS — D2261 Melanocytic nevi of right upper limb, including shoulder: Secondary | ICD-10-CM | POA: Diagnosis not present

## 2018-01-19 DIAGNOSIS — Z85828 Personal history of other malignant neoplasm of skin: Secondary | ICD-10-CM | POA: Diagnosis not present

## 2018-03-16 DIAGNOSIS — M2011 Hallux valgus (acquired), right foot: Secondary | ICD-10-CM | POA: Diagnosis not present

## 2018-04-19 ENCOUNTER — Other Ambulatory Visit: Payer: Self-pay | Admitting: *Deleted

## 2018-04-19 MED ORDER — ATORVASTATIN CALCIUM 20 MG PO TABS: 20.0000 mg | ORAL_TABLET | Freq: Every day | ORAL | 3 refills | Status: DC

## 2018-04-26 ENCOUNTER — Ambulatory Visit (INDEPENDENT_AMBULATORY_CARE_PROVIDER_SITE_OTHER): Payer: Medicare HMO | Admitting: Family Medicine

## 2018-04-26 ENCOUNTER — Other Ambulatory Visit: Payer: Self-pay

## 2018-04-26 ENCOUNTER — Encounter: Payer: Self-pay | Admitting: Family Medicine

## 2018-04-26 VITALS — BP 136/80 | HR 80 | Temp 98.5°F | Resp 16 | Ht 67.0 in | Wt 196.0 lb

## 2018-04-26 DIAGNOSIS — Z0000 Encounter for general adult medical examination without abnormal findings: Secondary | ICD-10-CM

## 2018-04-26 DIAGNOSIS — Z125 Encounter for screening for malignant neoplasm of prostate: Secondary | ICD-10-CM

## 2018-04-26 DIAGNOSIS — G545 Neuralgic amyotrophy: Secondary | ICD-10-CM

## 2018-04-26 DIAGNOSIS — E7800 Pure hypercholesterolemia, unspecified: Secondary | ICD-10-CM

## 2018-04-26 MED ORDER — MELOXICAM 15 MG PO TABS: 15.0000 mg | ORAL_TABLET | Freq: Every day | ORAL | 0 refills | Status: DC

## 2018-04-26 NOTE — Progress Notes (Signed)
Subjective:    Patient ID: Richard Arnold, male    DOB: [birth year], 68 y.o.   MRN: [id number]  HPI Patient is a very pleasant 68 year old Caucasian male here today to establish care and for physical exam. Past medical history is significant for hyperlipidemia. 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 Patient states that he has had MRIs and nerve conduction studies and has seen neurology for this. He continues to have some numbness in the distribution of the ulnar nerve nucleus fourth and fifth digits.  He also has a history of recurrent shoulder surgery in his right shoulder. He is undergone several arthroscopies and ultimately underwent a shoulder replacement in the right shoulder.  Last colonoscopy was 2017.  Patient is not due again for colonoscopy until 2027.  He is due for prostate cancer screening today with a PSA.  He had Prevnar 13 last year.  He is due for Pneumovax 23 this year.  He is also due for Shingrix.  He is due for a tetanus shot however he declined this today. Past Medical History:  Diagnosis Date  . Arthritis   . Brachial neuritis   . Hypercholesteremia   . Turner syndrome    left arm, some numbness in fingers   Past Surgical History:  Procedure Laterality Date  . COLONOSCOPY WITH PROPOFOL N/A 03/05/2015   Procedure: COLONOSCOPY WITH PROPOFOL;  Surgeon: Garlan Fair, MD;  Location: [facility];  Service: Endoscopy;  Laterality: N/A;  . ESOPHAGOGASTRODUODENOSCOPY  09/08/2011   Procedure: ESOPHAGOGASTRODUODENOSCOPY (EGD);  Surgeon: Winfield Cunas., MD;  Location: [facility];  Service: Endoscopy;  Laterality: N/A;  . HERNIA REPAIR    . INGUINAL HERNIA REPAIR Right 08/30/2013   Procedure: HERNIA REPAIR INGUINAL WITH MESH  ADULT;  Surgeon: Pedro Earls, MD;  Location: [facility];  Service: General;  Laterality: Right;  . SHOULDER ARTHROSCOPY  2011   x4  one open rt, rotator cuff tear, Dr. Amedeo Plenty  . TOTAL SHOULDER ARTHROPLASTY  01/30/2011    Procedure: TOTAL SHOULDER ARTHROPLASTY;  Surgeon: Augustin Schooling;  Location: [facility];  Service: Orthopedics;  Laterality: Right;  right total shoulder atrhoplasty   Current Outpatient Medications on File Prior to Visit  Medication Sig Dispense Refill  . aspirin EC 81 MG tablet Take 81 mg by mouth at bedtime.     Marland Kitchen atorvastatin (LIPITOR) 20 MG tablet Take 1 tablet (20 mg total) by mouth daily at 6 PM. 90 tablet 3  . fish oil-omega-3 fatty acids 1000 MG capsule Take 2 g by mouth daily.     Marland Kitchen gabapentin (NEURONTIN) 300 MG capsule TAKE 1 CAPSULE BY MOUTH EVERYDAY AT BEDTIME    . temazepam (RESTORIL) 30 MG capsule Take 1 capsule (30 mg total) by mouth at bedtime. 90 capsule 1   No current facility-administered medications on file prior to visit.    Allergies  Allergen Reactions  . Monosodium Glutamate Swelling    Rash, throat swells  . Tylox [Oxycodone-Acetaminophen]     Pt states he had a "weird feeling"  . Latex Rash   Social History   Socioeconomic History  . Marital status: Married    Spouse name: Not on file  . Number of children: Not on file  . Years of education: Not on file  . Highest education level: Not on file  Occupational History  . Not on file  Social Needs  . Financial resource strain: Not on file  . Food  insecurity:    Worry: Not on file    Inability: Not on file  . Transportation needs:    Medical: Not on file    Non-medical: Not on file  Tobacco Use  . Smoking status: Never Smoker  . Smokeless tobacco: Never Used  Substance and Sexual Activity  . Alcohol use: No  . Drug use: No  . Sexual activity: Not on file  Lifestyle  . Physical activity:    Days per week: Not on file    Minutes per session: Not on file  . Stress: Not on file  Relationships  . Social connections:    Talks on phone: Not on file    Gets together: Not on file    Attends religious service: Not on file    Active member of club or organization: Not on file    Attends meetings of  clubs or organizations: Not on file    Relationship status: Not on file  . Intimate partner violence:    Fear of current or ex partner: Not on file    Emotionally abused: Not on file    Physically abused: Not on file    Forced sexual activity: Not on file  Other Topics Concern  . Not on file  Social History Narrative  . Not on file   Family History  Problem Relation Age of Onset  . Heart disease Mother   . Heart disease Father    Family history is significant for a history of CABG in his father later in life   Review of Systems  All other systems reviewed and are negative.      Objective:   Physical Exam  Constitutional: He is oriented to person, place, and time. He appears well-developed and well-nourished. No distress.  HENT:  Head: Normocephalic and atraumatic.  Right Ear: External ear normal.  Left Ear: External ear normal.  Nose: Nose normal.  Mouth/Throat: Oropharynx is clear and moist. No oropharyngeal exudate.  Eyes: Pupils are equal, round, and reactive to light. Conjunctivae and EOM are normal. Right eye exhibits no discharge. Left eye exhibits no discharge. No scleral icterus.  Neck: Normal range of motion. Neck supple. No JVD present. No tracheal deviation present. No thyromegaly present.  Cardiovascular: Normal rate, regular rhythm, normal heart sounds and intact distal pulses. Exam reveals no gallop and no friction rub.  No murmur heard. Pulmonary/Chest: Effort normal and breath sounds normal. No stridor. No respiratory distress. He has no wheezes. He has no rales. He exhibits no tenderness.  Abdominal: Soft. Bowel sounds are normal. He exhibits no distension and no mass. There is no abdominal tenderness. There is no rebound and no guarding.  Genitourinary:    Prostate, penis and rectum normal.   Musculoskeletal: Normal range of motion.        General: No tenderness, deformity or edema.  Lymphadenopathy:    He has no cervical adenopathy.  Neurological: He  is alert and oriented to person, place, and time. He has normal reflexes. No cranial nerve deficit. He exhibits normal muscle tone. Coordination normal.  Skin: Skin is warm. No rash noted. He is not diaphoretic. No erythema. No pallor.  Psychiatric: He has a normal mood and affect. His behavior is normal. Judgment and thought content normal.  Vitals reviewed.         Assessment & Plan:  General medical exam  Prostate cancer screening - Plan: PSA  [place]-Turner syndrome-left upper extremity  Pure hypercholesterolemia - Plan: CBC with Differential/Platelet, COMPLETE METABOLIC  PANEL WITH GFR, Lipid panel  Physical exam today is normal outside of the neuropathy in his left arm.  Patient received Pneumovax 23 today.  I encouraged him to get Shingrix at his local pharmacy.  He declined a tetanus shot.  Flu shot is up-to-date.  I will screen the patient for prostate cancer with a PSA.  I will also have him return fasting for CBC, CMP, and fasting lipid panel.  Colonoscopy is up-to-date.  He has never smoked and therefore does not require lung cancer screening or AAA screening.  He denies any issues with falls, depression, or memory loss.  His functional assessment today is completely normal.  He does report occasional trouble falling asleep but that is the reason he takes Restoril.

## 2018-04-27 ENCOUNTER — Other Ambulatory Visit: Payer: Medicare HMO

## 2018-04-27 DIAGNOSIS — Z0000 Encounter for general adult medical examination without abnormal findings: Secondary | ICD-10-CM

## 2018-04-27 DIAGNOSIS — E7800 Pure hypercholesterolemia, unspecified: Secondary | ICD-10-CM | POA: Diagnosis not present

## 2018-04-27 DIAGNOSIS — G545 Neuralgic amyotrophy: Secondary | ICD-10-CM | POA: Diagnosis not present

## 2018-04-27 DIAGNOSIS — Z125 Encounter for screening for malignant neoplasm of prostate: Secondary | ICD-10-CM | POA: Diagnosis not present

## 2018-04-28 LAB — LIPID PANEL
Cholesterol: 125 mg/dL (ref <200)
HDL: 39 mg/dL — AB (ref >=40)
LDL Cholesterol (Calc): 64 mg/dL (calc)
Non-HDL Cholesterol (Calc): 86 mg/dL (calc) (ref <130)
Total CHOL/HDL Ratio: 3.2 (calc) (ref <5.0)
Triglycerides: 141 mg/dL (ref <150)

## 2018-04-28 LAB — CBC WITH DIFFERENTIAL/PLATELET
Absolute Monocytes: 634 cells/uL (ref 200–950)
BASOS ABS: 51 {cells}/uL (ref 0–200)
BASOS PCT: 0.8 %
Eosinophils Absolute: 243 cells/uL (ref 15–500)
Eosinophils Relative: 3.8 %
HEMATOCRIT: 42.9 % (ref 38.5–50.0)
HEMOGLOBIN: 14.5 g/dL (ref 13.2–17.1)
LYMPHS ABS: 1875 {cells}/uL (ref 850–3900)
MCH: 30.4 pg (ref 27.0–33.0)
MCHC: 33.8 g/dL (ref 32.0–36.0)
MCV: 89.9 fL (ref 80.0–100.0)
MPV: 11.6 fL (ref 7.5–12.5)
Monocytes Relative: 9.9 %
NEUTROS ABS: 3597 {cells}/uL (ref 1500–7800)
Neutrophils Relative %: 56.2 %
Platelets: 215 10*3/uL (ref 140–400)
RBC: 4.77 10*6/uL (ref 4.20–5.80)
RDW: 13.1 % (ref 11.0–15.0)
Total Lymphocyte: 29.3 %
WBC: 6.4 10*3/uL (ref 3.8–10.8)

## 2018-04-28 LAB — COMPREHENSIVE METABOLIC PANEL
AG Ratio: 1.7 (calc) (ref 1.0–2.5)
ALT: 18 U/L (ref 9–46)
AST: 18 U/L (ref 10–35)
Albumin: 4.2 g/dL (ref 3.6–5.1)
Alkaline phosphatase (APISO): 72 U/L (ref 35–144)
BUN: 17 mg/dL (ref 7–25)
CO2: 26 mmol/L (ref 20–32)
Calcium: 9.4 mg/dL (ref 8.6–10.3)
Chloride: 104 mmol/L (ref 98–110)
Creat: 1.16 mg/dL (ref 0.70–1.25)
Globulin: 2.5 g/dL (calc) (ref 1.9–3.7)
Glucose, Bld: 81 mg/dL (ref 65–99)
Potassium: 4.6 mmol/L (ref 3.5–5.3)
SODIUM: 140 mmol/L (ref 135–146)
Total Bilirubin: 0.6 mg/dL (ref 0.2–1.2)
Total Protein: 6.7 g/dL (ref 6.1–8.1)

## 2018-04-28 LAB — PSA: PSA: 0.3 ng/mL (ref <=4.0)

## 2018-05-13 ENCOUNTER — Other Ambulatory Visit: Payer: Self-pay | Admitting: Family Medicine

## 2018-05-13 MED ORDER — TEMAZEPAM 30 MG PO CAPS: 30.0000 mg | ORAL_CAPSULE | Freq: Every day | ORAL | 1 refills | Status: DC

## 2018-05-13 NOTE — Telephone Encounter (Signed)
Patient is calling to get refill on his temazepam  [facility] send to [facility]

## 2018-05-13 NOTE — Telephone Encounter (Signed)
Requesting refill     Temazepam  LOV: 04/26/18  LRF:  11/15/17

## 2018-05-16 ENCOUNTER — Telehealth: Payer: Self-pay | Admitting: Family Medicine

## 2018-05-16 NOTE — Telephone Encounter (Signed)
PATIENT CALLING AGAIN REGARDING TEMAZEPAM  HE SAYS IT IS URGENT THAT HE GET THIS [phone]

## 2018-05-16 NOTE — Telephone Encounter (Signed)
This was a VM left Friday 05/13/18 and the medication was sent in with confimation. Pt in office this morning and was given confirmation. Will resend if needed.  temazepam (RESTORIL) 30 MG capsule 90 capsule 1 05/13/2018    Sig - Route: Take 1 capsule (30 mg total) by mouth at bedtime. - Oral   Sent to pharmacy as: temazepam (RESTORIL) 30 MG capsule   E-Prescribing Status: Receipt confirmed by pharmacy (05/13/2018 10:00 AM EDT)   Pharmacy   [id number] - [place], [place] - [address]

## 2018-05-19 ENCOUNTER — Other Ambulatory Visit: Payer: Self-pay | Admitting: Family Medicine

## 2018-05-19 ENCOUNTER — Encounter (HOSPITAL_COMMUNITY): Payer: Self-pay

## 2018-05-19 ENCOUNTER — Encounter: Payer: Self-pay | Admitting: Family Medicine

## 2018-05-19 ENCOUNTER — Ambulatory Visit (INDEPENDENT_AMBULATORY_CARE_PROVIDER_SITE_OTHER): Payer: Medicare HMO | Admitting: Family Medicine

## 2018-05-19 ENCOUNTER — Other Ambulatory Visit: Payer: Self-pay

## 2018-05-19 ENCOUNTER — Inpatient Hospital Stay (HOSPITAL_COMMUNITY)
Admission: EM | Admit: 2018-05-19 | Discharge: 2018-05-21 | DRG: 378 | Disposition: A | Discharge status: Home / Self Care | Payer: Medicare HMO | Attending: Internal Medicine | Admitting: Internal Medicine

## 2018-05-19 VITALS — BP 110/58 | HR 82 | Temp 98.3°F | Resp 16

## 2018-05-19 DIAGNOSIS — R42 Dizziness and giddiness: Secondary | ICD-10-CM | POA: Diagnosis not present

## 2018-05-19 DIAGNOSIS — D62 Acute posthemorrhagic anemia: Secondary | ICD-10-CM | POA: Diagnosis present

## 2018-05-19 DIAGNOSIS — Z791 Long term (current) use of non-steroidal anti-inflammatories (NSAID): Secondary | ICD-10-CM

## 2018-05-19 DIAGNOSIS — Z7982 Long term (current) use of aspirin: Secondary | ICD-10-CM

## 2018-05-19 DIAGNOSIS — K254 Chronic or unspecified gastric ulcer with hemorrhage: Principal | ICD-10-CM | POA: Diagnosis present

## 2018-05-19 DIAGNOSIS — K921 Melena: Secondary | ICD-10-CM | POA: Diagnosis not present

## 2018-05-19 DIAGNOSIS — R195 Other fecal abnormalities: Secondary | ICD-10-CM

## 2018-05-19 DIAGNOSIS — E785 Hyperlipidemia, unspecified: Secondary | ICD-10-CM | POA: Diagnosis present

## 2018-05-19 DIAGNOSIS — Z8711 Personal history of peptic ulcer disease: Secondary | ICD-10-CM

## 2018-05-19 DIAGNOSIS — E7800 Pure hypercholesterolemia, unspecified: Secondary | ICD-10-CM | POA: Diagnosis present

## 2018-05-19 DIAGNOSIS — Z79899 Other long term (current) drug therapy: Secondary | ICD-10-CM

## 2018-05-19 DIAGNOSIS — K922 Gastrointestinal hemorrhage, unspecified: Secondary | ICD-10-CM | POA: Diagnosis not present

## 2018-05-19 DIAGNOSIS — G4700 Insomnia, unspecified: Secondary | ICD-10-CM | POA: Diagnosis present

## 2018-05-19 DIAGNOSIS — Z96611 Presence of right artificial shoulder joint: Secondary | ICD-10-CM | POA: Diagnosis present

## 2018-05-19 HISTORY — DX: Gastrointestinal hemorrhage, unspecified: K92.2

## 2018-05-19 HISTORY — DX: Peptic ulcer, site unspecified, unspecified as acute or chronic, without hemorrhage or perforation: K27.9

## 2018-05-19 HISTORY — DX: Helicobacter pylori (H. pylori) as the cause of diseases classified elsewhere: B96.81

## 2018-05-19 HISTORY — DX: Gastric ulcer, unspecified as acute or chronic, without hemorrhage or perforation: K25.9

## 2018-05-19 LAB — COMPREHENSIVE METABOLIC PANEL
ALT: 19 U/L (ref 0–44)
AST: 20 U/L (ref 15–41)
Albumin: 3.7 g/dL (ref 3.5–5.0)
Alkaline Phosphatase: 47 U/L (ref 38–126)
Anion gap: 11 (ref 5–15)
BUN: 34 mg/dL — ABNORMAL HIGH (ref 8–23)
CO2: 22 mmol/L (ref 22–32)
Calcium: 9.4 mg/dL (ref 8.9–10.3)
Chloride: 105 mmol/L (ref 98–111)
Creatinine, Ser: 1.11 mg/dL (ref 0.61–1.24)
GFR calc Af Amer: >60 mL/min (ref >60)
GFR calc non Af Amer: >60 mL/min (ref >60)
Glucose, Bld: 122 mg/dL — ABNORMAL HIGH (ref 70–99)
Potassium: 4.2 mmol/L (ref 3.5–5.1)
Sodium: 138 mmol/L (ref 135–145)
Total Bilirubin: 0.6 mg/dL (ref 0.3–1.2)
Total Protein: 6.3 g/dL — ABNORMAL LOW (ref 6.5–8.1)

## 2018-05-19 LAB — PROTIME-INR
INR: 1.1 (ref 0.8–1.2)
Prothrombin Time: 14 seconds (ref 11.4–15.2)

## 2018-05-19 LAB — HEMOGLOBIN
Hemoglobin: 7.8 g/dL — ABNORMAL LOW (ref 13.0–17.0)
Hemoglobin: 7.3 g/dL — ABNORMAL LOW (ref 13.0–17.0)

## 2018-05-19 LAB — CBC WITH DIFFERENTIAL/PLATELET
Abs Immature Granulocytes: 0.04 10*3/uL (ref 0.00–0.07)
Basophils Absolute: 0 10*3/uL (ref 0.0–0.1)
Basophils Relative: 0 %
Eosinophils Absolute: 0 10*3/uL (ref 0.0–0.5)
Eosinophils Relative: 0 %
HCT: 27.7 % — ABNORMAL LOW (ref 39.0–52.0)
Hemoglobin: 9.3 g/dL — ABNORMAL LOW (ref 13.0–17.0)
Immature Granulocytes: 0 %
Lymphocytes Relative: 22 %
Lymphs Abs: 2.2 10*3/uL (ref 0.7–4.0)
MCH: 31.2 pg (ref 26.0–34.0)
MCHC: 33.6 g/dL (ref 30.0–36.0)
MCV: 93 fL (ref 80.0–100.0)
Monocytes Absolute: 0.7 10*3/uL (ref 0.1–1.0)
Monocytes Relative: 7 %
Neutro Abs: 7 10*3/uL (ref 1.7–7.7)
Neutrophils Relative %: 71 %
Platelets: 203 10*3/uL (ref 150–400)
RBC: 2.98 MIL/uL — ABNORMAL LOW (ref 4.22–5.81)
RDW: 13.3 % (ref 11.5–15.5)
WBC: 9.9 10*3/uL (ref 4.0–10.5)
nRBC: 0 % (ref 0.0–0.2)

## 2018-05-19 LAB — POC OCCULT BLOOD, ED: Fecal Occult Bld: POSITIVE — AB

## 2018-05-19 LAB — HEMOGLOBIN, FINGERSTICK: POC HEMOGLOBIN: 8.6 g/dL — ABNORMAL LOW (ref 13.0–17.0)

## 2018-05-19 LAB — HEMATOCRIT
HCT: 23 % — ABNORMAL LOW (ref 39.0–52.0)
HCT: 21.3 % — ABNORMAL LOW (ref 39.0–52.0)

## 2018-05-19 MED ORDER — ONDANSETRON HCL 4 MG PO TABS: 4.0000 mg | ORAL_TABLET | Freq: Four times a day (QID) | ORAL | Status: DC | PRN

## 2018-05-19 MED ORDER — SODIUM CHLORIDE 0.9 % IV SOLN: Freq: Once | INTRAVENOUS | Status: DC

## 2018-05-19 MED ORDER — SODIUM CHLORIDE 0.9 % IV SOLN
80.0000 mg | Freq: Once | INTRAVENOUS | Status: AC
  Administered 2018-05-19: 80 mg via INTRAVENOUS
  Filled 2018-05-19: qty 80

## 2018-05-19 MED ORDER — TEMAZEPAM 15 MG PO CAPS
30.0000 mg | ORAL_CAPSULE | Freq: Every day | ORAL | Status: DC
  Administered 2018-05-19 – 2018-05-20 (×2): 30 mg via ORAL
  Filled 2018-05-19 (×2): qty 2

## 2018-05-19 MED ORDER — SODIUM CHLORIDE 0.9 % IV SOLN
8.0000 mg/h | INTRAVENOUS | Status: DC
  Administered 2018-05-19 – 2018-05-21 (×5): 8 mg/h via INTRAVENOUS
  Filled 2018-05-19 (×6): qty 80

## 2018-05-19 MED ORDER — SODIUM CHLORIDE 0.9 % IV SOLN
INTRAVENOUS | Status: DC
  Administered 2018-05-19 – 2018-05-20 (×2): via INTRAVENOUS

## 2018-05-19 MED ORDER — ONDANSETRON HCL 4 MG/2ML IJ SOLN: 4.0000 mg | Freq: Four times a day (QID) | INTRAMUSCULAR | Status: DC | PRN

## 2018-05-19 NOTE — Consult Note (Signed)
[facility] Reason for consult: Melena and drop in hemoglobin Referring Physician: Emergency room.  PCP: Dr. Jenna Luo.  Primary GI: Dr. Estevan Ryder. is an 68 y.o. male.  HPI: He is a 68 year old patient whom has been followed by [facility] for some time.  He is seen Dr. Earle Gell and previously saw Dr. Hillard Danker his PCP.  I admitted him to the hospital 2013 with an upper GI bleed.  He had EGD which showed gastric ulcer biopsies positive for H. pylori.  He was treated at that time with Prevpac.  He had colonoscopy 2017 by Dr. Wynetta Emery for screening purposes and had 2 small adenomas removed from the cecum.  He is on schedule for repeat colonoscopy at 5-year interval in 2022. He states that he has been having issues with arthritis and had some type of surgical procedure recently on his foot.  He is continued to have pain and has been taking meloxicam.  He took it for 3 to 4-week.,  Stop for a while and then started back a week or 2 ago.  Denies abdominal pain, hematemesis, increased reflux etc.  He states that he does not take any medication chronically for reflux.  2 days ago he began to have dark tarry stools and felt somewhat weak.  Up until this time he has been feeling fairly well.  He takes low-dose aspirin fish oil Neurontin and Restoril as needed at night.  Came to the emergency room, hemoglobin by fingerstick was 8.6, was 14.5 about a month ago.  No melena or hematochezia.  Past Medical History:  Diagnosis Date  . Arthritis   . Brachial neuritis   . Hypercholesteremia   . Turner syndrome    left arm, some numbness in fingers    Past Surgical History:  Procedure Laterality Date  . COLONOSCOPY WITH PROPOFOL N/A 03/05/2015   Procedure: COLONOSCOPY WITH PROPOFOL;  Surgeon: Garlan Fair, MD;  Location: [facility];  Service: Endoscopy;  Laterality: N/A;  . ESOPHAGOGASTRODUODENOSCOPY  09/08/2011   Procedure: ESOPHAGOGASTRODUODENOSCOPY (EGD);   Surgeon: Winfield Cunas., MD;  Location: [facility];  Service: Endoscopy;  Laterality: N/A;  . HERNIA REPAIR    . INGUINAL HERNIA REPAIR Right 08/30/2013   Procedure: HERNIA REPAIR INGUINAL WITH MESH  ADULT;  Surgeon: Pedro Earls, MD;  Location: [facility];  Service: General;  Laterality: Right;  . SHOULDER ARTHROSCOPY  2011   x4  one open rt, rotator cuff tear, Dr. Amedeo Plenty  . TOTAL SHOULDER ARTHROPLASTY  01/30/2011   Procedure: TOTAL SHOULDER ARTHROPLASTY;  Surgeon: Augustin Schooling;  Location: [facility];  Service: Orthopedics;  Laterality: Right;  right total shoulder atrhoplasty    Family History  Problem Relation Age of Onset  . Heart disease Mother   . Heart disease Father     Social History:  reports that he has never smoked. He has never used smokeless tobacco. He reports that he does not drink alcohol or use drugs.  He is a retired [employer] administrator.  Allergies:  Allergies  Allergen Reactions  . Monosodium Glutamate Swelling    Rash, throat swells  . Tylox [Oxycodone-Acetaminophen]     Pt states he had a "weird feeling"  . Latex Rash    Medications; Prior to Admission medications   Medication Sig Start Date End Date Taking? Authorizing Provider  atorvastatin (LIPITOR) 20 MG tablet Take 1 tablet (20 mg total) by mouth daily at 6 PM. 04/19/18  Yes Pickard,  Cammie Mcgee, MD  gabapentin (NEURONTIN) 300 MG capsule Take 300 mg by mouth at bedtime.  04/12/18  Yes [provider]  meloxicam (MOBIC) 15 MG tablet TAKE 1 TABLET BY MOUTH EVERY DAY Patient taking differently: Take 15 mg by mouth daily.  05/19/18  Yes Susy Frizzle, MD  temazepam (RESTORIL) 30 MG capsule Take 1 capsule (30 mg total) by mouth at bedtime. 05/13/18  Yes Susy Frizzle, MD  aspirin EC 81 MG tablet Take 81 mg by mouth at bedtime.     [provider]  fish oil-omega-3 fatty acids 1000 MG capsule Take 2 g by mouth daily.     [provider]    PRN Meds ondansetron **OR** ondansetron  (ZOFRAN) IV Results for orders placed or performed during the hospital encounter of 05/19/18 (from the past 48 hour(s))  Comprehensive metabolic panel     Status: Abnormal   Collection Time: 05/19/18 12:25 PM  Result Value Ref Range   Sodium 138 135 - 145 mmol/L   Potassium 4.2 3.5 - 5.1 mmol/L   Chloride 105 98 - 111 mmol/L   CO2 22 22 - 32 mmol/L   Glucose, Bld 122 (H) 70 - 99 mg/dL   BUN 34 (H) 8 - 23 mg/dL   Creatinine, Ser 1.11 0.61 - 1.24 mg/dL   Calcium 9.4 8.9 - 10.3 mg/dL   Total Protein 6.3 (L) 6.5 - 8.1 g/dL   Albumin 3.7 3.5 - 5.0 g/dL   AST 20 15 - 41 U/L   ALT 19 0 - 44 U/L   Alkaline Phosphatase 47 38 - 126 U/L   Total Bilirubin 0.6 0.3 - 1.2 mg/dL   GFR calc non Af Amer >60 >60 mL/min   GFR calc Af Amer >60 >60 mL/min   Anion gap 11 5 - 15    Comment: Performed at [facility], [place] [address] [place], [place] [address]  CBC WITH DIFFERENTIAL     Status: Abnormal   Collection Time: 05/19/18 12:25 PM  Result Value Ref Range   WBC 9.9 4.0 - 10.5 K/uL   RBC 2.98 (L) 4.22 - 5.81 MIL/uL   Hemoglobin 9.3 (L) 13.0 - 17.0 g/dL   HCT 27.7 (L) 39.0 - 52.0 %   MCV 93.0 80.0 - 100.0 fL   MCH 31.2 26.0 - 34.0 pg   MCHC 33.6 30.0 - 36.0 g/dL   RDW 13.3 11.5 - 15.5 %   Platelets 203 150 - 400 K/uL   nRBC 0.0 0.0 - 0.2 %   Neutrophils Relative % 71 %   Neutro Abs 7.0 1.7 - 7.7 K/uL   Lymphocytes Relative 22 %   Lymphs Abs 2.2 0.7 - 4.0 K/uL   Monocytes Relative 7 %   Monocytes Absolute 0.7 0.1 - 1.0 K/uL   Eosinophils Relative 0 %   Eosinophils Absolute 0.0 0.0 - 0.5 K/uL   Basophils Relative 0 %   Basophils Absolute 0.0 0.0 - 0.1 K/uL   Immature Granulocytes 0 %   Abs Immature Granulocytes 0.04 0.00 - 0.07 K/uL    Comment: Performed at [facility] [address] [place],  [address]  Protime-INR     Status: None   Collection Time: 05/19/18 12:25 PM  Result Value Ref Range   Prothrombin Time 14.0 11.4 - 15.2 seconds   INR 1.1 0.8 - 1.2     Comment: (NOTE) INR goal varies based on device and disease states. Performed at [facility], [place] [address] [place], [place] [address]   Type and screen [place]     Status: None   Collection Time: 05/19/18 12:25 PM  Result Value Ref Range   ABO/RH(D) O POS    Antibody Screen NEG    Sample Expiration      05/22/2018 Performed at [facility], [place] [address] [place], [place] [address]   POC occult blood, ED Provider will collect     Status: Abnormal   Collection Time: 05/19/18 12:27 PM  Result Value Ref Range   Fecal Occult Bld POSITIVE (A) NEGATIVE    No results found. ROS: Constitutional: Felt well up until recently. HEENT: Negative Cardiovascular: Negative Respiratory: Negative GI: As above GU: No dysuria Musculoskeletal: Pain in his lower extremity from recent surgery in his back.             Blood pressure 115/72, pulse 87, temperature 97.8 F (36.6 C), resp. rate 19, height 5\' 8"  (1.727 m), weight 82.6 kg, SpO2 96 %.  Physical exam:   General--white male in no acute distress ENT--patient's throat appears normal mucous membranes moist no signs of bleeding in the pharynx no signs of at the taxes.  Heart--regular rate and rhythm without murmurs or gallops Lungs--clear Abdomen--soft and nontender Psych--alert and oriented answers questions appropriately   Assessment: 1.  GI bleed.  Probably upper GI with NSAID related ulcerations.  Increased BUN with normal creatinine. 2.  History of gastric ulcer 7 years ago positive for H. pylori was treated with Prevpac 3.  History of adenomatous colon polyps due for repeat surveillance colonoscopy 2022 4.  Neuropathy left arm  Plan: 1.  Have discussed with patient.  With the current situation with coronavirus, EGDs require intubation and have become a much more involved procedure.  We are trying to do these only on an emergent basis.  Otherwise we will treat as if he had an ulcer  which is probably true related to his meloxicam use and follow-up in 6 to 8 weeks with EGD if still is having symptoms, anemia etc. 2.  Would keep on clear liquids today and n.p.o. after midnight in case he continues to have bleeding.  If this is a case we would go ahead tomorrow with EGD.  Have discussed this with patient.  Dr Therisa Doyne will be rounding on patient in the morning.   Nancy Fetter 05/19/2018, 3:20 PM   This note was created using voice recognition software and minor errors may Have occurred unintentionally. Pager: [phone] If no answer or after hours call [phone]

## 2018-05-19 NOTE — Progress Notes (Signed)
Pt admitted to 5W room 1. A&O x4. Tele monitor placed. VS stable. All questions/concerns addressed. Call bell within reach. Will continue to monitor.

## 2018-05-19 NOTE — ED Triage Notes (Signed)
Pt arrives POV from Dr. Dennard Schaumann office where he was found to have HGB of 8.4 that had fallen from 14. Pt state black tarry stools noted since Monday.

## 2018-05-19 NOTE — H&P (Signed)
History and Physical    Richard Arnold. TIW:[id number] DOB: 1950-02-17 DOA: 05/19/2018  PCP: Susy Frizzle, MD   Patient coming from: Home.  I have personally briefly reviewed patient's old medical records in [place]  Chief Complaint: Stomach bleeding.  HPI: Richard Arnold. is a 68 y.o. 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 Dennard Schaumann due to dark tarry stools since Monday, lightheadedness and decreased hemoglobin from 14 to 8.4 g/dL.  The patient recently resumed taking meloxicam last week.  He states that he started having melanotic stools since Tuesday.  He denies abdominal pain, nausea, emesis, constipation or hematochezia.  No dysuria, frequency or hematuria.  He has been lightheaded at times, but denies CP, dyspnea, palpitations, diaphoresis, PND, orthopnea or pitting edema of the lower extremities.  Denies polyuria, polydipsia, polyphagia or blurred vision.  ED Course: Initial vital signs temperature 97.8 F, pulse 99, respirations 16, blood pressure 160/83 mmHg and O2 sat 100% on room air.  The patient was given a Protonix loading dose and then was started on Protonix infusion.  He was seen by Dr. Laurence Spates from the gastroenterology service.  Fecal occult blood was positive.  White count was 9.9, hemoglobin 9.3 g/dL and platelets 203.  PT and INR were normal.  CMP showed a glucose of 122, BUN of 34 mg/dL.  Total protein was 6.3 g/dL.  The rest of the values are within normal limits.  Review of Systems: As per HPI otherwise 10 point review of systems negative.   Past Medical History:  Diagnosis Date  . Arthritis   . Brachial neuritis   . Hypercholesteremia   . Turner syndrome    left arm, some numbness in fingers    Past Surgical History:  Procedure Laterality Date  . COLONOSCOPY WITH PROPOFOL N/A 03/05/2015   Procedure: COLONOSCOPY WITH PROPOFOL;  Surgeon: Garlan Fair, MD;  Location: [facility];  Service: Endoscopy;  Laterality: N/A;  . ESOPHAGOGASTRODUODENOSCOPY  09/08/2011   Procedure: ESOPHAGOGASTRODUODENOSCOPY (EGD);  Surgeon: Winfield Cunas., MD;  Location: [facility];  Service: Endoscopy;  Laterality: N/A;  . HERNIA REPAIR    . INGUINAL HERNIA REPAIR Right 08/30/2013   Procedure: HERNIA REPAIR INGUINAL WITH MESH  ADULT;  Surgeon: Pedro Earls, MD;  Location: [facility];  Service: General;  Laterality: Right;  . SHOULDER ARTHROSCOPY  2011   x4  one open rt, rotator cuff tear, Dr. Amedeo Plenty  . TOTAL SHOULDER ARTHROPLASTY  01/30/2011   Procedure: TOTAL SHOULDER ARTHROPLASTY;  Surgeon: Augustin Schooling;  Location: [facility];  Service: Orthopedics;  Laterality: Right;  right total shoulder atrhoplasty     reports that he has never smoked. He has never used smokeless tobacco. He reports that he does not drink alcohol or use drugs.  Allergies  Allergen Reactions  . Monosodium Glutamate Swelling    Rash, throat swells  . Tylox [Oxycodone-Acetaminophen]     Pt states he had a "weird feeling"  . Latex Rash    Family History  Problem Relation Age of Onset  . Heart disease Mother   . Heart disease Father    Prior to Admission medications   Medication Sig Start Date End Date Taking? Authorizing Provider  aspirin EC 81 MG tablet Take 81 mg by mouth at bedtime.     [provider]  atorvastatin (LIPITOR) 20 MG tablet Take  1 tablet (20 mg total) by mouth daily at 6 PM. 04/19/18   Susy Frizzle, MD  fish oil-omega-3 fatty acids 1000 MG capsule Take 2 g by mouth daily.     [provider]  gabapentin (NEURONTIN) 300 MG capsule TAKE 1 CAPSULE BY MOUTH EVERYDAY AT BEDTIME 04/12/18   [provider]  meloxicam (MOBIC) 15 MG tablet TAKE 1 TABLET BY MOUTH EVERY DAY 05/19/18   Susy Frizzle, MD  temazepam (RESTORIL) 30 MG capsule Take 1 capsule (30 mg total) by mouth at  bedtime. 05/13/18   Susy Frizzle, MD    Physical Exam: Vitals:   05/19/18 1330 05/19/18 1400 05/19/18 1415 05/19/18 1445  BP: 120/74 118/72 (!) 145/80 (!) 150/71  Pulse: 89 86 97 (!) 107  Resp: 16 16 15 19   Temp:      SpO2: 96% 98% 98% 100%  Weight:      Height:        Constitutional: NAD, calm, comfortable Eyes: PERRL, lids and conjunctivae pale. ENMT: Mucous membranes are moist. Posterior pharynx clear of any exudate or lesions. Neck: normal, supple, no masses, no thyromegaly Respiratory: clear to auscultation bilaterally, no wheezing, no crackles. Normal respiratory effort. No accessory muscle use.  Cardiovascular: Regular rate and rhythm, no murmurs / rubs / gallops. No extremity edema. 2+ pedal pulses. No carotid bruits.  Abdomen: Bowel sounds positive. Soft, no tenderness, no masses palpated. No hepatosplenomegaly.  Musculoskeletal: no clubbing / cyanosis. Good ROM, no contractures. Normal muscle tone.  Skin: no rashes, lesions, ulcers. No induration on limited dermatological examination. Neurologic: CN 2-12 grossly intact. Sensation intact, DTR normal. Strength 5/5 in all 4.  Psychiatric: Normal judgment and insight. Alert and oriented x 3. Normal mood.  Labs on Admission: I have personally reviewed following labs and imaging studies  CBC: Recent Labs  Lab 05/19/18 1225  WBC 9.9  NEUTROABS 7.0  HGB 9.3*  HCT 27.7*  MCV 93.0  PLT 952   Basic Metabolic Panel: Recent Labs  Lab 05/19/18 1225  NA 138  K 4.2  CL 105  CO2 22  GLUCOSE 122*  BUN 34*  CREATININE 1.11  CALCIUM 9.4   GFR: Estimated Creatinine Clearance: 65.8 mL/min (by C-G formula based on SCr of 1.11 mg/dL). Liver Function Tests: Recent Labs  Lab 05/19/18 1225  AST 20  ALT 19  ALKPHOS 47  BILITOT 0.6  PROT 6.3*  ALBUMIN 3.7   No results for input(s): LIPASE, AMYLASE in the last 168 hours. No results for input(s): AMMONIA in the last 168 hours. Coagulation Profile: Recent Labs  Lab  05/19/18 1225  INR 1.1   Cardiac Enzymes: No results for input(s): CKTOTAL, CKMB, CKMBINDEX, TROPONINI in the last 168 hours. BNP (last 3 results) No results for input(s): PROBNP in the last 8760 hours. HbA1C: No results for input(s): HGBA1C in the last 72 hours. CBG: No results for input(s): GLUCAP in the last 168 hours. Lipid Profile: No results for input(s): CHOL, HDL, LDLCALC, TRIG, CHOLHDL, LDLDIRECT in the last 72 hours. Thyroid Function Tests: No results for input(s): TSH, T4TOTAL, FREET4, T3FREE, THYROIDAB in the last 72 hours. Anemia Panel: No results for input(s): VITAMINB12, FOLATE, FERRITIN, TIBC, IRON, RETICCTPCT in the last 72 hours. Urine analysis: No results found for: COLORURINE, APPEARANCEUR, LABSPEC, PHURINE, GLUCOSEU, HGBUR, BILIRUBINUR, KETONESUR, PROTEINUR, UROBILINOGEN, NITRITE, LEUKOCYTESUR  Radiological Exams on Admission: No results found.  EKG: Independently reviewed.  Vent. rate 92 BPM PR interval * ms QRS duration 90 ms QT/QTc  356/441 ms P-R-T axes 45 72 67 Sinus rhythm Borderline short PR interval Abnormal R-wave progression, early transition  Assessment/Plan Principal Problem:   Gastrointestinal hemorrhage with melena Observation/telemetry. Continue Protonix infusion. Hold aspirin and meloxicam. Monitor H&H. Transfuse as needed. GI will evaluate for endoscopic studies.  Active Problems:   Acute blood loss anemia Monitor H&H. Transfuse as needed.    Hypercholesteremia Hold atorvastatin for now.    Insomnia Continue temazepam as needed.   DVT prophylaxis: SCDs. Code Status: Full code. Family Communication: Disposition Plan: Observation for H&H monitoring, Protonix infusion and GI evaluation. Consults called: Gastroenterology was consulted and evaluated. Admission status: Observation/telemetry.   Reubin Milan MD [facility]  05/19/2018, 2:52 PM   This document was prepared using Dragon voice recognition  software and may contain some unintended transcription errors.

## 2018-05-19 NOTE — ED Notes (Signed)
ED TO INPATIENT HANDOFF REPORT  ED Nurse Name and Phone #: Andrae Claunch [phone]  S Name/Age/Gender Richard Arnold. 68 y.o. male Room/Bed: 017C/017C  Code Status   Code Status: Prior  Home/SNF/Other Home Patient oriented to: self, place, time and situation Is this baseline? Yes   Triage Complete: Triage complete  Chief Complaint Hemoglobin 8.6  Triage Note Pt arrives POV from Dr. Dennard Schaumann office where he was found to have HGB of 8.4 that had fallen from 14. Pt state black tarry stools noted since Monday.   Allergies Allergies  Allergen Reactions  . Monosodium Glutamate Swelling    Rash, throat swells  . Tylox [Oxycodone-Acetaminophen]     Pt states he had a "weird feeling"  . Latex Rash    Level of Care/Admitting Diagnosis ED Disposition    ED Disposition Condition Comment   Admit  Hospital Area: [facility] [[id number]]  Level of Care: Telemetry Medical [104]  I expect the patient will be discharged within 24 hours: No (not a candidate for 5C-Observation unit)  Diagnosis: Gastrointestinal hemorrhage with melena [2979892]  Admitting Physician: Reubin Milan [[id number]]  Attending Physician: Reubin Milan [[id number]]  PT Class (Do Not Modify): Observation [104]  PT Acc Code (Do Not Modify): Observation [10022]       B Medical/Surgery History Past Medical History:  Diagnosis Date  . Arthritis   . Brachial neuritis   . Hypercholesteremia   . Turner syndrome    left arm, some numbness in fingers   Past Surgical History:  Procedure Laterality Date  . COLONOSCOPY WITH PROPOFOL N/A 03/05/2015   Procedure: COLONOSCOPY WITH PROPOFOL;  Surgeon: Garlan Fair, MD;  [facility];  Service: Endoscopy;  Laterality: N/A;  . ESOPHAGOGASTRODUODENOSCOPY  09/08/2011   Procedure: ESOPHAGOGASTRODUODENOSCOPY (EGD);  Surgeon: Winfield Cunas., MD;  Location: [facility];  Service: Endoscopy;  Laterality: N/A;  . HERNIA REPAIR    . INGUINAL  HERNIA REPAIR Right 08/30/2013   Procedure: HERNIA REPAIR INGUINAL WITH MESH  ADULT;  Surgeon: Pedro Earls, MD;  Location: [facility];  Service: General;  Laterality: Right;  . SHOULDER ARTHROSCOPY  2011   x4  one open rt, rotator cuff tear, Dr. Amedeo Plenty  . TOTAL SHOULDER ARTHROPLASTY  01/30/2011   Procedure: TOTAL SHOULDER ARTHROPLASTY;  Surgeon: Augustin Schooling;  Location: [facility];  Service: Orthopedics;  Laterality: Right;  right total shoulder atrhoplasty     A IV Location/Drains/Wounds Patient Lines/Drains/Airways Status   Active Line/Drains/Airways    Name:   Placement date:   Placement time:   Site:   Days:   Peripheral IV 05/19/18 Right Antecubital   05/19/18    1220    Antecubital   less than 1   Incision (Closed) 08/30/13 Groin Right   08/30/13    1304     1723   Wound 09/05/11 Abrasion(s) Head Right;Anterior red   09/05/11    1744    Head   2448          Intake/Output Last 24 hours No intake or output data in the 24 hours ending 05/19/18 1447  Labs/Imaging Results for orders placed or performed during the hospital encounter of 05/19/18 (from the past 48 hour(s))  Comprehensive metabolic panel     Status: Abnormal   Collection Time: 05/19/18 12:25 PM  Result Value Ref Range   Sodium 138 135 - 145 mmol/L   Potassium 4.2 3.5 - 5.1 mmol/L   Chloride 105 98 -  111 mmol/L   CO2 22 22 - 32 mmol/L   Glucose, Bld 122 (H) 70 - 99 mg/dL   BUN 34 (H) 8 - 23 mg/dL   Creatinine, Ser 1.11 0.61 - 1.24 mg/dL   Calcium 9.4 8.9 - 10.3 mg/dL   Total Protein 6.3 (L) 6.5 - 8.1 g/dL   Albumin 3.7 3.5 - 5.0 g/dL   AST 20 15 - 41 U/L   ALT 19 0 - 44 U/L   Alkaline Phosphatase 47 38 - 126 U/L   Total Bilirubin 0.6 0.3 - 1.2 mg/dL   GFR calc non Af Amer >60 >60 mL/min   GFR calc Af Amer >60 >60 mL/min   Anion gap 11 5 - 15    Comment: Performed at [facility] [address], [place], [place] [address]  CBC WITH DIFFERENTIAL     Status: Abnormal   Collection Time: 05/19/18 12:25 PM   Result Value Ref Range   WBC 9.9 4.0 - 10.5 K/uL   RBC 2.98 (L) 4.22 - 5.81 MIL/uL   Hemoglobin 9.3 (L) 13.0 - 17.0 g/dL   HCT 27.7 (L) 39.0 - 52.0 %   MCV 93.0 80.0 - 100.0 fL   MCH 31.2 26.0 - 34.0 pg   MCHC 33.6 30.0 - 36.0 g/dL   RDW 13.3 11.5 - 15.5 %   Platelets 203 150 - 400 K/uL   nRBC 0.0 0.0 - 0.2 %   Neutrophils Relative % 71 %   Neutro Abs 7.0 1.7 - 7.7 K/uL   Lymphocytes Relative 22 %   Lymphs Abs 2.2 0.7 - 4.0 K/uL   Monocytes Relative 7 %   Monocytes Absolute 0.7 0.1 - 1.0 K/uL   Eosinophils Relative 0 %   Eosinophils Absolute 0.0 0.0 - 0.5 K/uL   Basophils Relative 0 %   Basophils Absolute 0.0 0.0 - 0.1 K/uL   Immature Granulocytes 0 %   Abs Immature Granulocytes 0.04 0.00 - 0.07 K/uL    Comment: Performed at [facility] [address], [place], [place] [address]  Protime-INR     Status: None   Collection Time: 05/19/18 12:25 PM  Result Value Ref Range   Prothrombin Time 14.0 11.4 - 15.2 seconds   INR 1.1 0.8 - 1.2    Comment: (NOTE) INR goal varies based on device and disease states. Performed at [facility], [place] [address] [place], [place] [address]   Type and screen [place]     Status: None   Collection Time: 05/19/18 12:25 PM  Result Value Ref Range   ABO/RH(D) O POS    Antibody Screen NEG    Sample Expiration      05/22/2018 Performed at [facility], [place] [address] [place],  [address]   POC occult blood, ED Provider will collect     Status: Abnormal   Collection Time: 05/19/18 12:27 PM  Result Value Ref Range   Fecal Occult Bld POSITIVE (A) NEGATIVE   No results found.  Pending Labs Unresulted Labs (From admission, onward)   None      Vitals/Pain Today's Vitals   05/19/18 1330 05/19/18 1400 05/19/18 1415 05/19/18 1445  BP: 120/74 118/72 (!) 145/80 (!) 150/71  Pulse: 89 86 97 (!) 107  Resp: 16 16 15 19   Temp:      SpO2: 96% 98% 98% 100%  Weight:      Height:      PainSc:  Isolation Precautions No active isolations  Medications Medications  pantoprazole (PROTONIX) 80 mg in sodium chloride 0.9 % 250 mL (0.32 mg/mL) infusion (8 mg/hr Intravenous New Bag/Given 05/19/18 1321)  0.9 %  sodium chloride infusion (has no administration in time range)  pantoprazole (PROTONIX) 80 mg in sodium chloride 0.9 % 100 mL IVPB (80 mg Intravenous New Bag/Given 05/19/18 1323)    Mobility walks Low fall risk   Focused Assessments Cardiac Assessment Handoff:  Cardiac Rhythm: Normal sinus rhythm, Sinus tachycardia No results found for: CKTOTAL, CKMB, CKMBINDEX, TROPONINI No results found for: DDIMER Does the Patient currently have chest pain? No      R Recommendations: See Admitting Provider Note  Report given to:   Additional Notes:

## 2018-05-19 NOTE — Progress Notes (Signed)
Subjective:    Patient ID: EDDER Arnold, male    DOB: 1950-07-20, 68 y.o.   MRN: [id number]  HPI Patient was recently seen for his physical in mid March.  At that time his hemoglobin was normal at 14.6.  2 days ago he developed the gradual onset of epigastric abdominal discomfort.  The pain is constant.  It is 4 out of 10 in intensity.  There is no exacerbating or alleviating factors.  Food does not make the pain worse.  However he has had 3-4 dark black stools.  Patient discontinued his aspirin 2 days ago.  However he has been taking meloxicam occasionally for joint pain.  He denies any fever or chills or vomiting.  He has had some left-sided lower back pain however associated with diarrhea.  The diarrhea is melanotic in nature.  Fingerstick hemoglobin was obtained today and hemoglobin was down to 8!Marland Kitchen  And week and tired.  His blood pressure is relatively low where in the past it has been elevated.  He denies any chest pain or shortness of breath however he is mildly tachycardic with heart rate my exam. Past Medical History:  Diagnosis Date  . Arthritis   . Brachial neuritis   . Hypercholesteremia   . Turner syndrome    left arm, some numbness in fingers   Past Surgical History:  Procedure Laterality Date  . COLONOSCOPY WITH PROPOFOL N/A 03/05/2015   Procedure: COLONOSCOPY WITH PROPOFOL;  Surgeon: Garlan Fair, MD;  [facility];  Service: Endoscopy;  Laterality: N/A;  . ESOPHAGOGASTRODUODENOSCOPY  09/08/2011   Procedure: ESOPHAGOGASTRODUODENOSCOPY (EGD);  Surgeon: Winfield Cunas., MD;  Location: [facility];  Service: Endoscopy;  Laterality: N/A;  . HERNIA REPAIR    . INGUINAL HERNIA REPAIR Right 08/30/2013   Procedure: HERNIA REPAIR INGUINAL WITH MESH  ADULT;  Surgeon: Pedro Earls, MD;  Location: [facility];  Service: General;  Laterality: Right;  . SHOULDER ARTHROSCOPY  2011   x4  one open rt, rotator cuff tear, Dr. Amedeo Plenty  . TOTAL SHOULDER ARTHROPLASTY  01/30/2011   Procedure: TOTAL SHOULDER ARTHROPLASTY;  Surgeon: Augustin Schooling;  Location: [facility];  Service: Orthopedics;  Laterality: Right;  right total shoulder atrhoplasty   Current Outpatient Medications on File Prior to Visit  Medication Sig Dispense Refill  . aspirin EC 81 MG tablet Take 81 mg by mouth at bedtime.     Marland Kitchen atorvastatin (LIPITOR) 20 MG tablet Take 1 tablet (20 mg total) by mouth daily at 6 PM. 90 tablet 3  . fish oil-omega-3 fatty acids 1000 MG capsule Take 2 g by mouth daily.     Marland Kitchen gabapentin (NEURONTIN) 300 MG capsule TAKE 1 CAPSULE BY MOUTH EVERYDAY AT BEDTIME    . meloxicam (MOBIC) 15 MG tablet TAKE 1 TABLET BY MOUTH EVERY DAY 30 tablet 0  . temazepam (RESTORIL) 30 MG capsule Take 1 capsule (30 mg total) by mouth at bedtime. 90 capsule 1   No current facility-administered medications on file prior to visit.    Allergies  Allergen Reactions  . Monosodium Glutamate Swelling    Rash, throat swells  . Tylox [Oxycodone-Acetaminophen]     Pt states he had a "weird feeling"  . Latex Rash   Social History   Socioeconomic History  . Marital status: Married    Spouse name: Not on file  . Number of children: Not on file  . Years of education: Not on file  . Highest education level: Not  on file  Occupational History  . Not on file  Social Needs  . Financial resource strain: Not on file  . Food insecurity:    Worry: Not on file    Inability: Not on file  . Transportation needs:    Medical: Not on file    Non-medical: Not on file  Tobacco Use  . Smoking status: Never Smoker  . Smokeless tobacco: Never Used  Substance and Sexual Activity  . Alcohol use: No  . Drug use: No  . Sexual activity: Not on file  Lifestyle  . Physical activity:    Days per week: Not on file    Minutes per session: Not on file  . Stress: Not on file  Relationships  . Social connections:    Talks on phone: Not on file    Gets together: Not on file    Attends religious service: Not on file     Active member of club or organization: Not on file    Attends meetings of clubs or organizations: Not on file    Relationship status: Not on file  . Intimate partner violence:    Fear of current or ex partner: Not on file    Emotionally abused: Not on file    Physically abused: Not on file    Forced sexual activity: Not on file  Other Topics Concern  . Not on file  Social History Narrative  . Not on file      Review of Systems  All other systems reviewed and are negative.      Objective:   Physical Exam Constitutional:      General: He is not in acute distress.    Appearance: He is not ill-appearing, toxic-appearing or diaphoretic.  Cardiovascular:     Rate and Rhythm: Regular rhythm. Tachycardia present.     Heart sounds: Normal heart sounds. No murmur. No friction rub. No gallop.   Pulmonary:     Effort: Pulmonary effort is normal. No respiratory distress.     Breath sounds: Normal breath sounds. No stridor. No wheezing or rhonchi.  Abdominal:     General: Abdomen is flat. Bowel sounds are normal. There is no distension.     Palpations: Abdomen is soft.     Tenderness: There is no abdominal tenderness. There is no guarding.  Neurological:     Mental Status: He is alert.           Assessment & Plan:  Dark stools - Plan: Hemoglobin, fingerstick  Gastrointestinal hemorrhage with melena  Patient has melena x2 days.  He has epigastric abdominal pain x2 days with no exacerbating or alleviating factors.  He has had several episodes of melanotic stool and diarrhea.  Hemoglobin has dropped from 14.6-8.  All this is concerning for an upper GI bleed with melena.  Given his tachycardia , the rapid drop in his hemoglobin, and his fatigue, patient needs hospital admission for monitoring and urgent GI consult with EGD.  Patient may require transfusion if hemoglobin drops further.  Patient is stable to be transported by personal vehicle.  Emergency room is notified

## 2018-05-19 NOTE — ED Provider Notes (Addendum)
[facility] EMERGENCY DEPARTMENT Provider Note   CSN: [id number] Arrival date & time: 05/19/18  1206    History   Chief Complaint Chief Complaint  Patient presents with  . abnormal labs    HPI Richard Arnold is a 68 y.o. male.  He has a prior history of an upper GI bleed secondary to NSAID use.  He said he has had a few days of some vague mild abdominal discomfort.  2 days ago he started with a dark tarry bowel movement and yesterday had some dark diarrhea.  He again had a dark bowel movement today.  He went to his primary care doctor's office where they found his hemoglobin to be low.  He denies any chest pain but he has felt dizzy lightheaded.  No urinary symptoms no fever no cough.  He is using meloxicam but otherwise no NSAIDs.  He takes a baby aspirin.  Otherwise not on any anticoagulation.     The history is provided by the patient.  Rectal Bleeding  Quality:  Black and tarry Amount:  Moderate Duration:  2 days Timing:  Intermittent Chronicity:  Recurrent Context: defecation and diarrhea   Context: not rectal injury and not rectal pain   Similar prior episodes: yes   Relieved by:  None tried Worsened by:  Nothing Ineffective treatments:  None tried Associated symptoms: abdominal pain, dizziness and light-headedness   Associated symptoms: no epistaxis, no fever, no hematemesis, no loss of consciousness, no recent illness and no vomiting   Risk factors: no anticoagulant use     Past Medical History:  Diagnosis Date  . Arthritis   . Brachial neuritis   . Hypercholesteremia   . 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 S/P right inguinal hernia repair July 2015 08/30/2013  . [place]-Turner syndrome-left upper extremity 08/22/2013  . Acute blood loss anemia 09/05/2011  . Osteoarthrosis, unspecified whether generalized or localized, shoulder region 01/30/2011    Past Surgical  History:  Procedure Laterality Date  . COLONOSCOPY WITH PROPOFOL N/A 03/05/2015   Procedure: COLONOSCOPY WITH PROPOFOL;  Surgeon: Garlan Fair, MD;  Location: [facility];  Service: Endoscopy;  Laterality: N/A;  . ESOPHAGOGASTRODUODENOSCOPY  09/08/2011   Procedure: ESOPHAGOGASTRODUODENOSCOPY (EGD);  Surgeon: Winfield Cunas., MD;  Location: [facility];  Service: Endoscopy;  Laterality: N/A;  . HERNIA REPAIR    . INGUINAL HERNIA REPAIR Right 08/30/2013   Procedure: HERNIA REPAIR INGUINAL WITH MESH  ADULT;  Surgeon: Pedro Earls, MD;  Location: [facility];  Service: General;  Laterality: Right;  . SHOULDER ARTHROSCOPY  2011   x4  one open rt, rotator cuff tear, Dr. Amedeo Plenty  . TOTAL SHOULDER ARTHROPLASTY  01/30/2011   Procedure: TOTAL SHOULDER ARTHROPLASTY;  Surgeon: Augustin Schooling;  Location: [facility];  Service: Orthopedics;  Laterality: Right;  right total shoulder atrhoplasty        Home Medications    Prior to Admission medications   Medication Sig Start Date End Date Taking? Authorizing Provider  aspirin EC 81 MG tablet Take 81 mg by mouth at bedtime.     [provider]  atorvastatin (LIPITOR) 20 MG tablet Take 1 tablet (20 mg total) by mouth daily at 6 PM. 04/19/18   Susy Frizzle, MD  fish oil-omega-3 fatty acids 1000 MG capsule Take 2 g by mouth daily.     [provider]  gabapentin (NEURONTIN) 300 MG capsule TAKE 1 CAPSULE BY MOUTH EVERYDAY AT BEDTIME 04/12/18   [provider]  meloxicam (MOBIC) 15 MG tablet TAKE 1 TABLET BY MOUTH EVERY DAY 05/19/18   Susy Frizzle, MD  temazepam (RESTORIL) 30 MG capsule Take 1 capsule (30 mg total) by mouth at bedtime. 05/13/18   Susy Frizzle, MD    Family History Family History  Problem Relation Age of Onset  . Heart disease Mother   . Heart disease Father     Social History Social History   Tobacco Use  . Smoking status: Never Smoker  . Smokeless tobacco: Never Used  Substance Use Topics  .  Alcohol use: No  . Drug use: No     Allergies   Monosodium glutamate; Tylox [oxycodone-acetaminophen]; and Latex   Review of Systems Review of Systems  Constitutional: Negative for fever.  HENT: Negative for nosebleeds and sore throat.   Eyes: Negative for visual disturbance.  Respiratory: Negative for shortness of breath.   Cardiovascular: Negative for chest pain.  Gastrointestinal: Positive for abdominal pain and hematochezia. Negative for hematemesis, nausea, rectal pain and vomiting.  Genitourinary: Negative for dysuria.  Musculoskeletal: Negative for neck pain.  Skin: Negative for rash.  Neurological: Positive for dizziness and light-headedness. Negative for loss of consciousness.     Physical Exam Updated Vital Signs BP (!) 160/83 (BP Location: Left Arm)   Pulse 99   Temp 97.8 F (36.6 C)   Resp 16   Ht 5\' 8"  (1.727 m)   Wt 82.6 kg   SpO2 100%   BMI 27.67 kg/m   Physical Exam Vitals signs and nursing note reviewed.  Constitutional:      Appearance: He is well-developed.  HENT:     Head: Normocephalic and atraumatic.  Eyes:     Conjunctiva/sclera: Conjunctivae normal.  Neck:     Musculoskeletal: Neck supple.  Cardiovascular:     Rate and Rhythm: Normal rate and regular rhythm.     Heart sounds: No murmur.  Pulmonary:     Effort: Pulmonary effort is normal. No respiratory distress.     Breath sounds: Normal breath sounds.  Abdominal:     Palpations: Abdomen is soft.     Tenderness: There is no abdominal tenderness.  Genitourinary:    Rectum: Normal.  Skin:    General: Skin is warm and dry.     Capillary Refill: Capillary refill takes less than 2 seconds.  Neurological:     General: No focal deficit present.     Mental Status: He is alert and oriented to person, place, and time.     Sensory: No sensory deficit.     Motor: No weakness.      ED Treatments / Results  Labs (all labs ordered are listed, but only abnormal results are displayed)  Labs Reviewed  COMPREHENSIVE METABOLIC PANEL - Abnormal; Notable for the following components:      Result Value   Glucose, Bld 122 (*)    BUN 34 (*)    Total Protein 6.3 (*)    All other components within normal limits  CBC WITH DIFFERENTIAL/PLATELET - Abnormal; Notable for the following components:   RBC 2.98 (*)    Hemoglobin 9.3 (*)    HCT 27.7 (*)    All other components within normal limits  HEMOGLOBIN - Abnormal; Notable for the following components:   Hemoglobin 7.8 (*)    All other components within normal limits  HEMATOCRIT - Abnormal; Notable for  the following components:   HCT 23.0 (*)    All other components within normal limits  POC OCCULT BLOOD, ED - Abnormal; Notable for the following components:   Fecal Occult Bld POSITIVE (*)    All other components within normal limits  PROTIME-INR  HEMOGLOBIN  HEMATOCRIT  HIV ANTIBODY (ROUTINE TESTING W REFLEX)  CBC WITH DIFFERENTIAL/PLATELET  TYPE AND SCREEN    EKG EKG Interpretation  Date/Time:  Thursday May 19 2018 12:19:16 EDT Ventricular Rate:  92 PR Interval:    QRS Duration: 90 QT Interval:  356 QTC Calculation: 441 R Axis:   72 Text Interpretation:  Sinus rhythm Borderline short PR interval Abnormal R-wave progression, early transition similar to prior 1/03 Confirmed by Aletta Edouard [phone]) on 05/19/2018 12:28:26 PM   Radiology No results found.  Procedures .Critical Care Performed by: Hayden Rasmussen, MD Authorized by: Hayden Rasmussen, MD   Critical care provider statement:    Critical care time (minutes):  45   Critical care time was exclusive of:  Separately billable procedures and treating other patients   Critical care was necessary to treat or prevent imminent or life-threatening deterioration of the following conditions:  Circulatory failure   Critical care was time spent personally by me on the following activities:  Discussions with consultants, evaluation of patient's response to  treatment, examination of patient, ordering and performing treatments and interventions, ordering and review of laboratory studies, ordering and review of radiographic studies, pulse oximetry, re-evaluation of patient's condition, obtaining history from patient or surrogate, review of old charts and development of treatment plan with patient or surrogate   I assumed direction of critical care for this patient from another provider in my specialty: no     (including critical care time)  Medications Ordered in ED Medications  pantoprazole (PROTONIX) 80 mg in sodium chloride 0.9 % 250 mL (0.32 mg/mL) infusion (8 mg/hr Intravenous New Bag/Given 05/19/18 1321)  0.9 %  sodium chloride infusion ( Intravenous New Bag/Given 05/19/18 1749)  ondansetron (ZOFRAN) tablet 4 mg (has no administration in time range)    Or  ondansetron (ZOFRAN) injection 4 mg (has no administration in time range)  temazepam (RESTORIL) capsule 30 mg (has no administration in time range)  pantoprazole (PROTONIX) 80 mg in sodium chloride 0.9 % 100 mL IVPB (0 mg Intravenous Stopping Infusion hung by another clincian 05/19/18 1548)     Initial Impression / Assessment and Plan / ED Course  I have reviewed the triage vital signs and the nursing notes.  Pertinent labs & imaging results that were available during my care of the patient were reviewed by me and considered in my medical decision making (see chart for details).  Clinical Course as of May 19 1835  Thu May 19, 2018  1413 Frontal diagnosis includes upper GI bleed, lower GI bleed, gastritis, peptic ulcer disease, perforation.  Discussed with Dr. Oletta Lamas from [facility] who will evaluate the patient in the ED.   [MB]  8469 Have updated the patient on the plan and he is agreeable, all questions were answered.   [MB]  6295 Discussed with Dr. Olevia Bowens from [facility] who will admit the patient to his service.   [MB]    Clinical Course User Index [MB] Hayden Rasmussen, MD        Final Clinical Impressions(s) / ED Diagnoses   Final diagnoses:  Upper GI bleed    ED Discharge Orders    None  Hayden Rasmussen, MD 05/19/18 1837    Hayden Rasmussen, MD 05/30/18 [phone]

## 2018-05-19 NOTE — Telephone Encounter (Signed)
Requested Prescriptions   Pending Prescriptions Disp Refills  . meloxicam (MOBIC) 15 MG tablet [Pharmacy Med Name: MELOXICAM 15 MG TABLET] 30 tablet 0    Sig: TAKE 1 TABLET BY MOUTH EVERY DAY   Last OV 04/26/2018  Last Rx written 04/26/2018

## 2018-05-20 ENCOUNTER — Encounter (HOSPITAL_COMMUNITY): Payer: Self-pay | Admitting: Internal Medicine

## 2018-05-20 DIAGNOSIS — Z79899 Other long term (current) drug therapy: Secondary | ICD-10-CM | POA: Diagnosis not present

## 2018-05-20 DIAGNOSIS — D62 Acute posthemorrhagic anemia: Secondary | ICD-10-CM | POA: Diagnosis not present

## 2018-05-20 DIAGNOSIS — E7800 Pure hypercholesterolemia, unspecified: Secondary | ICD-10-CM

## 2018-05-20 DIAGNOSIS — Z791 Long term (current) use of non-steroidal anti-inflammatories (NSAID): Secondary | ICD-10-CM | POA: Diagnosis not present

## 2018-05-20 DIAGNOSIS — K254 Chronic or unspecified gastric ulcer with hemorrhage: Secondary | ICD-10-CM | POA: Diagnosis not present

## 2018-05-20 DIAGNOSIS — K921 Melena: Secondary | ICD-10-CM | POA: Diagnosis not present

## 2018-05-20 DIAGNOSIS — Z7982 Long term (current) use of aspirin: Secondary | ICD-10-CM | POA: Diagnosis not present

## 2018-05-20 DIAGNOSIS — G4700 Insomnia, unspecified: Secondary | ICD-10-CM

## 2018-05-20 DIAGNOSIS — Z96611 Presence of right artificial shoulder joint: Secondary | ICD-10-CM | POA: Diagnosis not present

## 2018-05-20 DIAGNOSIS — E785 Hyperlipidemia, unspecified: Secondary | ICD-10-CM | POA: Diagnosis not present

## 2018-05-20 DIAGNOSIS — Z8711 Personal history of peptic ulcer disease: Secondary | ICD-10-CM | POA: Diagnosis not present

## 2018-05-20 DIAGNOSIS — K922 Gastrointestinal hemorrhage, unspecified: Secondary | ICD-10-CM | POA: Diagnosis not present

## 2018-05-20 LAB — CBC WITH DIFFERENTIAL/PLATELET
Abs Immature Granulocytes: 0.02 10*3/uL (ref 0.00–0.07)
Basophils Absolute: 0 10*3/uL (ref 0.0–0.1)
Basophils Relative: 0 %
Eosinophils Absolute: 0.2 10*3/uL (ref 0.0–0.5)
Eosinophils Relative: 3 %
HCT: 21 % — ABNORMAL LOW (ref 39.0–52.0)
Hemoglobin: 7.1 g/dL — ABNORMAL LOW (ref 13.0–17.0)
Immature Granulocytes: 0 %
Lymphocytes Relative: 41 %
Lymphs Abs: 3 10*3/uL (ref 0.7–4.0)
MCH: 30.5 pg (ref 26.0–34.0)
MCHC: 33.8 g/dL (ref 30.0–36.0)
MCV: 90.1 fL (ref 80.0–100.0)
Monocytes Absolute: 0.6 10*3/uL (ref 0.1–1.0)
Monocytes Relative: 8 %
Neutro Abs: 3.4 10*3/uL (ref 1.7–7.7)
Neutrophils Relative %: 48 %
Platelets: 163 10*3/uL (ref 150–400)
RBC: 2.33 MIL/uL — ABNORMAL LOW (ref 4.22–5.81)
RDW: 13.6 % (ref 11.5–15.5)
WBC: 7.2 10*3/uL (ref 4.0–10.5)
nRBC: 0 % (ref 0.0–0.2)

## 2018-05-20 LAB — HIV ANTIBODY (ROUTINE TESTING W REFLEX): HIV Screen 4th Generation wRfx: NONREACTIVE

## 2018-05-20 LAB — HEMOGLOBIN AND HEMATOCRIT, BLOOD
HCT: 25.2 % — ABNORMAL LOW (ref 39.0–52.0)
Hemoglobin: 8.5 g/dL — ABNORMAL LOW (ref 13.0–17.0)

## 2018-05-20 LAB — PREPARE RBC (CROSSMATCH)

## 2018-05-20 MED ORDER — MAGNESIUM SULFATE 2 GM/50ML IV SOLN
2.0000 g | Freq: Once | INTRAVENOUS | Status: AC
  Administered 2018-05-20: 2 g via INTRAVENOUS

## 2018-05-20 MED ORDER — SODIUM CHLORIDE 0.9% IV SOLUTION
Freq: Once | INTRAVENOUS | Status: AC
  Administered 2018-05-20: 10:00:00 via INTRAVENOUS

## 2018-05-20 MED ORDER — SODIUM CHLORIDE 0.9 % IV SOLN: INTRAVENOUS | Status: AC

## 2018-05-20 NOTE — Care Management Obs Status (Signed)
[facility] NOTIFICATION   Patient Details  Name: Richard Arnold. MRN: [id number] Date of Birth: 27-Sep-1950   Medicare Observation Status Notification Given:   yes    Sharin Mons, RN 05/20/2018, 8:41 AM

## 2018-05-20 NOTE — Progress Notes (Signed)
[facility] PROGRESS NOTE  Richard Arnold. JME:[id number] DOB: [birth year] DOA: 05/19/2018 PCP: Susy Frizzle, MD  Assessment/Plan: Gastrointestinal hemorrhage with melena. Hx gastric ulcer biopsies positive for H. Pylori. Taking NSAID lately for arthritis. No BM's since admission. Evaluated by gi who opine likely upper gi bleed with nsaid related ulcerations.  -Continue Protonix infusion. -continue to Hold aspirin and meloxicam. -Monitor H&H. -Transfuse as needed. -per gi will do egd only on emergent basis given covid-19 risk. Monitor for 24 hours. If no further bleeding will discharge and follow up EGD 6-8 weeks  Active Problems:   Acute blood loss anemia. Related to #1. Hg drifting down. Hg 7.1 this am from 9.3 14 hours ago. Down from 7.8 8 hours prior. Some may be dilutional. BP also drifting down slightly.  -will transfuse 1 unit PRBC's -Monitor H&H.    Hypercholesteremia Hold atorvastatin for now.    Insomnia Continue temazepam as needed  Code Status: full Family Communication: non present Disposition Plan: home hopefully tomorrow   Consultants:  Dr. 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 No pain/nausea/vomiting/diarrhea. Did complain lightheadedness.   Objective: Vitals:   05/19/18 2101 05/20/18 0439  BP: 124/68 (!) 99/57  Pulse: 81 73  Resp:    Temp: 99.2 F (37.3 C) 98.5 F (36.9 C)  SpO2: 97% 96%   No intake or output data in the 24 hours ending 05/20/18 0839 Filed Weights   05/19/18 1214 05/19/18 1542  Weight: 82.6 kg 82.8 kg    Exam:   General:  Sitting up in bed watching tv no acute distress  Cardiovascular: rrr no mgr no LE  edema  Respiratory: normal effort BS clear bilaterally no wheeze or rhochi  Abdomen: soft +BS no guarding or rebounding non-tender to palpation  Musculoskeletal: joints without swelling/erythema   Data Reviewed: Basic Metabolic Panel: Recent Labs  Lab 05/19/18 1225  NA 138  K 4.2  CL 105  CO2 22  GLUCOSE 122*  BUN 34*  CREATININE 1.11  CALCIUM 9.4   Liver Function Tests: Recent Labs  Lab 05/19/18 1225  AST 20  ALT 19  ALKPHOS 47  BILITOT 0.6  PROT 6.3*  ALBUMIN 3.7   No results for input(s): LIPASE, AMYLASE in the last 168 hours. No results for input(s): AMMONIA in the last 168 hours. CBC: Recent Labs  Lab 05/19/18 1225 05/19/18 1712 05/19/18 2326 05/20/18 0233  WBC 9.9  --   --  7.2  NEUTROABS 7.0  --   --  3.4  HGB 9.3* 7.8* 7.3* 7.1*  HCT 27.7* 23.0* 21.3* 21.0*  MCV 93.0  --   --  90.1  PLT 203  --   --  163   Cardiac Enzymes: No results for input(s): CKTOTAL, CKMB, CKMBINDEX, TROPONINI in the last 168 hours. BNP (last 3 results) No results for input(s): BNP in the last 8760 hours.  ProBNP (last 3 results) No results for input(s): PROBNP in the last 8760 hours.  CBG: No results for input(s): GLUCAP in the last 168 hours.  No results found for this or any previous visit (from the past 240 hour(s)).   Studies: No results found.  Scheduled Meds: . sodium chloride  Intravenous Once  . temazepam  30 mg Oral QHS   Continuous Infusions: . sodium chloride    . magnesium sulfate 1 - 4 g bolus IVPB    . pantoprozole (PROTONIX) infusion 8 mg/hr (05/20/18 0038)    Principal Problem:   Gastrointestinal hemorrhage with melena Active Problems:   Acute blood loss anemia   Hypomagnesemia   Hypercholesteremia   Insomnia    Time spent: 45 minutes    [place] NP  [facility]  If 7PM-7AM, please contact night-coverage at [email or web address], password [facility] 05/20/2018, 8:39 AM  LOS: 0 days

## 2018-05-20 NOTE — Progress Notes (Signed)
Subjective: The patient was seen and examined at bedside. Reports that he had last bowel movement yesterday morning, no further black stools in the last 24 hours. Denies abdominal pain, nausea or vomiting. Reoorts that he is passing flatus.  Objective: Vital signs in last 24 hours: Temp:  [97.8 F (36.6 C)-99.2 F (37.3 C)] 98.4 F (36.9 C) (04/10 0850) Pulse Rate:  [73-107] 89 (04/10 0843) Resp:  [12-19] 14 (04/10 0843) BP: (99-160)/(57-90) 130/65 (04/10 0843) SpO2:  [95 %-100 %] 96 % (04/10 0439) Weight:  [82.6 kg-82.8 kg] 82.8 kg (04/09 1542) Weight change:  Last BM Date: 05/19/18  PE: Mild pallor GENERAL: Appears comfortable, not in respiratory distress  ABDOMEN: Soft, nontender, nondistended, normal active bowel sounds EXTREMITIES: No deformity  Lab Results: Results for orders placed or performed during the hospital encounter of 05/19/18 (from the past 48 hour(s))  Comprehensive metabolic panel     Status: Abnormal   Collection Time: 05/19/18 12:25 PM  Result Value Ref Range   Sodium 138 135 - 145 mmol/L   Potassium 4.2 3.5 - 5.1 mmol/L   Chloride 105 98 - 111 mmol/L   CO2 22 22 - 32 mmol/L   Glucose, Bld 122 (H) 70 - 99 mg/dL   BUN 34 (H) 8 - 23 mg/dL   Creatinine, Ser 1.11 0.61 - 1.24 mg/dL   Calcium 9.4 8.9 - 10.3 mg/dL   Total Protein 6.3 (L) 6.5 - 8.1 g/dL   Albumin 3.7 3.5 - 5.0 g/dL   AST 20 15 - 41 U/L   ALT 19 0 - 44 U/L   Alkaline Phosphatase 47 38 - 126 U/L   Total Bilirubin 0.6 0.3 - 1.2 mg/dL   GFR calc non Af Amer >60 >60 mL/min   GFR calc Af Amer >60 >60 mL/min   Anion gap 11 5 - 15    Comment: Performed at [facility], [address], [place], [place] [address]  CBC WITH DIFFERENTIAL     Status: Abnormal   Collection Time: 05/19/18 12:25 PM  Result Value Ref Range   WBC 9.9 4.0 - 10.5 K/uL   RBC 2.98 (L) 4.22 - 5.81 MIL/uL   Hemoglobin 9.3 (L) 13.0 - 17.0 g/dL   HCT 27.7 (L) 39.0 - 52.0 %   MCV 93.0 80.0 - 100.0 fL   MCH 31.2 26.0 -  34.0 pg   MCHC 33.6 30.0 - 36.0 g/dL   RDW 13.3 11.5 - 15.5 %   Platelets 203 150 - 400 K/uL   nRBC 0.0 0.0 - 0.2 %   Neutrophils Relative % 71 %   Neutro Abs 7.0 1.7 - 7.7 K/uL   Lymphocytes Relative 22 %   Lymphs Abs 2.2 0.7 - 4.0 K/uL   Monocytes Relative 7 %   Monocytes Absolute 0.7 0.1 - 1.0 K/uL   Eosinophils Relative 0 %   Eosinophils Absolute 0.0 0.0 - 0.5 K/uL   Basophils Relative 0 %   Basophils Absolute 0.0 0.0 - 0.1 K/uL   Immature Granulocytes 0 %   Abs Immature Granulocytes 0.04 0.00 - 0.07 K/uL    Comment: Performed at [facility] [address], [place], [place] [address]  Protime-INR     Status: None   Collection Time: 05/19/18 12:25 PM  Result Value Ref Range   Prothrombin Time 14.0 11.4 - 15.2 seconds   INR 1.1 0.8 - 1.2    Comment: (NOTE) INR goal varies based on device and disease states. Performed  at [facility], [place] [address], [place], [place] [address]   Type and screen [place]     Status: None (Preliminary result)   Collection Time: 05/19/18 12:25 PM  Result Value Ref Range   ABO/RH(D) O POS    Antibody Screen NEG    Sample Expiration 05/22/2018    Unit Number [id number]    Blood Component Type RED CELLS,LR    Unit division 00    Status of Unit ISSUED    Transfusion Status OK TO TRANSFUSE    Crossmatch Result      Compatible Performed at [facility], [place] [address], [place], [place] [address]   POC occult blood, ED Provider will collect     Status: Abnormal   Collection Time: 05/19/18 12:27 PM  Result Value Ref Range   Fecal Occult Bld POSITIVE (A) NEGATIVE  Hemoglobin     Status: Abnormal   Collection Time: 05/19/18  5:12 PM  Result Value Ref Range   Hemoglobin 7.8 (L) 13.0 - 17.0 g/dL    Comment: Performed at [facility] [address] [place], [place] [address]  Hematocrit     Status: Abnormal   Collection Time: 05/19/18  5:12 PM  Result Value Ref Range   HCT 23.0 (L) 39.0 - 52.0  %    Comment: Performed at [facility], [place] [address], [place], [place] [address]  Hemoglobin     Status: Abnormal   Collection Time: 05/19/18 11:26 PM  Result Value Ref Range   Hemoglobin 7.3 (L) 13.0 - 17.0 g/dL    Comment: Performed at [facility], [place] [address], [place], [place] [address]  Hematocrit     Status: Abnormal   Collection Time: 05/19/18 11:26 PM  Result Value Ref Range   HCT 21.3 (L) 39.0 - 52.0 %    Comment: Performed at [facility], [place] [address] [place], [place] [address]  CBC WITH DIFFERENTIAL     Status: Abnormal   Collection Time: 05/20/18  2:33 AM  Result Value Ref Range   WBC 7.2 4.0 - 10.5 K/uL   RBC 2.33 (L) 4.22 - 5.81 MIL/uL   Hemoglobin 7.1 (L) 13.0 - 17.0 g/dL   HCT 21.0 (L) 39.0 - 52.0 %   MCV 90.1 80.0 - 100.0 fL   MCH 30.5 26.0 - 34.0 pg   MCHC 33.8 30.0 - 36.0 g/dL   RDW 13.6 11.5 - 15.5 %   Platelets 163 150 - 400 K/uL   nRBC 0.0 0.0 - 0.2 %   Neutrophils Relative % 48 %   Neutro Abs 3.4 1.7 - 7.7 K/uL   Lymphocytes Relative 41 %   Lymphs Abs 3.0 0.7 - 4.0 K/uL   Monocytes Relative 8 %   Monocytes Absolute 0.6 0.1 - 1.0 K/uL   Eosinophils Relative 3 %   Eosinophils Absolute 0.2 0.0 - 0.5 K/uL   Basophils Relative 0 %   Basophils Absolute 0.0 0.0 - 0.1 K/uL   Immature Granulocytes 0 %   Abs Immature Granulocytes 0.02 0.00 - 0.07 K/uL    Comment: Performed at [facility], [address] [place], [place] [address]  Prepare RBC     Status: None   Collection Time: 05/20/18  7:05 AM  Result Value Ref Range   Order Confirmation      ORDER PROCESSED BY BLOOD BANK Performed at [facility], [place] [address],  [place], [place] [address]     Studies/Results: No results found.  Medications: I have reviewed the patient's current medications.  Assessment: Upper GI bleeding likely related to peptic ulcer disease History of gastric ulcer, H. pylori infection, treatment in the past History of NSAID and  aspirin use  Hemoglobin stable at 7.8/7.3/7.1 Blood pressure stable at 130/65 with a heart rate of 89 Remains hemodynamically stable   Plan: Conservative and expectant management. Start clear liquid diet. Continue Protonix drip for another 24 hours. If he has no evidence of further bleed in the next 24 hours and hemoglobin remains stable, plan to discharge on PPI twice daily for the next 6 to 8 weeks, and outpatient endoscopy. If patient has further episodes of melena/hemodynamic instability, will need inpatient emergent endoscopy. Details of current situation discussed with patient and admitting team. Patient verbalizes understanding and consents.    Ronnette Juniper 05/20/2018, 9:01 AM   Pager [phone] If no answer or after 5 PM call [phone]

## 2018-05-21 LAB — CBC
HCT: 22.4 % — ABNORMAL LOW (ref 39.0–52.0)
Hemoglobin: 7.6 g/dL — ABNORMAL LOW (ref 13.0–17.0)
MCH: 30.5 pg (ref 26.0–34.0)
MCHC: 33.9 g/dL (ref 30.0–36.0)
MCV: 90 fL (ref 80.0–100.0)
Platelets: 154 10*3/uL (ref 150–400)
RBC: 2.49 MIL/uL — ABNORMAL LOW (ref 4.22–5.81)
RDW: 14.4 % (ref 11.5–15.5)
WBC: 7.1 10*3/uL (ref 4.0–10.5)
nRBC: 0 % (ref 0.0–0.2)

## 2018-05-21 LAB — TYPE AND SCREEN
ABO/RH(D): O POS
Antibody Screen: NEGATIVE
Unit division: 0

## 2018-05-21 LAB — BPAM RBC
Blood Product Expiration Date: 202004142359
ISSUE DATE / TIME: 202004100842
Unit Type and Rh: 9500

## 2018-05-21 LAB — BASIC METABOLIC PANEL
Anion gap: 8 (ref 5–15)
BUN: 14 mg/dL (ref 8–23)
CO2: 23 mmol/L (ref 22–32)
Calcium: 8.1 mg/dL — ABNORMAL LOW (ref 8.9–10.3)
Chloride: 108 mmol/L (ref 98–111)
Creatinine, Ser: 1.03 mg/dL (ref 0.61–1.24)
GFR calc Af Amer: >60 mL/min (ref >60)
GFR calc non Af Amer: >60 mL/min (ref >60)
Glucose, Bld: 93 mg/dL (ref 70–99)
Potassium: 3.7 mmol/L (ref 3.5–5.1)
Sodium: 139 mmol/L (ref 135–145)

## 2018-05-21 MED ORDER — PANTOPRAZOLE SODIUM 40 MG PO TBEC: 40.0000 mg | DELAYED_RELEASE_TABLET | Freq: Two times a day (BID) | ORAL | Status: DC

## 2018-05-21 MED ORDER — PANTOPRAZOLE SODIUM 40 MG PO TBEC: 40.0000 mg | DELAYED_RELEASE_TABLET | Freq: Two times a day (BID) | ORAL | 0 refills | Status: DC

## 2018-05-21 MED ORDER — SODIUM CHLORIDE 0.9 % IV SOLN: 8.0000 mg/h | INTRAVENOUS | Status: DC

## 2018-05-21 NOTE — Discharge Instructions (Signed)
Gastrointestinal Bleeding ° °Gastrointestinal bleeding is bleeding somewhere along the path food travels through the body (digestive tract). This path is anywhere between the mouth and the opening of the butt (anus). You may have blood in your poop (stools) or have black poop. If you throw up (vomit), there may be blood in it. °This condition can be mild, serious, or even life-threatening. If you have a lot of bleeding, you may need to stay in the hospital. °Follow these instructions at home: °· Take over-the-counter and prescription medicines only as told by your doctor. °· Eat foods that have a lot of fiber in them. These foods include whole grains, fruits, and vegetables. You can also try eating 1-3 prunes each day. °· Drink enough fluid to keep your pee (urine) clear or pale yellow. °· Keep all follow-up visits as told by your doctor. This is important. °Contact a doctor if: °· Your symptoms do not get better. °Get help right away if: °· Your bleeding gets worse. °· You feel dizzy or you pass out (faint). °· You feel weak. °· You have very bad cramps in your back or belly (abdomen). °· You pass large clumps of blood (clots) in your poop. °· Your symptoms are getting worse. °This information is not intended to replace advice given to you by your health care provider. Make sure you discuss any questions you have with your health care provider. °Document Released: 11/05/2007 Document Revised: 07/04/2015 Document Reviewed: 07/16/2014 °Elsevier Interactive Patient Education © 2019 [employer] ° °

## 2018-05-21 NOTE — Progress Notes (Signed)
Subjective: The patient was seen and examined at bedside. Has not had a bowel movement in the last 48 hours. Denies abdominal pain, nausea or vomiting.  Objective: Vital signs in last 24 hours: Temp:  [97.9 F (36.6 C)-98.3 F (36.8 C)] 98 F (36.7 C) (04/11 0541) Pulse Rate:  [85-87] 85 (04/11 0541) Resp:  [16-18] 16 (04/11 0541) BP: (121-133)/(55-71) 121/55 (04/11 0541) SpO2:  [95 %-100 %] 98 % (04/11 0541) Weight:  [80 kg] 80 kg (04/11 0438) Weight change: -2.555 kg Last BM Date: 05/19/18  PE: Not in distress, mild pallor GENERAL: Able to speak in full sentences ABDOMEN: Soft, nondistended, nontender, normoactive bowel sounds EXTREMITIES: no Deformity  Lab Results: Results for orders placed or performed during the hospital encounter of 05/19/18 (from the past 48 hour(s))  Comprehensive metabolic panel     Status: Abnormal   Collection Time: 05/19/18 12:25 PM  Result Value Ref Range   Sodium 138 135 - 145 mmol/L   Potassium 4.2 3.5 - 5.1 mmol/L   Chloride 105 98 - 111 mmol/L   CO2 22 22 - 32 mmol/L   Glucose, Bld 122 (H) 70 - 99 mg/dL   BUN 34 (H) 8 - 23 mg/dL   Creatinine, Ser 1.11 0.61 - 1.24 mg/dL   Calcium 9.4 8.9 - 10.3 mg/dL   Total Protein 6.3 (L) 6.5 - 8.1 g/dL   Albumin 3.7 3.5 - 5.0 g/dL   AST 20 15 - 41 U/L   ALT 19 0 - 44 U/L   Alkaline Phosphatase 47 38 - 126 U/L   Total Bilirubin 0.6 0.3 - 1.2 mg/dL   GFR calc non Af Amer >60 >60 mL/min   GFR calc Af Amer >60 >60 mL/min   Anion gap 11 5 - 15    Comment: Performed at [facility], [address] [place], [place] [address]  CBC WITH DIFFERENTIAL     Status: Abnormal   Collection Time: 05/19/18 12:25 PM  Result Value Ref Range   WBC 9.9 4.0 - 10.5 K/uL   RBC 2.98 (L) 4.22 - 5.81 MIL/uL   Hemoglobin 9.3 (L) 13.0 - 17.0 g/dL   HCT 27.7 (L) 39.0 - 52.0 %   MCV 93.0 80.0 - 100.0 fL   MCH 31.2 26.0 - 34.0 pg   MCHC 33.6 30.0 - 36.0 g/dL   RDW 13.3 11.5 - 15.5 %   Platelets 203 150 - 400 K/uL    nRBC 0.0 0.0 - 0.2 %   Neutrophils Relative % 71 %   Neutro Abs 7.0 1.7 - 7.7 K/uL   Lymphocytes Relative 22 %   Lymphs Abs 2.2 0.7 - 4.0 K/uL   Monocytes Relative 7 %   Monocytes Absolute 0.7 0.1 - 1.0 K/uL   Eosinophils Relative 0 %   Eosinophils Absolute 0.0 0.0 - 0.5 K/uL   Basophils Relative 0 %   Basophils Absolute 0.0 0.0 - 0.1 K/uL   Immature Granulocytes 0 %   Abs Immature Granulocytes 0.04 0.00 - 0.07 K/uL    Comment: Performed at [facility] [address], [place], [place] [address]  Protime-INR     Status: None   Collection Time: 05/19/18 12:25 PM  Result Value Ref Range   Prothrombin Time 14.0 11.4 - 15.2 seconds   INR 1.1 0.8 - 1.2    Comment: (NOTE) INR goal varies based on device and disease states. Performed at [facility], [place] [address] [place], [place] [address]  Type and screen [place]     Status: None (Preliminary result)   Collection Time: 05/19/18 12:25 PM  Result Value Ref Range   ABO/RH(D) O POS    Antibody Screen NEG    Sample Expiration 05/22/2018    Unit Number [id number]    Blood Component Type RED CELLS,LR    Unit division 00    Status of Unit ISSUED    Transfusion Status OK TO TRANSFUSE    Crossmatch Result      Compatible Performed at [facility], [place] [address] [place], [place] [address]   POC occult blood, ED Provider will collect     Status: Abnormal   Collection Time: 05/19/18 12:27 PM  Result Value Ref Range   Fecal Occult Bld POSITIVE (A) NEGATIVE  Hemoglobin     Status: Abnormal   Collection Time: 05/19/18  5:12 PM  Result Value Ref Range   Hemoglobin 7.8 (L) 13.0 - 17.0 g/dL    Comment: Performed at [facility] [address], [place], [place] [address]  Hematocrit     Status: Abnormal   Collection Time: 05/19/18  5:12 PM  Result Value Ref Range   HCT 23.0 (L) 39.0 - 52.0 %    Comment: Performed at [facility], [place] [address], [place], [place] [address]   Hemoglobin     Status: Abnormal   Collection Time: 05/19/18 11:26 PM  Result Value Ref Range   Hemoglobin 7.3 (L) 13.0 - 17.0 g/dL    Comment: Performed at [facility], [place] [address] [place], [place] [address]  Hematocrit     Status: Abnormal   Collection Time: 05/19/18 11:26 PM  Result Value Ref Range   HCT 21.3 (L) 39.0 - 52.0 %    Comment: Performed at [facility], [place] [address] [place], [place] [address]  HIV antibody (Routine Testing)     Status: None   Collection Time: 05/20/18  2:33 AM  Result Value Ref Range   HIV Screen 4th Generation wRfx Non Reactive Non Reactive    Comment: (NOTE) Performed At: [facility] [place], [place] [id number] Rush Farmer MD GE:[id number]   CBC WITH DIFFERENTIAL     Status: Abnormal   Collection Time: 05/20/18  2:33 AM  Result Value Ref Range   WBC 7.2 4.0 - 10.5 K/uL   RBC 2.33 (L) 4.22 - 5.81 MIL/uL   Hemoglobin 7.1 (L) 13.0 - 17.0 g/dL   HCT 21.0 (L) 39.0 - 52.0 %   MCV 90.1 80.0 - 100.0 fL   MCH 30.5 26.0 - 34.0 pg   MCHC 33.8 30.0 - 36.0 g/dL   RDW 13.6 11.5 - 15.5 %   Platelets 163 150 - 400 K/uL   nRBC 0.0 0.0 - 0.2 %   Neutrophils Relative % 48 %   Neutro Abs 3.4 1.7 - 7.7 K/uL   Lymphocytes Relative 41 %   Lymphs Abs 3.0 0.7 - 4.0 K/uL   Monocytes Relative 8 %   Monocytes Absolute 0.6 0.1 - 1.0 K/uL   Eosinophils Relative 3 %   Eosinophils Absolute 0.2 0.0 - 0.5 K/uL   Basophils Relative 0 %   Basophils Absolute 0.0 0.0 - 0.1 K/uL   Immature Granulocytes 0 %   Abs Immature Granulocytes 0.02 0.00 - 0.07 K/uL    Comment: Performed at [facility], [place]. [address] [place],  [address]  Prepare RBC  Status: None   Collection Time: 05/20/18  7:05 AM  Result Value Ref Range   Order Confirmation      ORDER PROCESSED BY BLOOD BANK Performed at [facility], [place] [address] [place], [place] [address]   Hemoglobin and hematocrit, blood     Status: Abnormal    Collection Time: 05/20/18  1:51 PM  Result Value Ref Range   Hemoglobin 8.5 (L) 13.0 - 17.0 g/dL   HCT 25.2 (L) 39.0 - 52.0 %    Comment: Performed at [facility], [place] [address], [place], [place] [address]  CBC     Status: Abnormal   Collection Time: 05/21/18  2:54 AM  Result Value Ref Range   WBC 7.1 4.0 - 10.5 K/uL   RBC 2.49 (L) 4.22 - 5.81 MIL/uL   Hemoglobin 7.6 (L) 13.0 - 17.0 g/dL   HCT 22.4 (L) 39.0 - 52.0 %   MCV 90.0 80.0 - 100.0 fL   MCH 30.5 26.0 - 34.0 pg   MCHC 33.9 30.0 - 36.0 g/dL   RDW 14.4 11.5 - 15.5 %   Platelets 154 150 - 400 K/uL   nRBC 0.0 0.0 - 0.2 %    Comment: Performed at [facility], [place] [address], [place], [place] [address]  Basic metabolic panel     Status: Abnormal   Collection Time: 05/21/18  2:54 AM  Result Value Ref Range   Sodium 139 135 - 145 mmol/L   Potassium 3.7 3.5 - 5.1 mmol/L   Chloride 108 98 - 111 mmol/L   CO2 23 22 - 32 mmol/L   Glucose, Bld 93 70 - 99 mg/dL   BUN 14 8 - 23 mg/dL   Creatinine, Ser 1.03 0.61 - 1.24 mg/dL   Calcium 8.1 (L) 8.9 - 10.3 mg/dL   GFR calc non Af Amer >60 >60 mL/min   GFR calc Af Amer >60 >60 mL/min   Anion gap 8 5 - 15    Comment: Performed at [facility], [place] [address], [place], [place] [address]    Studies/Results: No results found.  Medications: I have reviewed the patient's current medications.  Assessment: Upper GI bleeding likely related to peptic ulcer disease, history of gastric ulcer and H. pylori infection with treatment in the past 1 unit PRBC transfusion given on 05/20/2018 Hb went up from 7.1-8.5, 7.6 today BUN has trended down from 34 to 14, no further melena reported in last 24 hours   Plan: Start regular diet. Advised to take PPI twice daily at least for the next 6 to 8 weeks. Plan an elective EGD in 6 to 8 weeks, hopefully when coronavirus crisis situation is over and we are allowed to do elective procedures. Advised patient to avoid NSAIDs and  aspirin. Remains hemodynamically stable. Ok to DC from GI standpoint.   Ronnette Juniper 05/21/2018, 9:06 AM   Pager [phone] If no answer or after 5 PM call [phone]

## 2018-05-21 NOTE — Discharge Summary (Signed)
Richard Arnold., is a 68 y.o. male  DOB 07-Nov-1950  MRN [id number].  Admission date:  05/19/2018  Admitting Physician  Reubin Milan, MD  Discharge Date:  05/21/2018   Primary MD  Susy Frizzle, MD  Recommendations for primary care physician for things to follow:   Repeat CBC in 5 to 7 days  Discharge Diagnosis   Principal Problem:   Gastrointestinal hemorrhage with melena Active Problems:   Acute blood loss anemia   Hypercholesteremia   Insomnia   Hypomagnesemia   GI bleed      Past Medical History:  Diagnosis Date   Arthritis    Brachial neuritis    Gastric ulcer    GI bleed    H pylori ulcer    Hypercholesteremia    Turner syndrome    left arm, some numbness in fingers    Past Surgical History:  Procedure Laterality Date   COLONOSCOPY WITH PROPOFOL N/A 03/05/2015   Procedure: COLONOSCOPY WITH PROPOFOL;  Surgeon: Garlan Fair, MD;  Location: [facility];  Service: Endoscopy;  Laterality: N/A;   ESOPHAGOGASTRODUODENOSCOPY  09/08/2011   Procedure: ESOPHAGOGASTRODUODENOSCOPY (EGD);  Surgeon: Winfield Cunas., MD;  Location: [facility];  Service: Endoscopy;  Laterality: N/A;   HERNIA REPAIR     INGUINAL HERNIA REPAIR Right 08/30/2013   Procedure: HERNIA REPAIR INGUINAL WITH MESH  ADULT;  Surgeon: Pedro Earls, MD;  Location: [facility];  Service: General;  Laterality: Right;   SHOULDER ARTHROSCOPY  2011   x4  one open rt, rotator cuff tear, Dr. Amedeo Plenty   TOTAL SHOULDER ARTHROPLASTY  01/30/2011   Procedure: TOTAL SHOULDER ARTHROPLASTY;  Surgeon: Augustin Schooling;  Location: [facility];  Service: Orthopedics;  Laterality: Right;  right total shoulder atrhoplasty       HPI  from the history and physical done on the day of admission:    Richard Boldman. is a 68 y.o. 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 Dennard Schaumann due to dark tarry stools since Monday, lightheadedness and decreased hemoglobin from 14 to 8.4 g/dL.  The patient recently resumed taking meloxicam last week.  He states that he started having melanotic stools since Tuesday.  He denies abdominal pain, nausea, emesis, constipation or hematochezia.  No dysuria, frequency or hematuria.  He has been lightheaded at times, but denies CP, dyspnea, palpitations, diaphoresis, PND, orthopnea or pitting edema of the lower extremities.  Denies polyuria, polydipsia, polyphagia or blurred vision.  ED Course: Initial vital signs temperature 97.8 F, pulse 99, respirations 16, blood pressure 160/83 mmHg and O2 sat 100% on room air.  The patient was given a Protonix loading dose and then was started on Protonix infusion.  He was seen by Dr. Laurence Spates from the gastroenterology service.  Fecal occult blood was positive.  White count was 9.9, hemoglobin 9.3 g/dL and platelets 203.  PT and INR were  normal.  CMP showed a glucose of 122, BUN of 34 mg/dL.  Total protein was 6.3 g/dL.  The rest of the values are within normal limits.    Hospital Course:     1.  Gastrointestinal hemorrhage with melena. Hx gastric ulcer biopsies positive for H. Pylori. Taking NSAIDs meloxicam lately for arthritis. No BM's since admission. Evaluated by gynecology who suspect likely upper GI bleed with nsaid related ulcerations versus H. pylori.  However, patient without bowel movement.  Patient was transfused 1 unit of blood and continued on Protonix infusion for 24 hours.  Discussed with the patient on the need to avoid aspirin and meloxicam.  Gastroenterology recommending patient take Protonix 40 mg twice daily for the next 6 to 8 weeks, and plan for outpatient elective EGD depending on COVID-19 risk at that  time.  2.  Acute blood loss anemia. Related to #1. Hg drifting down. Hg 7.1 on 4/10, from 9.3 just 14 hours prior.  Some aspect of this was thought to be possibly dilutional effect. BP also drifting down slightly.  Patient was transfused 1 unit of blood with repeat blood count 8.5, and subsequently 7.6 this a.m. Gastroenterology felt that if patient was still bleeding he still would have been passing blood rectally.  Recommending outpatient CBC check within the next 5 to 7 days to ensure blood counts improving.  Discharge instructions also included recommendation of iron supplementation to help build blood stores.  3.  Hypercholesteremia: Recommended patient continue home medications of atorvastatin and fish oil supplement outpatient setting  4.  Insomnia: Continue temazepam as needed    Follow UP  Follow-up Information    Susy Frizzle, MD. Schedule an appointment as soon as possible for a visit.   Specialty:  Family Medicine Why:  To have blood work to check CBC within 5 to 7 days Contact information: [address] [place] [address] [phone]        Ronnette Juniper, MD. Schedule an appointment as soon as possible for a visit.   Specialty:  Gastroenterology Why:  Tentatively in 4 - 6 weeks for elective endoscopy Contact information: [place] [address] [phone]            Consults obtained: Gastroenterology- Dr. Ronnette Juniper   Discharge Condition: Stable  Diet and Activity recommendation: See Discharge Instructions below   Discharge Instructions    Discharge instructions   Complete by:  As directed    Follow-up with Primary MD Cammie Mcgee. Pickard,  MD to arrange to have blood work of CBC checked within 5 to 7 days from discharge.  Want to make sure that your blood counts are staying stable or improving.  You were transfused 1 unit of blood during her hospital stay and prior to discharge her hemoglobin was 7.6.  Due to  Covid-19 we are not transfusing patients with additional blood unless hemoglobin is less than 7 g/dL.  Taking a daily vitamin with iron or iron supplements will help your body make new red blood cells to build up your blood counts.  Gastroenterology would like you to stay on Protonix 40 mg twice daily for the next 6 to 8 weeks.  Your primary care provider will likely need to continue this medicine.  Plan would be for a endoscopic evaluation at some point to further evaluate the possible cause of bleeding.  However, if you have a recurrence of bleeding seek medical attention as soon as possible.  (  we routinely change or add medications that can affect your baseline labs and fluid status, therefore we recommend that you get the mentioned basic workup next visit with your PCP, your PCP may decide not to get them or add new tests based on their clinical decision)  Activity: As tolerated    Disposition: Home    Diet: Heart Healthy     Special Instructions: If you have smoked or chewed Tobacco  in the last 2 yrs please stop smoking, stop any regular Alcohol  and or any Recreational drug use.  On your next visit with your primary care physician please Get Medicines reviewed and adjusted.  Please request your Susy Frizzle, MD to go over all Hospital Tests and Procedure/Radiological results at the follow up, please get all Hospital records sent to your Prim MD by signing hospital release before you go home.  If you experience worsening of your admission symptoms, develop shortness of breath, life threatening emergency, suicidal or homicidal thoughts you must seek medical attention immediately by calling 911 or calling your MD immediately  if symptoms less severe.  You Must read complete instructions/literature along with all the possible adverse reactions/side effects for all the Medicines you take and that have been prescribed to you. Take any new Medicines after you have completely understood and  accpet all the possible adverse reactions/side effects.   Do not drive, operate heavy machinery, perform activities at heights, swimming or participation in water activities or provide baby sitting services if your were admitted for syncope or siezures until you have seen by Primary MD or a Neurologist and advised to do so again.  Do not drive when taking Pain medications.  Do not take more than prescribed Pain, Sleep and Anxiety Medications  Wear Seat belts while driving.   Please note  You were cared for by a hospitalist during your hospital stay. If you have any questions about your discharge medications or the care you received while you were in the hospital after you are discharged, you can call the unit and asked to speak with the hospitalist on call if the hospitalist that took care of you is not available. Once you are discharged, your primary care physician will handle any further medical issues. Please note that NO REFILLS for any discharge medications will be authorized once you are discharged, as it is imperative that you return to your primary care physician (or establish a relationship with a primary care physician if you do not have one) for your aftercare needs so that they can reassess your need for medications and monitor your lab values.   Other Restrictions   Complete by:  As directed    Avoid use of aspirin and NSAIDs (Aleve, BC Goody powders, naproxen, Excedrin, etc)        Discharge Medications     Allergies as of 05/21/2018      Reactions   Monosodium Glutamate Swelling   Rash, throat swells   Tylox [oxycodone-acetaminophen]    Pt states he had a "weird feeling"   Latex Rash      Medication List    STOP taking these medications   aspirin EC 81 MG tablet   meloxicam 15 MG tablet Commonly known as:  MOBIC     TAKE these medications   atorvastatin 20 MG tablet Commonly known as:  LIPITOR Take 1 tablet (20 mg total) by mouth daily at 6 PM. What changed:   when to take this   diphenhydrAMINE 25 MG  tablet Commonly known as:  BENADRYL Take 25 mg by mouth 2 (two) times daily.   fish oil-omega-3 fatty acids 1000 MG capsule Take 2 g by mouth daily.   gabapentin 300 MG capsule Commonly known as:  NEURONTIN Take 300 mg by mouth at bedtime.   pantoprazole 40 MG tablet Commonly known as:  PROTONIX Take 1 tablet (40 mg total) by mouth 2 (two) times daily before a meal.   temazepam 30 MG capsule Commonly known as:  RESTORIL Take 1 capsule (30 mg total) by mouth at bedtime.       Major procedures and Radiology Reports - PLEASE review detailed and final reports for all details, in brief -    No results found.  Micro Results    No results found for this or any previous visit (from the past 240 hour(s)).     Today   Subjective    Richard Arnold today has been able to pass gas, but has had no recurrence of bloody bowel movements.  Denies feeling lightheaded, short of breath, or having any abdominal pain.  Patient feels fine, and is ready to go home if able.   Objective   Blood pressure (!) 121/55, pulse 85, temperature 98 F (36.7 C), temperature source Oral, resp. rate 16, height 5\' 8"  (1.727 m), weight 80 kg, SpO2 98 %.   Intake/Output Summary (Last 24 hours) at 05/21/2018 1021 Last data filed at 05/20/2018 1100 Gross per 24 hour  Intake 876.91 ml  Output --  Net 876.91 ml    Exam  Constitutional: Elderly male in NAD, calm, comfortable Eyes: PERRL, lids and conjunctivae normal ENMT: Mucous membranes are moist. Posterior pharynx clear of any exudate or lesions. Neck: normal, supple, no masses, no thyromegaly Respiratory: clear to auscultation bilaterally, no wheezing, no crackles. Normal respiratory effort. No accessory muscle use.  Cardiovascular: Regular rate and rhythm, no murmurs / rubs / gallops. No extremity edema. 2+ pedal pulses. No carotid bruits.  Abdomen: no tenderness, no masses palpated. No hepatosplenomegaly.  Bowel sounds positive.  Musculoskeletal: no clubbing / cyanosis. No joint deformity upper and lower extremities. Good ROM, no contractures. Normal muscle tone.  Skin: no rashes, lesions, ulcers. No induration Neurologic: CN 2-12 grossly intact. Sensation intact, DTR normal. Strength 5/5 in all 4.  Psychiatric: Normal judgment and insight. Alert and oriented x 3. Normal mood.    Data Review   CBC w Diff:  Lab Results  Component Value Date   WBC 7.1 05/21/2018   HGB 7.6 (L) 05/21/2018   HCT 22.4 (L) 05/21/2018   PLT 154 05/21/2018   LYMPHOPCT 41 05/20/2018   MONOPCT 8 05/20/2018   EOSPCT 3 05/20/2018   BASOPCT 0 05/20/2018    CMP:  Lab Results  Component Value Date   NA 139 05/21/2018   K 3.7 05/21/2018   CL 108 05/21/2018   CO2 23 05/21/2018   BUN 14 05/21/2018   CREATININE 1.03 05/21/2018   CREATININE 1.16 04/27/2018   PROT 6.3 (L) 05/19/2018   ALBUMIN 3.7 05/19/2018   BILITOT 0.6 05/19/2018   ALKPHOS 47 05/19/2018   AST 20 05/19/2018   ALT 19 05/19/2018  .   Total Time in preparing paper work, data evaluation and todays exam - 35 minutes  Norval Morton M.D on 05/21/2018 at 10:21 AM  [facility]   Office  [phone]

## 2018-05-21 NOTE — Plan of Care (Signed)
Adequate for discharge.

## 2018-05-23 ENCOUNTER — Other Ambulatory Visit: Payer: Self-pay | Admitting: Family Medicine

## 2018-05-23 ENCOUNTER — Telehealth: Payer: Self-pay | Admitting: Family Medicine

## 2018-05-23 DIAGNOSIS — D649 Anemia, unspecified: Secondary | ICD-10-CM

## 2018-05-23 DIAGNOSIS — D62 Acute posthemorrhagic anemia: Secondary | ICD-10-CM

## 2018-05-23 NOTE — Telephone Encounter (Signed)
Pt was released from hospital from a GI bleed and was told to follow up with [place] for labs. The hospital did recommend CBC in 5-7 days after dc- ok to do just labs or do you need to see him in ov?   CB # - [id number]

## 2018-05-23 NOTE — Telephone Encounter (Signed)
[place] with just lab check.

## 2018-05-23 NOTE — Telephone Encounter (Signed)
Pt aware and lab ordered

## 2018-05-26 ENCOUNTER — Other Ambulatory Visit: Payer: Medicare HMO

## 2018-05-26 ENCOUNTER — Other Ambulatory Visit: Payer: Self-pay

## 2018-05-26 DIAGNOSIS — D649 Anemia, unspecified: Secondary | ICD-10-CM | POA: Diagnosis not present

## 2018-05-26 DIAGNOSIS — D62 Acute posthemorrhagic anemia: Secondary | ICD-10-CM | POA: Diagnosis not present

## 2018-05-26 LAB — CBC WITH DIFFERENTIAL/PLATELET
Absolute Monocytes: 713 cells/uL (ref 200–950)
Basophils Absolute: 53 cells/uL (ref 0–200)
Basophils Relative: 0.8 %
Eosinophils Absolute: 442 cells/uL (ref 15–500)
Eosinophils Relative: 6.7 %
HCT: 27.7 % — ABNORMAL LOW (ref 38.5–50.0)
Hemoglobin: 9.3 g/dL — ABNORMAL LOW (ref 13.2–17.1)
Lymphs Abs: 1940 cells/uL (ref 850–3900)
MCH: 30.7 pg (ref 27.0–33.0)
MCHC: 33.6 g/dL (ref 32.0–36.0)
MCV: 91.4 fL (ref 80.0–100.0)
MPV: 11.4 fL (ref 7.5–12.5)
Monocytes Relative: 10.8 %
Neutro Abs: 3452 cells/uL (ref 1500–7800)
Neutrophils Relative %: 52.3 %
Platelets: 281 10*3/uL (ref 140–400)
RBC: 3.03 10*6/uL — ABNORMAL LOW (ref 4.20–5.80)
RDW: 14.1 % (ref 11.0–15.0)
Total Lymphocyte: 29.4 %
WBC: 6.6 10*3/uL (ref 3.8–10.8)

## 2018-06-15 ENCOUNTER — Other Ambulatory Visit: Payer: Self-pay | Admitting: Family Medicine

## 2018-06-15 MED ORDER — PANTOPRAZOLE SODIUM 40 MG PO TBEC: 40.0000 mg | DELAYED_RELEASE_TABLET | Freq: Two times a day (BID) | ORAL | 5 refills | Status: DC

## 2018-06-27 ENCOUNTER — Other Ambulatory Visit: Payer: Self-pay

## 2018-06-27 ENCOUNTER — Ambulatory Visit (INDEPENDENT_AMBULATORY_CARE_PROVIDER_SITE_OTHER): Payer: Medicare HMO | Admitting: Family Medicine

## 2018-06-27 VITALS — BP 120/70 | HR 68 | Temp 98.6°F | Resp 14 | Ht 67.0 in | Wt 190.0 lb

## 2018-06-27 DIAGNOSIS — D649 Anemia, unspecified: Secondary | ICD-10-CM | POA: Diagnosis not present

## 2018-06-27 DIAGNOSIS — R5382 Chronic fatigue, unspecified: Secondary | ICD-10-CM | POA: Diagnosis not present

## 2018-06-27 DIAGNOSIS — D62 Acute posthemorrhagic anemia: Secondary | ICD-10-CM | POA: Diagnosis not present

## 2018-06-27 LAB — HEMOGLOBIN, FINGERSTICK: POC HEMOGLOBIN: 10.3 g/dL — ABNORMAL LOW (ref 13.0–17.0)

## 2018-06-27 NOTE — Progress Notes (Signed)
Subjective:    Patient ID: Richard Beat., male    DOB: [birth year], 68 y.o.   MRN: [id number]  HPI  05/19/18 Patient was recently seen for his physical in mid March.  At that time his hemoglobin was normal at 14.6.  2 days ago he developed the gradual onset of epigastric abdominal discomfort.  The pain is constant.  It is 4 out of 10 in intensity.  There is no exacerbating or alleviating factors.  Food does not make the pain worse.  However he has had 3-4 dark black stools.  Patient discontinued his aspirin 2 days ago.  However he has been taking meloxicam occasionally for joint pain.  He denies any fever or chills or vomiting.  He has had some left-sided lower back pain however associated with diarrhea.  The diarrhea is melanotic in nature.  Fingerstick hemoglobin was obtained today and hemoglobin was down to 8!Marland Kitchen  And week and tired.  His blood pressure is relatively low where in the past it has been elevated.  He denies any chest pain or shortness of breath however he is mildly tachycardic with heart rate my exam.  At that time, my plan was: Patient has melena x2 days.  He has epigastric abdominal pain x2 days with no exacerbating or alleviating factors.  He has had several episodes of melanotic stool and diarrhea.  Hemoglobin has dropped from 14.6-8.  All this is concerning for an upper GI bleed with melena.  Given his tachycardia , the rapid drop in his hemoglobin, and his fatigue, patient needs hospital admission for monitoring and urgent GI consult with EGD.  Patient may require transfusion if hemoglobin drops further.  Patient is stable to be transported by personal vehicle.  Emergency room is notified  06/27/18 Admission date:  05/19/2018  Admitting Physician  Reubin Milan, MD  Discharge Date:  05/21/2018   Primary MD  Susy Frizzle, MD  Recommendations for primary care physician for things to follow:   Repeat CBC in 5 to 7 days  Discharge Diagnosis   Principal Problem:    Gastrointestinal hemorrhage with melena Active Problems:   Acute blood loss anemia   Hypercholesteremia   Insomnia   Hypomagnesemia   GI bleed          Past Medical History:  Diagnosis Date  . Arthritis   . Brachial neuritis   . Gastric ulcer   . GI bleed   . H pylori ulcer   . Hypercholesteremia   . Turner syndrome    left arm, some numbness in fingers         Past Surgical History:  Procedure Laterality Date  . COLONOSCOPY WITH PROPOFOL N/A 03/05/2015   Procedure: COLONOSCOPY WITH PROPOFOL;  Surgeon: Garlan Fair, MD;  Location: [facility];  Service: Endoscopy;  Laterality: N/A;  . ESOPHAGOGASTRODUODENOSCOPY  09/08/2011   Procedure: ESOPHAGOGASTRODUODENOSCOPY (EGD);  Surgeon: Winfield Cunas., MD;  Location: [facility];  Service: Endoscopy;  Laterality: N/A;  . HERNIA REPAIR    . INGUINAL HERNIA REPAIR Right 08/30/2013   Procedure: HERNIA REPAIR INGUINAL WITH MESH  ADULT;  Surgeon: Pedro Earls, MD;  Location: [facility];  Service: General;  Laterality: Right;  . SHOULDER ARTHROSCOPY  2011   x4  one open rt, rotator cuff tear, Dr. Amedeo Plenty  . 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 Dennard Schaumann due to dark tarry stools since Monday, lightheadedness and decreased hemoglobin from 14 to 8.4 g/dL. The patient recently resumed taking meloxicam last week. He states that he started having melanotic stools since Tuesday. He denies abdominal pain, nausea, emesis, constipation or hematochezia. No  dysuria, frequency or hematuria. He has been lightheaded at times, but denies CP, dyspnea, palpitations, diaphoresis, PND, orthopnea or pitting edema of the lower extremities. Denies polyuria, polydipsia, polyphagia or blurred vision.  ED Course:Initial vital signs temperature 97.8 F, pulse 99, respirations 16, blood pressure 160/83 mmHg and O2 sat 100% on room air. The patient was given a Protonix loading dose and then was started on Protonix infusion. He was seen by Dr. Laurence Spates from the gastroenterology service.  Fecal occult blood was positive. White count was 9.9, hemoglobin 9.3 g/dL and platelets 203. PT and INR were normal. CMP showed a glucose of 122, BUN of 34 mg/dL. Total protein was 6.3 g/dL. The rest of the values are within normal limits.    Hospital Course:     1.  Gastrointestinal hemorrhage with melena. Hx gastric ulcer biopsies positive for H. Pylori. Taking NSAIDs meloxicam lately for arthritis. No BM's since admission. Evaluated by gynecology who suspect likely upper GI bleed with nsaid related ulcerations versus H. pylori.  However, patient without bowel movement.  Patient was transfused 1 unit of blood and continued on Protonix infusion for 24 hours.  Discussed with the patient on the need to avoid aspirin and meloxicam.  Gastroenterology recommending patient take Protonix 40 mg twice daily for the next 6 to 8 weeks, and plan for outpatient elective EGD depending on COVID-19 risk at that time.  2.  Acute blood loss anemia. Related to #1. Hg drifting down. Hg 7.1 on 4/10, from 9.3 just 14 hours prior.  Some aspect of this was thought to be possibly dilutional effect. BP also drifting down slightly.  Patient was transfused 1 unit of blood with repeat blood count 8.5, and subsequently 7.6 this a.m. Gastroenterology felt that if patient was still bleeding he still would have been passing blood rectally.  Recommending outpatient CBC check within the next 5 to 7 days  to ensure blood counts improving.  Discharge instructions also included recommendation of iron supplementation to help build blood stores.  3.  Hypercholesteremia: Recommended patient continue home medications of atorvastatin and fish oil supplement outpatient setting  4.  Insomnia: Continue temazepam as needed   I copied the discharge summary from his hospitalization and included above for my reference.  Patient did come into the office for repeat cbc but was not seen as a visit due his fear of COVID-19.  However at that time hemoglobin had risen from 7.6 (4/11) to 9.3 (4/16).  Patient presents today complaining of feeling tired and weak.  Patient denies any chest pain.  He denies any shortness of breath or dyspnea on exertion.  2 weeks ago he went to the beach and was able to walk up and around a large bridge without any angina.  However he reports just feeling weak and tired.  He has been taking 325 mg of iron sulfate as recommended plus a multivitamin.  However fingerstick hemoglobin today is only up to 10.3.  I would anticipate a more robust response after being on iron now for 6 weeks suggesting possible underlying occult blood loss.  He denies any fevers or chills or weight loss.  He does have a resting tremor but this is chronic.  He denies any palpitations or anxiety.  He does report mildly depressed libido and chronic erectile problems however there has not been a sudden change to suggest hypogonadism as a cause of his fatigue. Past Medical History:  Diagnosis Date  . Arthritis   . Brachial neuritis   . Gastric ulcer   . GI bleed   . H pylori ulcer   . Hypercholesteremia   . Turner syndrome    left arm, some numbness in fingers   Past Surgical History:  Procedure Laterality Date  . COLONOSCOPY WITH PROPOFOL N/A 03/05/2015   Procedure: COLONOSCOPY WITH PROPOFOL;  Surgeon: Garlan Fair, MD;  Location: [facility];  Service: Endoscopy;  Laterality: N/A;  .  ESOPHAGOGASTRODUODENOSCOPY  09/08/2011   Procedure: ESOPHAGOGASTRODUODENOSCOPY (EGD);  Surgeon: Winfield Cunas., MD;  Location: [facility];  Service: Endoscopy;  Laterality: N/A;  . HERNIA REPAIR    . INGUINAL HERNIA REPAIR Right 08/30/2013   Procedure: HERNIA REPAIR INGUINAL WITH MESH  ADULT;  Surgeon: Pedro Earls, MD;  Location: [facility];  Service: General;  Laterality: Right;  . SHOULDER ARTHROSCOPY  2011   x4  one open rt, rotator cuff tear, Dr. Amedeo Plenty  . TOTAL SHOULDER ARTHROPLASTY  01/30/2011   Procedure: TOTAL SHOULDER ARTHROPLASTY;  Surgeon: Augustin Schooling;  Location: [place];  Service: Orthopedics;  Laterality: Right;  right total shoulder atrhoplasty   Current Outpatient Medications on File Prior to Visit  Medication Sig Dispense Refill  . atorvastatin (LIPITOR) 20 MG tablet Take 1 tablet (20 mg total) by mouth daily at 6 PM. (Patient taking differently: Take 20 mg by mouth daily. ) 90 tablet 3  . diphenhydrAMINE (BENADRYL) 25 MG tablet Take 25 mg by mouth 2 (two) times daily.    . fish oil-omega-3 fatty acids 1000 MG capsule Take 2 g by mouth daily.     . pantoprazole (PROTONIX) 40 MG tablet Take 1 tablet (40 mg total) by mouth 2 (two) times daily before a meal. 60 tablet 5  . temazepam (RESTORIL) 30 MG capsule Take 1 capsule (30 mg total) by mouth at bedtime. 90 capsule 1   No current facility-administered medications on file prior to visit.    Allergies  Allergen Reactions  . Monosodium Glutamate Swelling    Rash, throat swells  . Tylox [Oxycodone-Acetaminophen]     Pt states he had a "weird feeling"  . Latex Rash   Social History   Socioeconomic History  . Marital status: Married    Spouse name: Not on file  . Number of children: Not on file  . Years of education: Not on file  . Highest education level: Not on file  Occupational History  . Not on file  Social Needs  . Financial resource strain: Not on file  . Food insecurity:    Worry: Not on file     Inability: Not on file  . Transportation needs:    Medical: Not on file    Non-medical: Not on file  Tobacco Use  . Smoking status: Never Smoker  . Smokeless tobacco: Never Used  Substance and Sexual Activity  . Alcohol use: No  . Drug use: No  .  Sexual activity: Not on file  Lifestyle  . Physical activity:    Days per week: Not on file    Minutes per session: Not on file  . Stress: Not on file  Relationships  . Social connections:    Talks on phone: Not on file    Gets together: Not on file    Attends religious service: Not on file    Active member of club or organization: Not on file    Attends meetings of clubs or organizations: Not on file    Relationship status: Not on file  . Intimate partner violence:    Fear of current or ex partner: Not on file    Emotionally abused: Not on file    Physically abused: Not on file    Forced sexual activity: Not on file  Other Topics Concern  . Not on file  Social History Narrative  . Not on file      Review of Systems  All other systems reviewed and are negative.      Objective:   Physical Exam Constitutional:      General: He is not in acute distress.    Appearance: He is not ill-appearing, toxic-appearing or diaphoretic.  Cardiovascular:     Rate and Rhythm: Normal rate and regular rhythm.     Heart sounds: Normal heart sounds. No murmur. No friction rub. No gallop.   Pulmonary:     Effort: Pulmonary effort is normal. No respiratory distress.     Breath sounds: Normal breath sounds. No stridor. No wheezing or rhonchi.  Abdominal:     General: Abdomen is flat. Bowel sounds are normal. There is no distension.     Palpations: Abdomen is soft.     Tenderness: There is no abdominal tenderness. There is no guarding.  Neurological:     Mental Status: He is alert.           Assessment & Plan:  Acute blood loss anemia - Plan: Hemoglobin, fingerstick, CBC with Differential/Platelet, COMPLETE METABOLIC PANEL WITH GFR,  Iron, Vitamin B12, TSH  Low hemoglobin - Plan: Hemoglobin, fingerstick, CBC with Differential/Platelet, COMPLETE METABOLIC PANEL WITH GFR, Iron, Vitamin B12, TSH  Chronic fatigue - Plan: CBC with Differential/Platelet, COMPLETE METABOLIC PANEL WITH GFR, Iron, Vitamin B12, TSH  I believe the most likely explanation for the patient's fatigue is his anemia.  The anemia is mild but it is still profound for a person who was greater than 14 earlier this year.  I would expect a more robust response from a person who is taken iron for 6 weeks.  His fingerstick hemoglobin is only up to 10.3.  Therefore I suspect that the patient is either not absorbing iron or may have ongoing blood loss.  Therefore I will check an iron level.  If his iron level is low, the patient may benefit from iron infusion.  However if the iron level is normal, I believe the patient needs a GI consultation with a EGD to determine the exact cause of his blood loss and, if necessary, to stop the source of bleeding if it is in fact an ulcer.  I will also obtain lab work to rule out other potential causes of fatigue including a CMP to monitor his kidney and liver function as well as blood sugar.  I will check an iron level, B12 level, TSH, and a testosterone level

## 2018-06-28 LAB — CBC WITH DIFFERENTIAL/PLATELET
Absolute Monocytes: 589 cells/uL (ref 200–950)
Basophils Absolute: 41 cells/uL (ref 0–200)
Basophils Relative: 0.9 %
Eosinophils Absolute: 179 cells/uL (ref 15–500)
Eosinophils Relative: 3.9 %
HCT: 38.5 % (ref 38.5–50.0)
Hemoglobin: 12.8 g/dL — ABNORMAL LOW (ref 13.2–17.1)
Lymphs Abs: 1569 cells/uL (ref 850–3900)
MCH: 30.8 pg (ref 27.0–33.0)
MCHC: 33.2 g/dL (ref 32.0–36.0)
MCV: 92.8 fL (ref 80.0–100.0)
MPV: 11.4 fL (ref 7.5–12.5)
Monocytes Relative: 12.8 %
Neutro Abs: 2222 cells/uL (ref 1500–7800)
Neutrophils Relative %: 48.3 %
Platelets: 242 10*3/uL (ref 140–400)
RBC: 4.15 10*6/uL — ABNORMAL LOW (ref 4.20–5.80)
RDW: 13 % (ref 11.0–15.0)
Total Lymphocyte: 34.1 %
WBC: 4.6 10*3/uL (ref 3.8–10.8)

## 2018-06-28 LAB — COMPLETE METABOLIC PANEL WITH GFR
AG Ratio: 1.6 (calc) (ref 1.0–2.5)
ALT: 14 U/L (ref 9–46)
AST: 21 U/L (ref 10–35)
Albumin: 4.2 g/dL (ref 3.6–5.1)
Alkaline phosphatase (APISO): 61 U/L (ref 35–144)
BUN/Creatinine Ratio: 16 (calc) (ref 6–22)
BUN: 19 mg/dL (ref 7–25)
CO2: 19 mmol/L — ABNORMAL LOW (ref 20–32)
Calcium: 9.6 mg/dL (ref 8.6–10.3)
Chloride: 108 mmol/L (ref 98–110)
Creat: 1.19 mg/dL — ABNORMAL HIGH (ref 0.70–1.18)
GFR, Est African American: 71 mL/min/{1.73_m2} (ref >=60)
GFR, Est Non African American: 62 mL/min/{1.73_m2} (ref >=60)
Globulin: 2.6 g/dL (calc) (ref 1.9–3.7)
Glucose, Bld: 84 mg/dL (ref 65–99)
Potassium: 4.7 mmol/L (ref 3.5–5.3)
Sodium: 143 mmol/L (ref 135–146)
Total Bilirubin: 0.5 mg/dL (ref 0.2–1.2)
Total Protein: 6.8 g/dL (ref 6.1–8.1)

## 2018-06-28 LAB — IRON: Iron: 85 ug/dL (ref 50–180)

## 2018-06-28 LAB — TESTOSTERONE TOTAL,FREE,BIO, MALES
Albumin: 4.2 g/dL (ref 3.6–5.1)
Sex Hormone Binding: 63 nmol/L (ref 22–77)
Testosterone, Bioavailable: 67 ng/dL (ref 15.0–150.0)
Testosterone, Free: 34.8 pg/mL (ref 6.0–73.0)
Testosterone: 458 ng/dL (ref 250–827)

## 2018-06-28 LAB — VITAMIN B12: Vitamin B-12: 440 pg/mL (ref 200–1100)

## 2018-06-28 LAB — TSH: TSH: 1.12 mIU/L (ref 0.40–4.50)

## 2018-06-30 DIAGNOSIS — K921 Melena: Secondary | ICD-10-CM | POA: Diagnosis not present

## 2018-06-30 DIAGNOSIS — Z86010 Personal history of colonic polyps: Secondary | ICD-10-CM | POA: Diagnosis not present

## 2018-07-01 IMAGING — CR DG CHEST 2V
2 series · 2 of 2 positions shown · non-contrast
Comparison: None.

CLINICAL DATA: Cough.

EXAM:
CHEST  2 VIEW

[w chest pa]
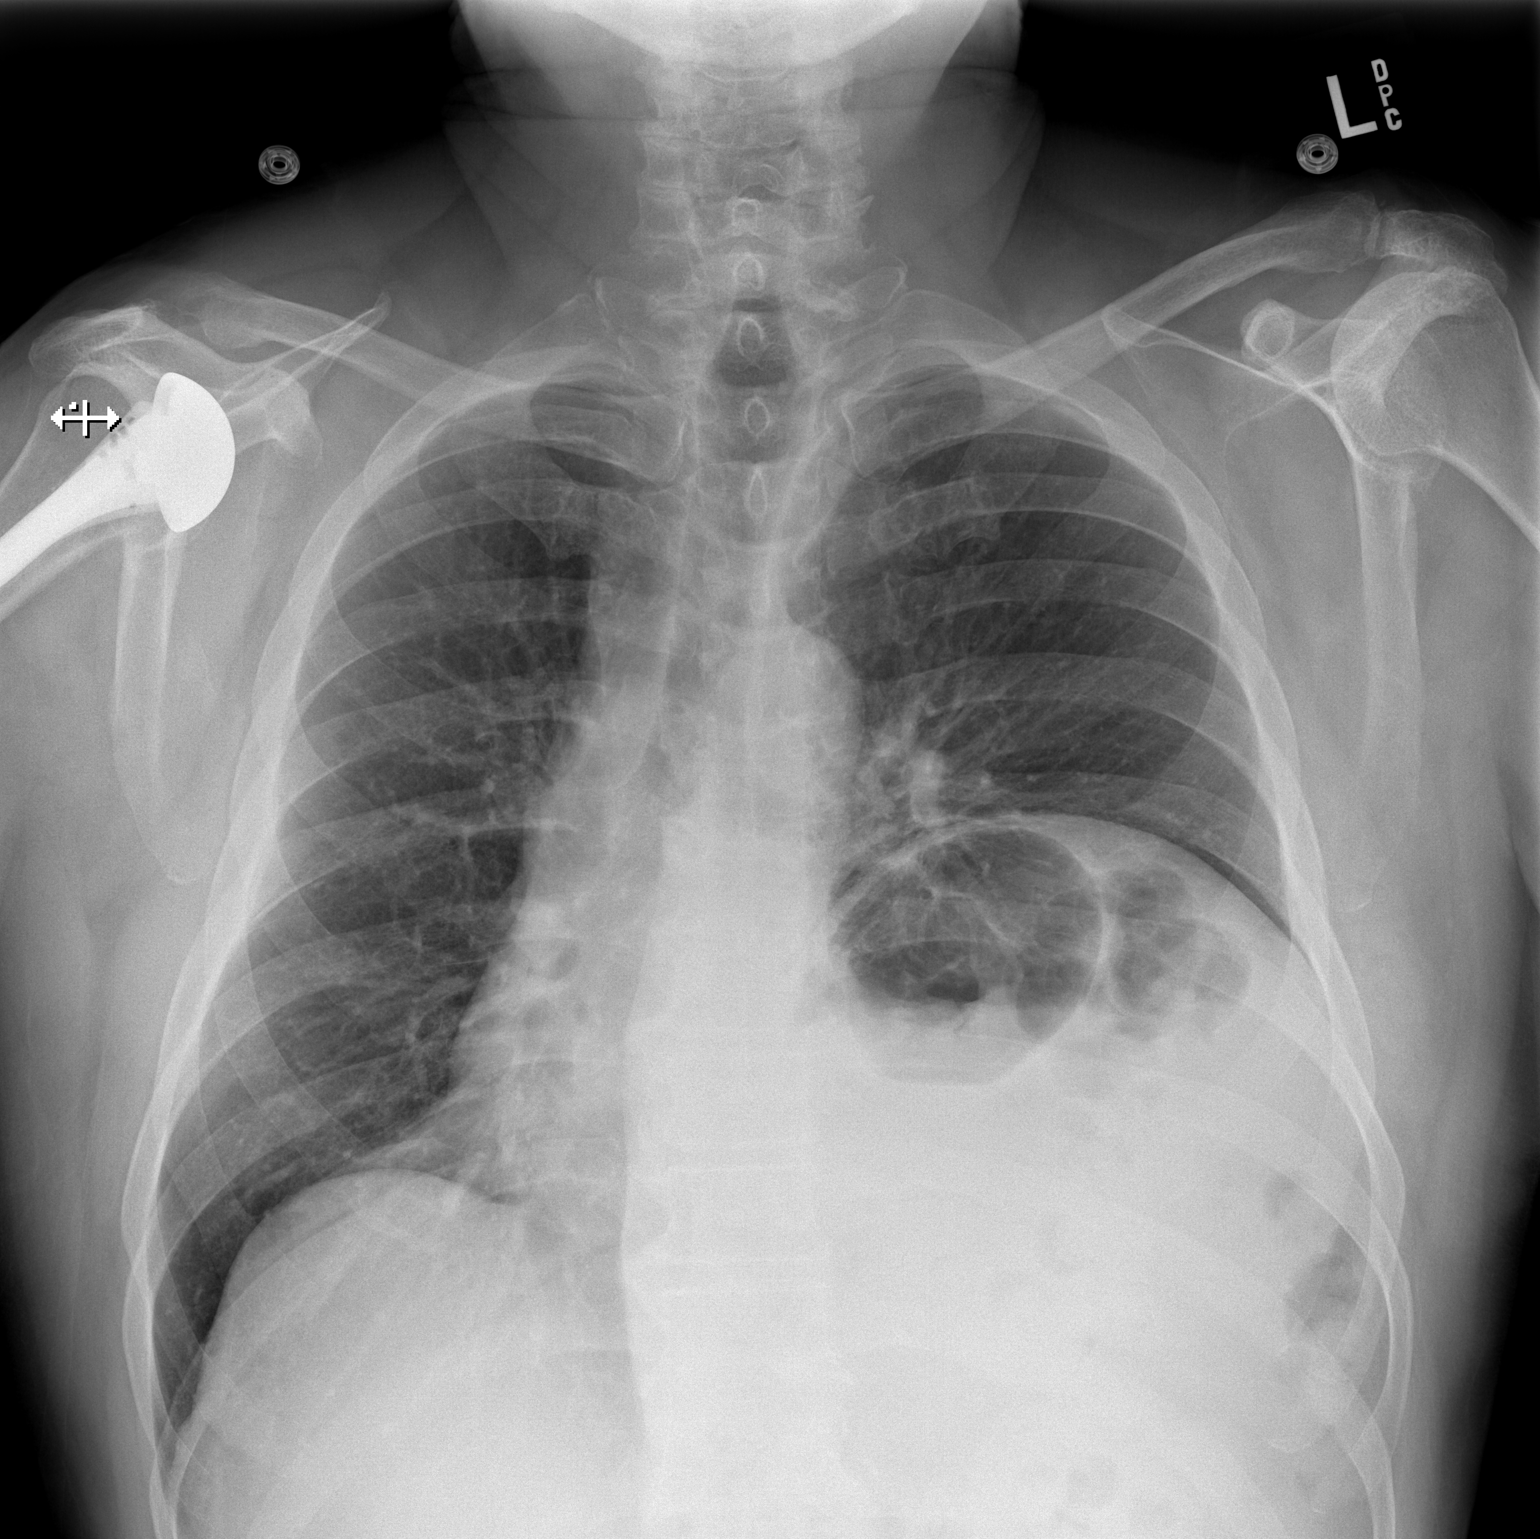

[w chest lat]
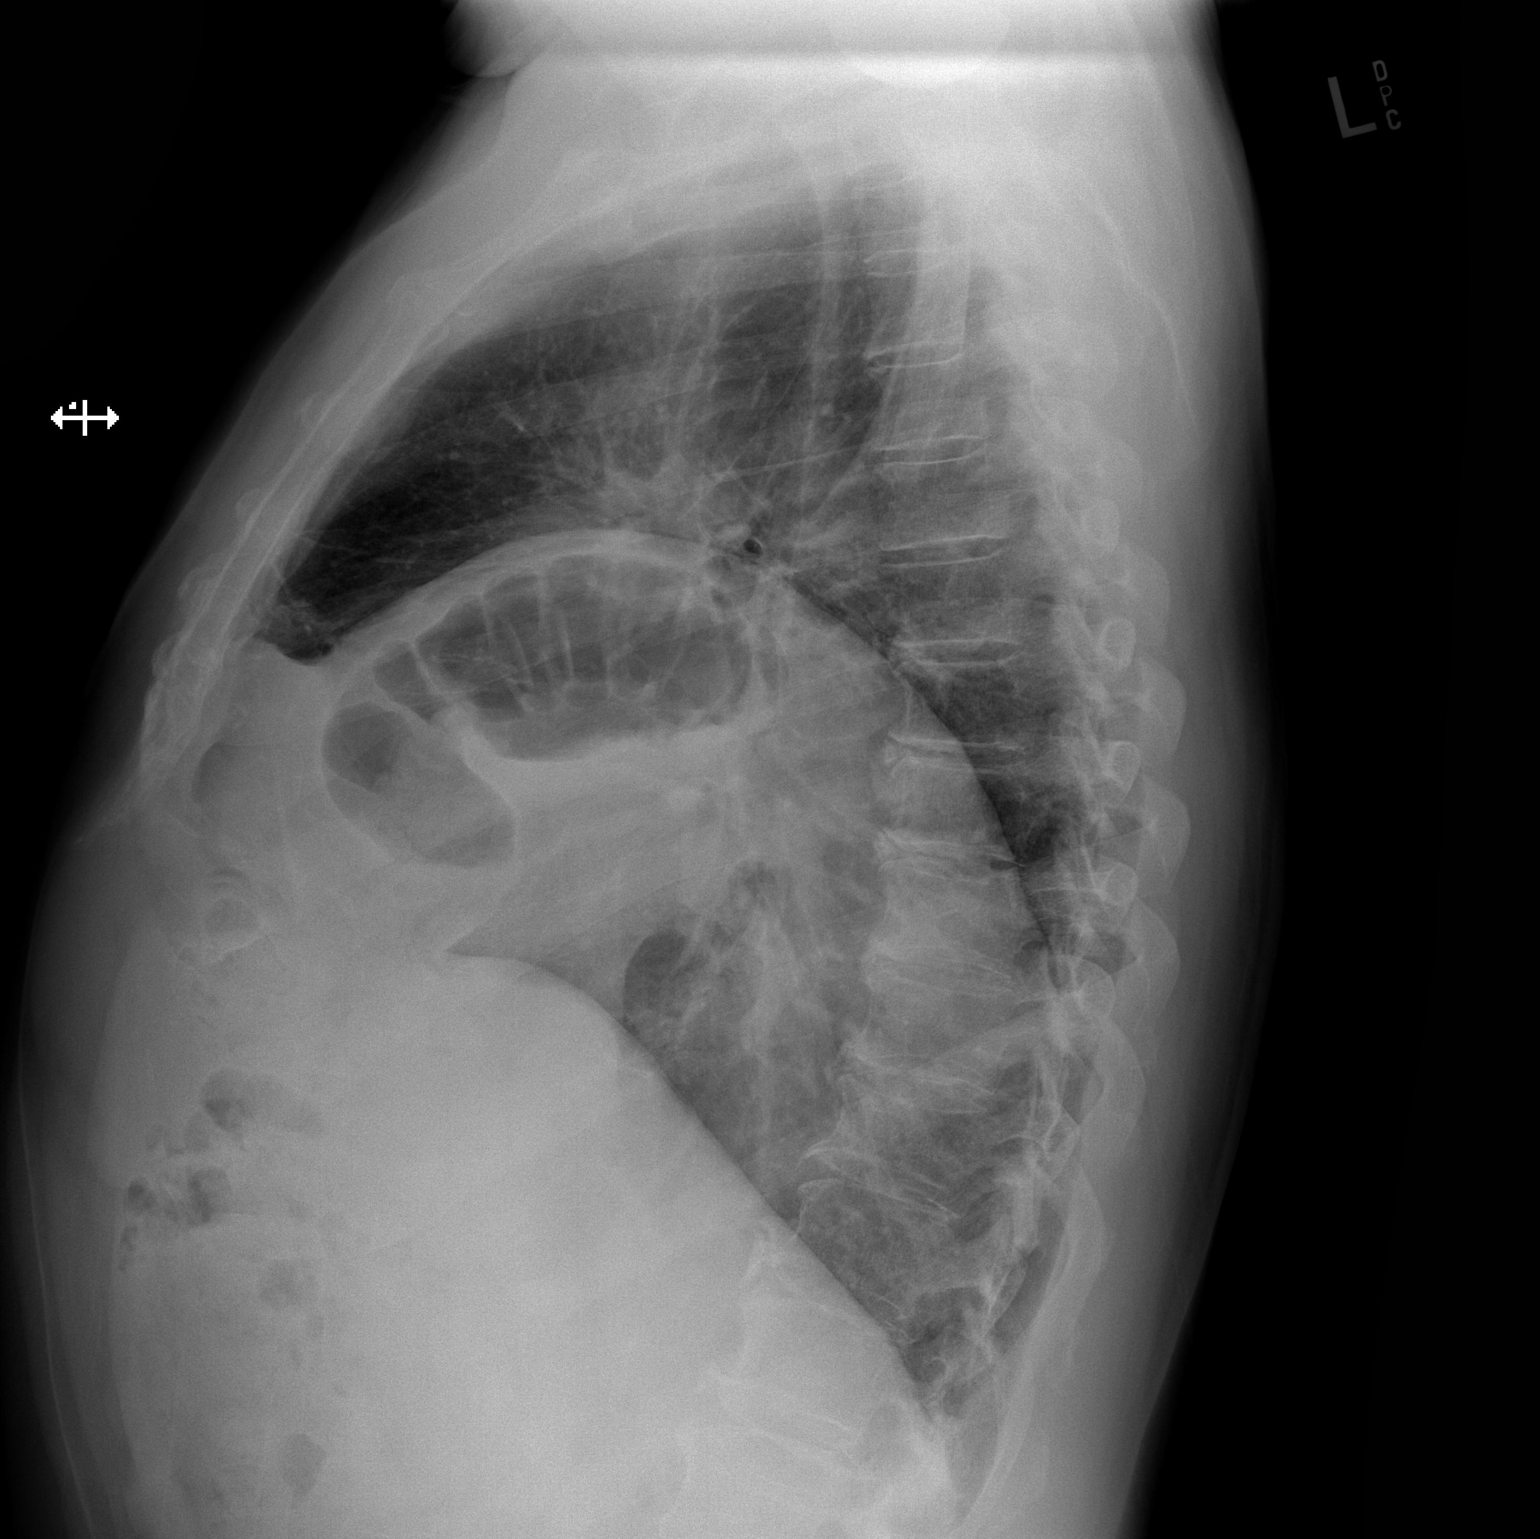

[2 of 2 positions shown; findings below may reference images not displayed]

FINDINGS: The heart size and mediastinal contours are within normal limits.
Both lungs are clear. No pneumothorax or pleural effusion is noted.
Elevated left hemidiaphragm is noted. Status post right shoulder
arthroplasty.
IMPRESSION: Elevated left hemidiaphragm. No acute cardiopulmonary abnormality
seen.

## 2018-07-14 DIAGNOSIS — K319 Disease of stomach and duodenum, unspecified: Secondary | ICD-10-CM | POA: Diagnosis not present

## 2018-07-14 DIAGNOSIS — K921 Melena: Secondary | ICD-10-CM | POA: Diagnosis not present

## 2018-07-19 DIAGNOSIS — K319 Disease of stomach and duodenum, unspecified: Secondary | ICD-10-CM | POA: Diagnosis not present

## 2018-10-04 DIAGNOSIS — H524 Presbyopia: Secondary | ICD-10-CM | POA: Diagnosis not present

## 2018-10-04 DIAGNOSIS — H52223 Regular astigmatism, bilateral: Secondary | ICD-10-CM | POA: Diagnosis not present

## 2018-10-04 DIAGNOSIS — H5203 Hypermetropia, bilateral: Secondary | ICD-10-CM | POA: Diagnosis not present

## 2018-10-04 DIAGNOSIS — H2513 Age-related nuclear cataract, bilateral: Secondary | ICD-10-CM | POA: Diagnosis not present

## 2018-11-15 ENCOUNTER — Other Ambulatory Visit: Payer: Self-pay | Admitting: Family Medicine

## 2018-11-15 MED ORDER — TEMAZEPAM 30 MG PO CAPS: 30.0000 mg | ORAL_CAPSULE | Freq: Every day | ORAL | 1 refills | Status: DC

## 2018-11-15 NOTE — Telephone Encounter (Signed)
Requesting refill    Temazepam  LOV: 06/27/18  LRF:  06/02/18

## 2018-12-05 ENCOUNTER — Other Ambulatory Visit: Payer: Self-pay

## 2018-12-06 ENCOUNTER — Encounter: Payer: Self-pay | Admitting: Family Medicine

## 2018-12-06 ENCOUNTER — Ambulatory Visit: Payer: Medicare HMO | Admitting: Family Medicine

## 2018-12-06 ENCOUNTER — Ambulatory Visit (INDEPENDENT_AMBULATORY_CARE_PROVIDER_SITE_OTHER): Payer: Medicare HMO | Admitting: Family Medicine

## 2018-12-06 VITALS — BP 136/80 | HR 80 | Temp 97.8°F | Resp 16 | Ht 67.0 in | Wt 193.0 lb

## 2018-12-06 DIAGNOSIS — M7711 Lateral epicondylitis, right elbow: Secondary | ICD-10-CM

## 2018-12-06 DIAGNOSIS — M7591 Shoulder lesion, unspecified, right shoulder: Secondary | ICD-10-CM | POA: Diagnosis not present

## 2018-12-06 DIAGNOSIS — S39012A Strain of muscle, fascia and tendon of lower back, initial encounter: Secondary | ICD-10-CM

## 2018-12-06 MED ORDER — PREDNISONE 20 MG PO TABS: ORAL_TABLET | ORAL | 0 refills | Status: DC

## 2018-12-06 MED ORDER — TIZANIDINE HCL 4 MG PO TABS: 4.0000 mg | ORAL_TABLET | Freq: Four times a day (QID) | ORAL | 0 refills | Status: DC | PRN

## 2018-12-06 NOTE — Progress Notes (Signed)
Subjective:    Patient ID: Richard Beat., male    DOB: 1951-01-17, 68 y.o.   MRN: [id number]  HPI Patient reports 3 separate problems.  First he was loading mulch recently and injured his lower back.  Ever since he has had pain in his right lower back just above his right gluteus.  On exam today he has a palpable tender muscle spasm in that area.  I am able to reproduce the pain by palpation over the muscle spasm as diagrammed below.  He denies any neuropathic pain radiating down his right leg.  He denies any numbness or weakness in his right leg.  He denies any saddle anesthesia.  He denies any falls or injuries.  He denies hearing a pop in his lower back.  The pain occurred gradually after lifting the mulch.  He noticed it the next few days.  However it has been steadily worsening.  He also has pain in his right shoulder.  He has pain with abduction greater than 100 degrees.  He has pain with resisted abduction.  He has a positive drop sign.  He also has pain with empty can testing.  He has no pain with internal or external rotation.  This occurred after he was using an impact wrench to remove his lawnmower blades.  At roughly the same time he also developed pain in his right elbow.  The pain is located over the lateral epicondyle and distal to the lateral epicondyle.  He has pain with palpation in that area.  He has pain with gripping objects with his right hand.  He has pain with dorsiflexion of the wrist.  There is no swelling or bruising or erythema or crepitus.  He has normal range of motion in the elbow Past Medical History:  Diagnosis Date  . Arthritis   . Brachial neuritis   . Gastric ulcer   . GI bleed   . H pylori ulcer   . Hypercholesteremia   . Turner syndrome    left arm, some numbness in fingers   Past Surgical History:  Procedure Laterality Date  . COLONOSCOPY WITH PROPOFOL N/A 03/05/2015   Procedure: COLONOSCOPY WITH PROPOFOL;  Surgeon: Garlan Fair, MD;  Location: [facility];  Service: Endoscopy;  Laterality: N/A;  . ESOPHAGOGASTRODUODENOSCOPY  09/08/2011   Procedure: ESOPHAGOGASTRODUODENOSCOPY (EGD);  Surgeon: Winfield Cunas., MD;  Location: [facility];  Service: Endoscopy;  Laterality: N/A;  . HERNIA REPAIR    . INGUINAL HERNIA REPAIR Right 08/30/2013   Procedure: HERNIA REPAIR INGUINAL WITH MESH  ADULT;  Surgeon: Pedro Earls, MD;  Location: [facility];  Service: General;  Laterality: Right;  . SHOULDER ARTHROSCOPY  2011   x4  one open rt, rotator cuff tear, Dr. Amedeo Plenty  . TOTAL SHOULDER ARTHROPLASTY  01/30/2011   Procedure: TOTAL SHOULDER ARTHROPLASTY;  Surgeon: Augustin Schooling;  Location: [place];  Service: Orthopedics;  Laterality: Right;  right total shoulder atrhoplasty   Current Outpatient Medications on File Prior to Visit  Medication Sig Dispense Refill  . acetaminophen (TYLENOL) 650 MG CR tablet Take 650 mg by mouth every 8 (eight) hours as needed for pain.    Marland Kitchen atorvastatin (LIPITOR) 20 MG tablet Take 1 tablet (20 mg total) by mouth daily at 6 PM. (Patient taking differently: Take 20 mg by mouth daily. ) 90 tablet 3  . diphenhydrAMINE (BENADRYL) 25 MG tablet Take 25 mg by mouth 2 (two) times daily.    . ferrous  sulfate 325 (65 FE) MG tablet Take 325 mg by mouth daily with breakfast.    . fish oil-omega-3 fatty acids 1000 MG capsule Take 2 g by mouth daily.     . temazepam (RESTORIL) 30 MG capsule Take 1 capsule (30 mg total) by mouth at bedtime. 90 capsule 1  . pantoprazole (PROTONIX) 40 MG tablet Take 1 tablet (40 mg total) by mouth 2 (two) times daily before a meal. (Patient not taking: Reported on 12/06/2018) 60 tablet 5   No current facility-administered medications on file prior to visit.    Allergies  Allergen Reactions  . Monosodium Glutamate Swelling    Rash, throat swells  . Tylox [Oxycodone-Acetaminophen]     Pt states he had a "weird feeling"  . Latex Rash   Social History   Socioeconomic History  . Marital status:  Married    Spouse name: Not on file  . Number of children: Not on file  . Years of education: Not on file  . Highest education level: Not on file  Occupational History  . Not on file  Social Needs  . Financial resource strain: Not on file  . Food insecurity    Worry: Not on file    Inability: Not on file  . Transportation needs    Medical: Not on file    Non-medical: Not on file  Tobacco Use  . Smoking status: Never Smoker  . Smokeless tobacco: Never Used  Substance and Sexual Activity  . Alcohol use: No  . Drug use: No  . Sexual activity: Not on file  Lifestyle  . Physical activity    Days per week: Not on file    Minutes per session: Not on file  . Stress: Not on file  Relationships  . Social Herbalist on phone: Not on file    Gets together: Not on file    Attends religious service: Not on file    Active member of club or organization: Not on file    Attends meetings of clubs or organizations: Not on file    Relationship status: Not on file  . Intimate partner violence    Fear of current or ex partner: Not on file    Emotionally abused: Not on file    Physically abused: Not on file    Forced sexual activity: Not on file  Other Topics Concern  . Not on file  Social History Narrative  . Not on file      Review of Systems  All other systems reviewed and are negative.      Objective:   Physical Exam Constitutional:      General: He is not in acute distress.    Appearance: He is not ill-appearing, toxic-appearing or diaphoretic.  Cardiovascular:     Rate and Rhythm: Normal rate and regular rhythm.     Heart sounds: Normal heart sounds. No murmur. No friction rub. No gallop.   Pulmonary:     Effort: Pulmonary effort is normal. No respiratory distress.     Breath sounds: Normal breath sounds. No stridor. No wheezing or rhonchi.  Musculoskeletal:     Right shoulder: He exhibits decreased range of motion, pain and decreased strength. He exhibits no  tenderness, no bony tenderness, no crepitus and no spasm.     Right elbow: He exhibits normal range of motion, no swelling and no effusion. Tenderness found. Lateral epicondyle tenderness noted. No medial epicondyle tenderness noted.     Lumbar back:  He exhibits decreased range of motion, tenderness, pain and spasm. He exhibits no bony tenderness.       Back:  Neurological:     Mental Status: He is alert.           Assessment & Plan:  Low back strain, initial encounter  Lateral epicondylitis of right elbow  Supraspinatus tendinitis, right  Patient has a palpable muscle spasm in his right lower back.  I believe he strained a lower back muscle.  He has no symptoms of sciatica or lumbar radiculopathy.  Therefore we will treat this with a combination of a prednisone taper pack since he is unable to tolerate NSAIDs and use tizanidine 4 mg every 6 hours as needed for muscle spasms.  He appears to have supraspinatus tendinitis in his right shoulder.  I recommended that the patient try the prednisone as prescribed above.  If symptoms or not improving he can return for cortisone injection into the right subacromial space.  Patient also appears to have lateral epicondylitis in his right elbow.  I recommended an elbow strap and the prednisone as prescribed above.  If this does not improve, patient can return for cortisone injection as well.

## 2018-12-16 ENCOUNTER — Other Ambulatory Visit: Payer: Self-pay

## 2018-12-16 MED ORDER — TIZANIDINE HCL 4 MG PO TABS: 4.0000 mg | ORAL_TABLET | Freq: Four times a day (QID) | ORAL | 0 refills | Status: DC | PRN

## 2018-12-16 MED ORDER — PREDNISONE 20 MG PO TABS: ORAL_TABLET | ORAL | 0 refills | Status: DC

## 2018-12-16 NOTE — Telephone Encounter (Signed)
Requested Prescriptions   Pending Prescriptions Disp Refills  . tiZANidine (ZANAFLEX) 4 MG tablet 30 tablet 0    Sig: Take 1 tablet (4 mg total) by mouth every 6 (six) hours as needed for muscle spasms.  . predniSONE (DELTASONE) 20 MG tablet 12 tablet 0    Sig: 3 tabs poqday 1-2, 2 tabs poqday 3-4, 1 tab poqday 5-6    Last OV 12/06/2018  Last written 12/06/2018  Pt states he is still experiencing pain and would like another round.

## 2019-01-20 DIAGNOSIS — L570 Actinic keratosis: Secondary | ICD-10-CM | POA: Diagnosis not present

## 2019-01-20 DIAGNOSIS — C44319 Basal cell carcinoma of skin of other parts of face: Secondary | ICD-10-CM | POA: Diagnosis not present

## 2019-01-20 DIAGNOSIS — L814 Other melanin hyperpigmentation: Secondary | ICD-10-CM | POA: Diagnosis not present

## 2019-01-20 DIAGNOSIS — Z85828 Personal history of other malignant neoplasm of skin: Secondary | ICD-10-CM | POA: Diagnosis not present

## 2019-01-20 DIAGNOSIS — D1801 Hemangioma of skin and subcutaneous tissue: Secondary | ICD-10-CM | POA: Diagnosis not present

## 2019-01-20 DIAGNOSIS — L821 Other seborrheic keratosis: Secondary | ICD-10-CM | POA: Diagnosis not present

## 2019-01-20 DIAGNOSIS — C4401 Basal cell carcinoma of skin of lip: Secondary | ICD-10-CM | POA: Diagnosis not present

## 2019-02-08 ENCOUNTER — Other Ambulatory Visit: Payer: Self-pay | Admitting: Family Medicine

## 2019-02-15 ENCOUNTER — Telehealth: Payer: Self-pay | Admitting: Family Medicine

## 2019-02-15 NOTE — Telephone Encounter (Signed)
Richard Arnold ID# [id number]

## 2019-02-15 NOTE — Telephone Encounter (Signed)
PA Submitted through [email or web address] and received the following:  Additional Information Required [facility] is not able to process this request through [place], please contact the plan at [phone] or fax in request to [phone].  Called and spoke to [place] and was transferred to an [place], [place]. Gave her information and she will send to clinical review and they will either call me or fax an additional form to be filled out.  Called pt, LMOVM, stating that this has been done and I am waiting on his insurance with a response back. Also it is fairly cheap if he would like to pay for this our of pocket and how to go about doing that and if he wanted to do that I would assist him in anyway I could.

## 2019-02-17 NOTE — Telephone Encounter (Signed)
[facility] sent additional paperwork - filled out and faxed to [place]

## 2019-02-21 DIAGNOSIS — Z85828 Personal history of other malignant neoplasm of skin: Secondary | ICD-10-CM | POA: Diagnosis not present

## 2019-02-21 DIAGNOSIS — C4401 Basal cell carcinoma of skin of lip: Secondary | ICD-10-CM | POA: Diagnosis not present

## 2019-02-21 NOTE — Telephone Encounter (Addendum)
Approved through ins - 02/10/2019-02/09/2020

## 2019-02-22 NOTE — Telephone Encounter (Signed)
Pharmacy made aware via vm

## 2019-05-01 ENCOUNTER — Other Ambulatory Visit: Payer: Self-pay

## 2019-05-01 ENCOUNTER — Ambulatory Visit (INDEPENDENT_AMBULATORY_CARE_PROVIDER_SITE_OTHER): Payer: Medicare HMO | Admitting: Family Medicine

## 2019-05-01 VITALS — BP 136/72 | HR 70 | Temp 97.4°F | Resp 18 | Ht 67.0 in | Wt 201.0 lb

## 2019-05-01 DIAGNOSIS — Z125 Encounter for screening for malignant neoplasm of prostate: Secondary | ICD-10-CM

## 2019-05-01 DIAGNOSIS — E7800 Pure hypercholesterolemia, unspecified: Secondary | ICD-10-CM

## 2019-05-01 DIAGNOSIS — Z0001 Encounter for general adult medical examination with abnormal findings: Secondary | ICD-10-CM

## 2019-05-01 DIAGNOSIS — Z8719 Personal history of other diseases of the digestive system: Secondary | ICD-10-CM | POA: Diagnosis not present

## 2019-05-01 DIAGNOSIS — Z23 Encounter for immunization: Secondary | ICD-10-CM

## 2019-05-01 DIAGNOSIS — G545 Neuralgic amyotrophy: Secondary | ICD-10-CM | POA: Diagnosis not present

## 2019-05-01 DIAGNOSIS — Z0000 Encounter for general adult medical examination without abnormal findings: Secondary | ICD-10-CM

## 2019-05-01 LAB — CBC WITH DIFFERENTIAL/PLATELET
Absolute Monocytes: 546 cells/uL (ref 200–950)
Basophils Absolute: 48 cells/uL (ref 0–200)
Basophils Relative: 0.9 %
Eosinophils Absolute: 239 cells/uL (ref 15–500)
Eosinophils Relative: 4.5 %
HCT: 44 % (ref 38.5–50.0)
Hemoglobin: 14.2 g/dL (ref 13.2–17.1)
Lymphs Abs: 1834 cells/uL (ref 850–3900)
MCH: 30.3 pg (ref 27.0–33.0)
MCHC: 32.3 g/dL (ref 32.0–36.0)
MCV: 93.8 fL (ref 80.0–100.0)
MPV: 10.9 fL (ref 7.5–12.5)
Monocytes Relative: 10.3 %
Neutro Abs: 2634 cells/uL (ref 1500–7800)
Neutrophils Relative %: 49.7 %
Platelets: 223 10*3/uL (ref 140–400)
RBC: 4.69 10*6/uL (ref 4.20–5.80)
RDW: 13 % (ref 11.0–15.0)
Total Lymphocyte: 34.6 %
WBC: 5.3 10*3/uL (ref 3.8–10.8)

## 2019-05-01 LAB — COMPLETE METABOLIC PANEL WITH GFR
AG Ratio: 1.6 (calc) (ref 1.0–2.5)
ALT: 16 U/L (ref 9–46)
AST: 21 U/L (ref 10–35)
Albumin: 4.2 g/dL (ref 3.6–5.1)
Alkaline phosphatase (APISO): 59 U/L (ref 35–144)
BUN/Creatinine Ratio: 15 (calc) (ref 6–22)
BUN: 19 mg/dL (ref 7–25)
CO2: 29 mmol/L (ref 20–32)
Calcium: 9.9 mg/dL (ref 8.6–10.3)
Chloride: 104 mmol/L (ref 98–110)
Creat: 1.23 mg/dL — ABNORMAL HIGH (ref 0.70–1.18)
GFR, Est African American: 69 mL/min/{1.73_m2} (ref >=60)
GFR, Est Non African American: 59 mL/min/{1.73_m2} — ABNORMAL LOW (ref >=60)
Globulin: 2.7 g/dL (calc) (ref 1.9–3.7)
Glucose, Bld: 98 mg/dL (ref 65–99)
Potassium: 4.6 mmol/L (ref 3.5–5.3)
Sodium: 140 mmol/L (ref 135–146)
Total Bilirubin: 0.7 mg/dL (ref 0.2–1.2)
Total Protein: 6.9 g/dL (ref 6.1–8.1)

## 2019-05-01 LAB — LIPID PANEL
Cholesterol: 149 mg/dL (ref <200)
HDL: 42 mg/dL (ref >=40)
LDL Cholesterol (Calc): 82 mg/dL (calc)
Non-HDL Cholesterol (Calc): 107 mg/dL (calc) (ref <130)
Total CHOL/HDL Ratio: 3.5 (calc) (ref <5.0)
Triglycerides: 152 mg/dL — ABNORMAL HIGH (ref <150)

## 2019-05-01 LAB — PSA: PSA: 0.4 ng/mL (ref <=4.0)

## 2019-05-01 MED ORDER — TAMSULOSIN HCL 0.4 MG PO CAPS: 0.4000 mg | ORAL_CAPSULE | Freq: Every day | ORAL | 3 refills | Status: DC

## 2019-05-01 MED ORDER — PREDNISONE 20 MG PO TABS: ORAL_TABLET | ORAL | 0 refills | Status: DC

## 2019-05-01 NOTE — Progress Notes (Signed)
Subjective:    Patient ID: Richard Beat., male    DOB: 07-22-1950, 69 y.o.   MRN: [id number]  HPI  Patient is a very pleasant 69 year old Caucasian male here today for complete physical exam.  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  This is extensively been worked up by neurology.  He also has a history of a gastric ulcer causing a GI bleed last year.  He also has a history of hyperlipidemia.  He is due for prostate cancer screening with a PSA.  His last colonoscopy was 2017 and is not due again until 2027.  His most recent immunization records are listed below. Immunization History  Administered Date(s) Administered  . Influenza, High Dose Seasonal PF 11/11/2017  . Influenza-Unspecified 11/23/2016, 11/29/2018  . PFIZER SARS-COV-2 Vaccination 04/17/2019, 04/20/2019  . Pneumococcal Conjugate-13 04/19/2017  . Pneumococcal Polysaccharide-23 05/01/2019   Of note, the patient reports right-sided low back pain radiating into his right posterior hip.  He also reports burning and stinging pain in his anterior right shin down to his foot that occurs immediately with standing and walking.  This is been going on now since October.  It is made worse by prolonged standing and walking.  Symptoms are concerning for lumbar radiculopathy.  Patient is due for Pneumovax 23.  Otherwise his immunizations are up-to-date including his COVID-19 vaccination. Past Medical History:  Diagnosis Date  . Arthritis   . Brachial neuritis   . Gastric ulcer   . GI bleed   . H pylori ulcer   . Hypercholesteremia   . Turner syndrome    left arm, some numbness in fingers   Past Surgical History:  Procedure Laterality Date  . COLONOSCOPY WITH PROPOFOL N/A 03/05/2015   Procedure: COLONOSCOPY WITH PROPOFOL;  Surgeon: Garlan Fair, MD;  Location: [facility];  Service: Endoscopy;  Laterality: N/A;  . ESOPHAGOGASTRODUODENOSCOPY  09/08/2011   Procedure:  ESOPHAGOGASTRODUODENOSCOPY (EGD);  Surgeon: Winfield Cunas., MD;  Location: [facility];  Service: Endoscopy;  Laterality: N/A;  . HERNIA REPAIR    . INGUINAL HERNIA REPAIR Right 08/30/2013   Procedure: HERNIA REPAIR INGUINAL WITH MESH  ADULT;  Surgeon: Pedro Earls, MD;  Location: [facility];  Service: General;  Laterality: Right;  . SHOULDER ARTHROSCOPY  2011   x4  one open rt, rotator cuff tear, Dr. Amedeo Plenty  . TOTAL SHOULDER ARTHROPLASTY  01/30/2011   Procedure: TOTAL SHOULDER ARTHROPLASTY;  Surgeon: Augustin Schooling;  Location: [place];  Service: Orthopedics;  Laterality: Right;  right total shoulder atrhoplasty   Current Outpatient Medications on File Prior to Visit  Medication Sig Dispense Refill  . acetaminophen (TYLENOL) 650 MG CR tablet Take 650 mg by mouth every 8 (eight) hours as needed for pain.    Marland Kitchen atorvastatin (LIPITOR) 20 MG tablet TAKE 1 TABLET (20 MG TOTAL) BY MOUTH DAILY AT 6 PM. 90 tablet 3  . diphenhydrAMINE (BENADRYL) 25 MG tablet Take 25 mg by mouth 2 (two) times daily.    . ferrous sulfate 325 (65 FE) MG tablet Take 325 mg by mouth daily with breakfast.    . fish oil-omega-3 fatty acids 1000 MG capsule Take 2 g by mouth daily.     . pantoprazole (PROTONIX) 40 MG tablet TAKE 1 TABLET BY MOUTH TWICE A DAY BEFORE A MEAL 180 tablet 1  . temazepam (RESTORIL) 30 MG capsule Take 1 capsule (30 mg total) by mouth at  bedtime. 90 capsule 1  . tiZANidine (ZANAFLEX) 4 MG tablet Take 1 tablet (4 mg total) by mouth every 6 (six) hours as needed for muscle spasms. 30 tablet 0   No current facility-administered medications on file prior to visit.   Allergies  Allergen Reactions  . Monosodium Glutamate Swelling    Rash, throat swells  . Tylox [Oxycodone-Acetaminophen]     Pt states he had a "weird feeling"  . Latex Rash   Social History   Socioeconomic History  . Marital status: Married    Spouse name: Not on file  . Number of children: Not on file  . Years of education: Not  on file  . Highest education level: Not on file  Occupational History  . Not on file  Tobacco Use  . Smoking status: Never Smoker  . Smokeless tobacco: Never Used  Substance and Sexual Activity  . Alcohol use: No  . Drug use: No  . Sexual activity: Not on file  Other Topics Concern  . Not on file  Social History Narrative  . Not on file   Social Determinants of Health   Financial Resource Strain:   . Difficulty of Paying Living Expenses:   Food Insecurity:   . Worried About Charity fundraiser in the Last Year:   . Arboriculturist in the Last Year:   Transportation Needs:   . Film/video editor (Medical):   Marland Kitchen Lack of Transportation (Non-Medical):   Physical Activity:   . Days of Exercise per Week:   . Minutes of Exercise per Session:   Stress:   . Feeling of Stress :   Social Connections:   . Frequency of Communication with Friends and Family:   . Frequency of Social Gatherings with Friends and Family:   . Attends Religious Services:   . Active Member of Clubs or Organizations:   . Attends Archivist Meetings:   Marland Kitchen Marital Status:   Intimate Partner Violence:   . Fear of Current or Ex-Partner:   . Emotionally Abused:   Marland Kitchen Physically Abused:   . Sexually Abused:    Family History  Problem Relation Age of Onset  . Heart disease Mother   . Heart disease Father    Family history is significant for a history of CABG in his father later in life   Review of Systems  All other systems reviewed and are negative.      Objective:   Physical Exam Vitals reviewed.  Constitutional:      General: He is not in acute distress.    Appearance: He is well-developed. He is not diaphoretic.  HENT:     Head: Normocephalic and atraumatic.     Right Ear: External ear normal.     Left Ear: External ear normal.     Nose: Nose normal.     Mouth/Throat:     Pharynx: No oropharyngeal exudate.  Eyes:     General: No scleral icterus.       Right eye: No discharge.         Left eye: No discharge.     Conjunctiva/sclera: Conjunctivae normal.     Pupils: Pupils are equal, round, and reactive to light.  Neck:     Thyroid: No thyromegaly.     Vascular: No JVD.     Trachea: No tracheal deviation.  Cardiovascular:     Rate and Rhythm: Normal rate and regular rhythm.     Heart sounds: Normal heart sounds.  No murmur. No friction rub. No gallop.   Pulmonary:     Effort: Pulmonary effort is normal. No respiratory distress.     Breath sounds: Normal breath sounds. No stridor. No wheezing or rales.  Chest:     Chest wall: No tenderness.  Abdominal:     General: Bowel sounds are normal. There is no distension.     Palpations: Abdomen is soft. There is no mass.     Tenderness: There is no abdominal tenderness. There is no guarding or rebound.  Musculoskeletal:        General: No tenderness or deformity. Normal range of motion.     Cervical back: Normal range of motion and neck supple.  Lymphadenopathy:     Cervical: No cervical adenopathy.  Skin:    General: Skin is warm.     Coloration: Skin is not pale.     Findings: No erythema or rash.  Neurological:     Mental Status: He is alert and oriented to person, place, and time.     Cranial Nerves: No cranial nerve deficit.     Motor: No abnormal muscle tone.     Coordination: Coordination normal.     Deep Tendon Reflexes: Reflexes are normal and symmetric.  Psychiatric:        Behavior: Behavior normal.        Thought Content: Thought content normal.        Judgment: Judgment normal.           Assessment & Plan:  Pure hypercholesterolemia - Plan: CBC with Differential/Platelet, COMPLETE METABOLIC PANEL WITH GFR, Lipid panel  Prostate cancer screening - Plan: PSA  Need for prophylactic vaccination against Streptococcus pneumoniae (pneumococcus) - Plan: Pneumococcal polysaccharide vaccine 23-valent greater than or equal to 2yo subcutaneous/IM  General medical exam  [place]-Turner  syndrome-left upper extremity  History of GI bleed  Physical exam today is completely normal aside from the neuropathy in his left arm.  I will screen for prostate cancer with PSA.  His blood pressure is excellent 136/72.  I will check a CBC, CMP, and fasting lipid panel.  His goal LDL cholesterol is less than 100.  He received Pneumovax 23 today to update his immunizations.  His colonoscopy is up-to-date.  He denies any issues with depression, falls, or minerals.  I believe the neuropathy in his right leg could be due to lumbar radiculopathy.  1 week after he gets the Pneumovax 23 I would like the patient to try a prednisone taper pack and if this improves the right-sided nerve like pain in his right leg I would proceed with an MRI of the lumbar spine to evaluate further.

## 2019-05-08 DIAGNOSIS — R69 Illness, unspecified: Secondary | ICD-10-CM | POA: Diagnosis not present

## 2019-05-10 ENCOUNTER — Telehealth: Payer: Self-pay | Admitting: Family Medicine

## 2019-05-10 ENCOUNTER — Other Ambulatory Visit: Payer: Self-pay | Admitting: Family Medicine

## 2019-05-10 NOTE — Telephone Encounter (Signed)
Pt called and states that he took the prednisone taper pack and he is doing better except for his foot. It still hurts some. He also states that the Asencion [employer] is working well.

## 2019-05-10 NOTE — Telephone Encounter (Signed)
Requesting refill    Temazepam  LOV:  05/01/2019  LRF:  11/15/2018

## 2019-05-11 NOTE — Telephone Encounter (Signed)
Pt would like to think about it as he is some better and will call back if he wants [place] to order the MRI.

## 2019-05-11 NOTE — Telephone Encounter (Signed)
Does he want me to schedule mri of the back to see if he has a pinched nerve?

## 2019-05-29 DIAGNOSIS — R69 Illness, unspecified: Secondary | ICD-10-CM | POA: Diagnosis not present

## 2019-07-18 DIAGNOSIS — R69 Illness, unspecified: Secondary | ICD-10-CM | POA: Diagnosis not present

## 2019-09-13 DIAGNOSIS — R69 Illness, unspecified: Secondary | ICD-10-CM | POA: Diagnosis not present

## 2019-09-19 ENCOUNTER — Other Ambulatory Visit: Payer: Self-pay

## 2019-09-19 ENCOUNTER — Ambulatory Visit (INDEPENDENT_AMBULATORY_CARE_PROVIDER_SITE_OTHER): Payer: Medicare HMO | Admitting: Family Medicine

## 2019-09-19 VITALS — BP 150/80 | HR 52 | Temp 98.3°F | Ht 67.0 in | Wt 200.0 lb

## 2019-09-19 DIAGNOSIS — R1032 Left lower quadrant pain: Secondary | ICD-10-CM | POA: Diagnosis not present

## 2019-09-19 MED ORDER — CIPROFLOXACIN HCL 500 MG PO TABS: 500.0000 mg | ORAL_TABLET | Freq: Two times a day (BID) | ORAL | 0 refills | Status: DC

## 2019-09-19 MED ORDER — METRONIDAZOLE 500 MG PO TABS: 500.0000 mg | ORAL_TABLET | Freq: Two times a day (BID) | ORAL | 0 refills | Status: DC

## 2019-09-19 NOTE — Progress Notes (Signed)
Subjective:    Patient ID: Richard Beat., male    DOB: Nov 25, 1950, 69 y.o.   MRN: [id number]  HPI    06/2018 Hospital Admission   Hospital Course:     1.  Gastrointestinal hemorrhage with melena. Hx gastric ulcer biopsies positive for H. Pylori. Taking NSAIDs meloxicam lately for arthritis. No BM's since admission. Evaluated by GI who suspect likely upper GI bleed with nsaid related ulcerations versus H. pylori.  However, patient without bowel movement.  Patient was transfused 1 unit of blood and continued on Protonix infusion for 24 hours.  Discussed with the patient on the need to avoid aspirin and meloxicam.  Gastroenterology recommending patient take Protonix 40 mg twice daily for the next 6 to 8 weeks, and plan for outpatient elective EGD depending on COVID-19 risk at that time.  2.  Acute blood loss anemia. Related to #1. Hg drifting down. Hg 7.1 on 4/10, from 9.3 just 14 hours prior.  Some aspect of this was thought to be possibly dilutional effect. BP also drifting down slightly.  Patient was transfused 1 unit of blood with repeat blood count 8.5, and subsequently 7.6 this a.m. Gastroenterology felt that if patient was still bleeding he still would have been passing blood rectally.  Recommending outpatient CBC check within the next 5 to 7 days to ensure blood counts improving.  Discharge instructions also included recommendation of iron supplementation to help build blood stores.   09/19/19 Patient reports 3 to 4 days of left lower quadrant abdominal pain.  The pain can be sharp at times.  There are no exacerbating or alleviating factors.  He points to right above the left inguinal canal.  There is no palpable bulge in that area.  Movement does not make it worse.  Resting does not make it better.  He has had some constipation.  He denies any melena or hematochezia.  He denies any nausea or vomiting.  He denies any fevers or chills.  He denies any reflux.  He denies any chest pain  or shortness of breath.  He denies any right lower quadrant pain.  He denies any right upper quadrant pain.  He is taking his proton pump inhibitor.  He denies any weight loss.  He denies any dysuria or hematuria or urgency or frequency  Past Medical History:  Diagnosis Date  . Arthritis   . Brachial neuritis   . Gastric ulcer   . GI bleed   . H pylori ulcer   . Hypercholesteremia   . Turner syndrome    left arm, some numbness in fingers   Past Surgical History:  Procedure Laterality Date  . COLONOSCOPY WITH PROPOFOL N/A 03/05/2015   Procedure: COLONOSCOPY WITH PROPOFOL;  Surgeon: Garlan Fair, MD;  Location: [facility];  Service: Endoscopy;  Laterality: N/A;  . ESOPHAGOGASTRODUODENOSCOPY  09/08/2011   Procedure: ESOPHAGOGASTRODUODENOSCOPY (EGD);  Surgeon: Winfield Cunas., MD;  Location: [facility];  Service: Endoscopy;  Laterality: N/A;  . HERNIA REPAIR    . INGUINAL HERNIA REPAIR Right 08/30/2013   Procedure: HERNIA REPAIR INGUINAL WITH MESH  ADULT;  Surgeon: Pedro Earls, MD;  Location: [facility];  Service: General;  Laterality: Right;  . SHOULDER ARTHROSCOPY  2011   x4  one open rt, rotator cuff tear, Dr. Amedeo Plenty  . TOTAL SHOULDER ARTHROPLASTY  01/30/2011   Procedure: TOTAL SHOULDER ARTHROPLASTY;  Surgeon: Augustin Schooling;  Location: [place];  Service: Orthopedics;  Laterality: Right;  right total shoulder  atrhoplasty    Allergies  Allergen Reactions  . Monosodium Glutamate Swelling    Rash, throat swells  . Tylox [Oxycodone-Acetaminophen]     Pt states he had a "weird feeling"  . Latex Rash   Social History   Socioeconomic History  . Marital status: Married    Spouse name: Not on file  . Number of children: Not on file  . Years of education: Not on file  . Highest education level: Not on file  Occupational History  . Not on file  Tobacco Use  . Smoking status: Never Smoker  . Smokeless tobacco: Never Used  Vaping Use  . Vaping Use: Never used  Substance  and Sexual Activity  . Alcohol use: No  . Drug use: No  . Sexual activity: Not on file  Other Topics Concern  . Not on file  Social History Narrative  . Not on file   Social Determinants of Health   Financial Resource Strain:   . Difficulty of Paying Living Expenses:   Food Insecurity:   . Worried About Charity fundraiser in the Last Year:   . Arboriculturist in the Last Year:   Transportation Needs:   . Film/video editor (Medical):   Marland Kitchen Lack of Transportation (Non-Medical):   Physical Activity:   . Days of Exercise per Week:   . Minutes of Exercise per Session:   Stress:   . Feeling of Stress :   Social Connections:   . Frequency of Communication with Friends and Family:   . Frequency of Social Gatherings with Friends and Family:   . Attends Religious Services:   . Active Member of Clubs or Organizations:   . Attends Archivist Meetings:   Marland Kitchen Marital Status:   Intimate Partner Violence:   . Fear of Current or Ex-Partner:   . Emotionally Abused:   Marland Kitchen Physically Abused:   . Sexually Abused:       Review of Systems  All other systems reviewed and are negative.      Objective:   Physical Exam Constitutional:      General: He is not in acute distress.    Appearance: He is not ill-appearing, toxic-appearing or diaphoretic.  Cardiovascular:     Rate and Rhythm: Normal rate and regular rhythm.     Heart sounds: Normal heart sounds. No murmur heard.  No friction rub. No gallop.   Pulmonary:     Effort: Pulmonary effort is normal. No respiratory distress.     Breath sounds: Normal breath sounds. No stridor. No wheezing or rhonchi.  Abdominal:     General: Abdomen is flat. Bowel sounds are normal. There is no distension.     Palpations: Abdomen is soft.     Tenderness: There is abdominal tenderness in the left lower quadrant. There is no guarding.    Neurological:     Mental Status: He is alert.           Assessment & Plan:  LLQ abdominal  pain - Plan: ciprofloxacin (CIPRO) 500 MG tablet, metroNIDAZOLE (FLAGYL) 500 MG tablet, CBC with Differential/Platelet, COMPLETE METABOLIC PANEL WITH GFR, Lipase  Uncertain of the diagnosis however I am concerned he may be developing diverticulitis.  Treat the patient empirically with Cipro and Flagyl.  He will take both twice daily for 7 days.  Check CBC, CMP, and lipase as the patient also complains of some mid abdominal discomfort as well.  Proceed with a CT scan of the  abdomen if pain intensifies.

## 2019-09-20 LAB — COMPLETE METABOLIC PANEL WITH GFR
AG Ratio: 1.6 (calc) (ref 1.0–2.5)
ALT: 15 U/L (ref 9–46)
AST: 20 U/L (ref 10–35)
Albumin: 4.3 g/dL (ref 3.6–5.1)
Alkaline phosphatase (APISO): 62 U/L (ref 35–144)
BUN: 22 mg/dL (ref 7–25)
CO2: 30 mmol/L (ref 20–32)
Calcium: 9.5 mg/dL (ref 8.6–10.3)
Chloride: 102 mmol/L (ref 98–110)
Creat: 1.13 mg/dL (ref 0.70–1.18)
GFR, Est African American: 75 mL/min/{1.73_m2} (ref >=60)
GFR, Est Non African American: 65 mL/min/{1.73_m2} (ref >=60)
Globulin: 2.7 g/dL (calc) (ref 1.9–3.7)
Glucose, Bld: 111 mg/dL — ABNORMAL HIGH (ref 65–99)
Potassium: 4.6 mmol/L (ref 3.5–5.3)
Sodium: 139 mmol/L (ref 135–146)
Total Bilirubin: 0.7 mg/dL (ref 0.2–1.2)
Total Protein: 7 g/dL (ref 6.1–8.1)

## 2019-09-20 LAB — CBC WITH DIFFERENTIAL/PLATELET
Absolute Monocytes: 549 cells/uL (ref 200–950)
Basophils Absolute: 49 cells/uL (ref 0–200)
Basophils Relative: 0.8 %
Eosinophils Absolute: 189 cells/uL (ref 15–500)
Eosinophils Relative: 3.1 %
HCT: 43.3 % (ref 38.5–50.0)
Hemoglobin: 14.5 g/dL (ref 13.2–17.1)
Lymphs Abs: 1678 cells/uL (ref 850–3900)
MCH: 30.5 pg (ref 27.0–33.0)
MCHC: 33.5 g/dL (ref 32.0–36.0)
MCV: 91.2 fL (ref 80.0–100.0)
MPV: 11.4 fL (ref 7.5–12.5)
Monocytes Relative: 9 %
Neutro Abs: 3636 cells/uL (ref 1500–7800)
Neutrophils Relative %: 59.6 %
Platelets: 218 10*3/uL (ref 140–400)
RBC: 4.75 10*6/uL (ref 4.20–5.80)
RDW: 13 % (ref 11.0–15.0)
Total Lymphocyte: 27.5 %
WBC: 6.1 10*3/uL (ref 3.8–10.8)

## 2019-09-20 LAB — LIPASE: Lipase: 44 U/L (ref 7–60)

## 2019-09-28 DIAGNOSIS — H524 Presbyopia: Secondary | ICD-10-CM | POA: Diagnosis not present

## 2019-11-06 ENCOUNTER — Other Ambulatory Visit: Payer: Self-pay | Admitting: Family Medicine

## 2019-11-09 ENCOUNTER — Other Ambulatory Visit: Payer: Self-pay

## 2019-11-09 MED ORDER — TEMAZEPAM 30 MG PO CAPS: 30.0000 mg | ORAL_CAPSULE | Freq: Every day | ORAL | 1 refills | Status: DC

## 2019-11-16 ENCOUNTER — Telehealth: Payer: Self-pay | Admitting: Family Medicine

## 2019-11-16 NOTE — Telephone Encounter (Signed)
Pharmacy: [id number] - [place], [place] - [address]   Medication:tamsulosin (FLOMAX) 0.4 MG CAPS capsule   Qty:90  Sig: TAKE 1 CAPSULE BY MOUTH EVERY DAY  Physician: Dr. Dennard Schaumann

## 2020-01-22 DIAGNOSIS — L814 Other melanin hyperpigmentation: Secondary | ICD-10-CM | POA: Diagnosis not present

## 2020-01-22 DIAGNOSIS — L821 Other seborrheic keratosis: Secondary | ICD-10-CM | POA: Diagnosis not present

## 2020-01-22 DIAGNOSIS — D1801 Hemangioma of skin and subcutaneous tissue: Secondary | ICD-10-CM | POA: Diagnosis not present

## 2020-01-22 DIAGNOSIS — D225 Melanocytic nevi of trunk: Secondary | ICD-10-CM | POA: Diagnosis not present

## 2020-01-22 DIAGNOSIS — L570 Actinic keratosis: Secondary | ICD-10-CM | POA: Diagnosis not present

## 2020-01-22 DIAGNOSIS — Z85828 Personal history of other malignant neoplasm of skin: Secondary | ICD-10-CM | POA: Diagnosis not present

## 2020-02-21 ENCOUNTER — Other Ambulatory Visit: Payer: Self-pay | Admitting: Family Medicine

## 2020-02-24 IMAGING — CR DG FOOT COMPLETE 3+V*R*
3 series · 3 of 3 positions shown · non-contrast
Comparison: None.

CLINICAL DATA: Pain on the plantar surface of the right foot. Pain
around the right big toe.

EXAM:
RIGHT FOOT COMPLETE - 3+ VIEW

[x foot ap right]
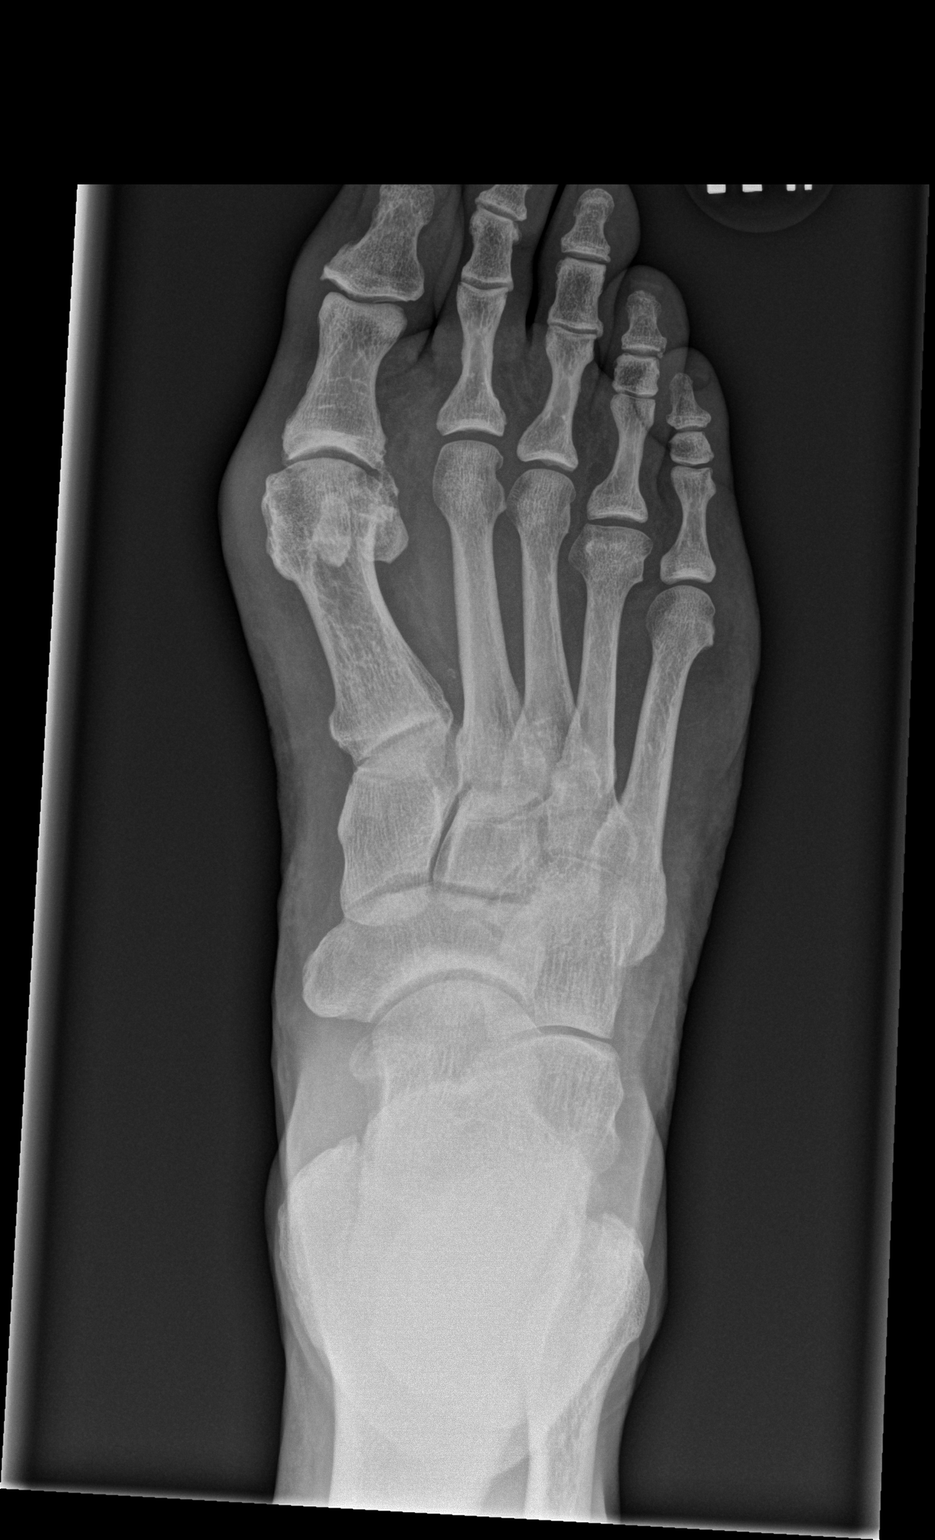

[x foot obl right]
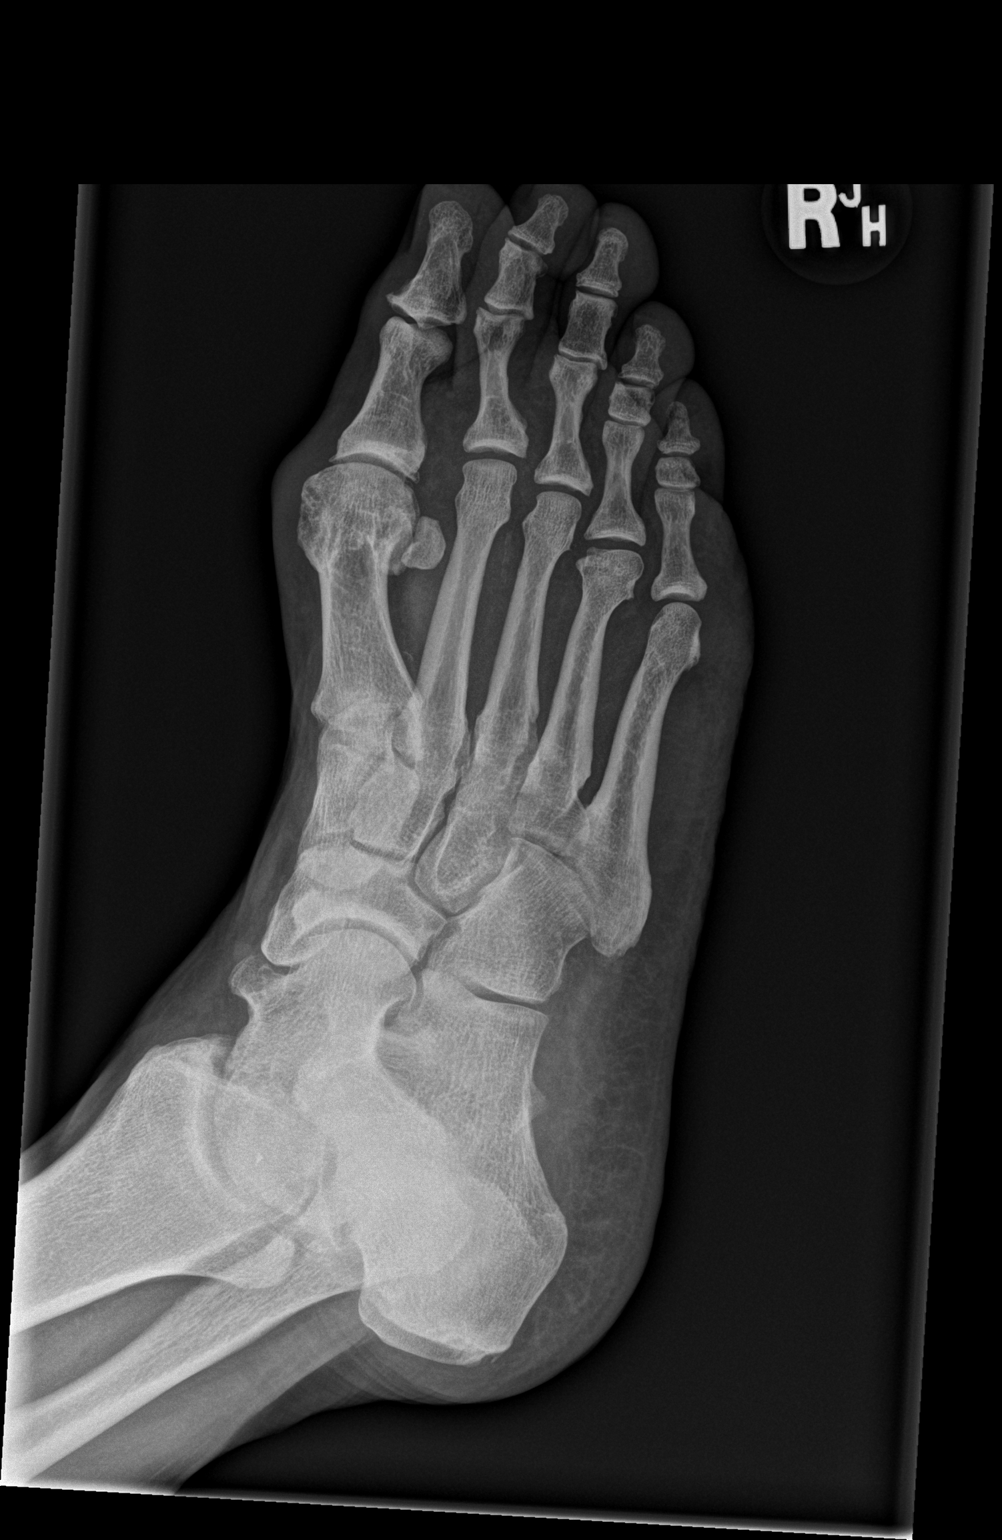

[x foot lat right]
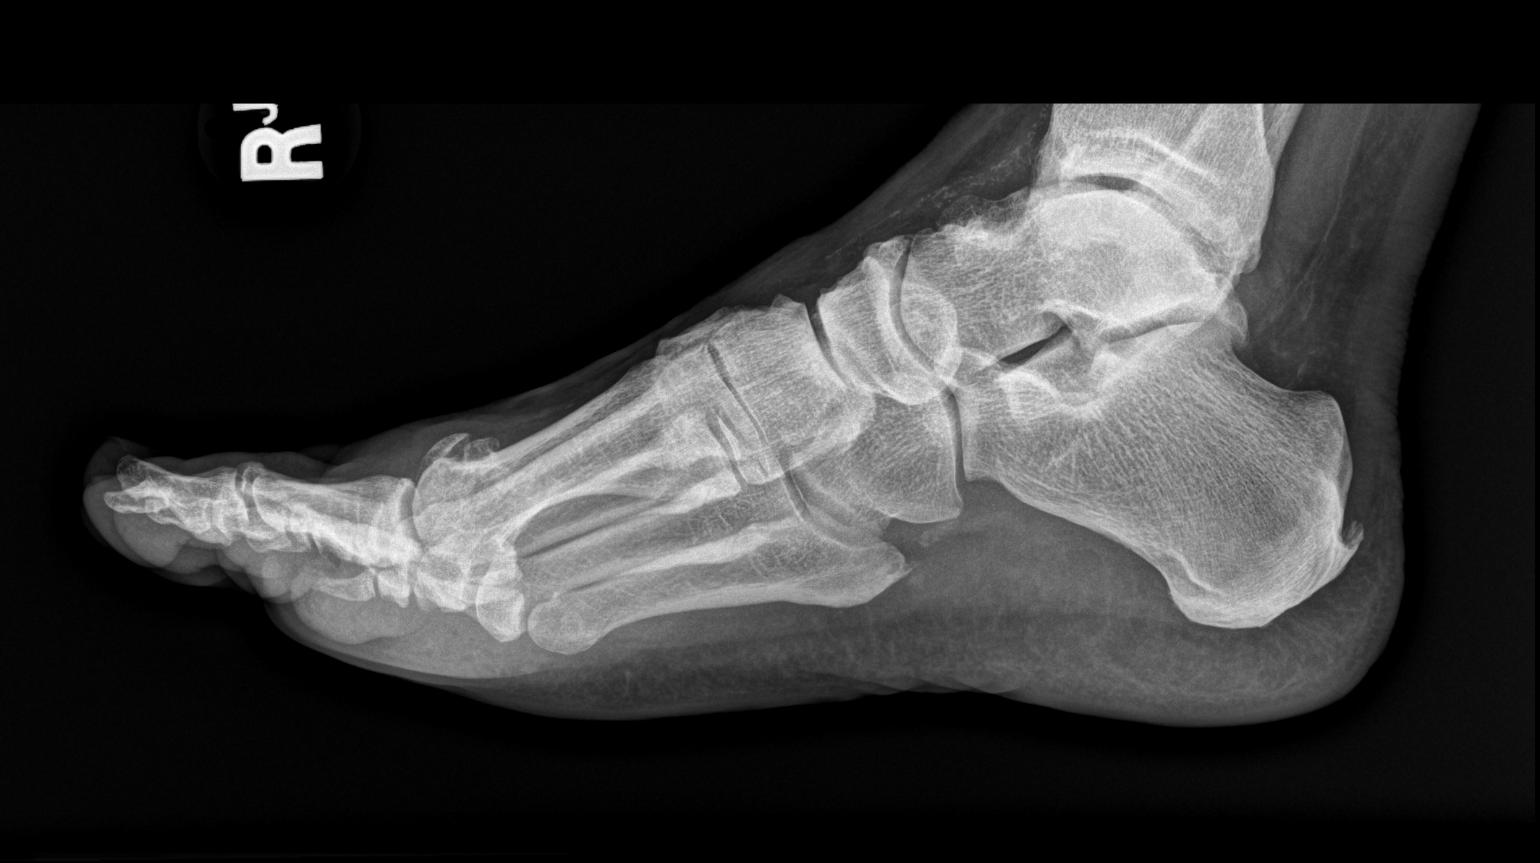

[3 of 3 positions shown; findings below may reference images not displayed]

FINDINGS: There is a mild hallux valgus deformity. Degenerative changes at the
first MTP joint. Negative for an acute fracture or dislocation.
Atherosclerotic calcifications. Calcaneal spurring at the Achilles
tendon insertion.
IMPRESSION: No acute bone abnormality to the right foot.

Degenerative changes at the first MTP joint.

## 2020-03-16 ENCOUNTER — Other Ambulatory Visit: Payer: Self-pay | Admitting: Family Medicine

## 2020-03-19 DIAGNOSIS — U071 COVID-19: Secondary | ICD-10-CM | POA: Diagnosis not present

## 2020-03-27 ENCOUNTER — Other Ambulatory Visit: Payer: Self-pay | Admitting: Family Medicine

## 2020-05-03 ENCOUNTER — Other Ambulatory Visit: Payer: Self-pay

## 2020-05-03 ENCOUNTER — Encounter: Payer: Self-pay | Admitting: Family Medicine

## 2020-05-03 ENCOUNTER — Ambulatory Visit (INDEPENDENT_AMBULATORY_CARE_PROVIDER_SITE_OTHER): Payer: Medicare HMO | Admitting: Family Medicine

## 2020-05-03 VITALS — BP 120/64 | HR 88 | Temp 98.1°F | Resp 14 | Ht 67.0 in | Wt 200.0 lb

## 2020-05-03 DIAGNOSIS — Z0001 Encounter for general adult medical examination with abnormal findings: Secondary | ICD-10-CM | POA: Diagnosis not present

## 2020-05-03 DIAGNOSIS — Z0000 Encounter for general adult medical examination without abnormal findings: Secondary | ICD-10-CM

## 2020-05-03 DIAGNOSIS — E7800 Pure hypercholesterolemia, unspecified: Secondary | ICD-10-CM | POA: Diagnosis not present

## 2020-05-03 DIAGNOSIS — R0600 Dyspnea, unspecified: Secondary | ICD-10-CM

## 2020-05-03 DIAGNOSIS — Z8719 Personal history of other diseases of the digestive system: Secondary | ICD-10-CM | POA: Diagnosis not present

## 2020-05-03 DIAGNOSIS — Z136 Encounter for screening for cardiovascular disorders: Secondary | ICD-10-CM | POA: Diagnosis not present

## 2020-05-03 DIAGNOSIS — Z13220 Encounter for screening for lipoid disorders: Secondary | ICD-10-CM | POA: Diagnosis not present

## 2020-05-03 DIAGNOSIS — Z125 Encounter for screening for malignant neoplasm of prostate: Secondary | ICD-10-CM | POA: Diagnosis not present

## 2020-05-03 DIAGNOSIS — R0609 Other forms of dyspnea: Secondary | ICD-10-CM

## 2020-05-03 MED ORDER — PRIMIDONE 50 MG PO TABS: 50.0000 mg | ORAL_TABLET | Freq: Every day | ORAL | 1 refills | Status: DC

## 2020-05-03 NOTE — Progress Notes (Signed)
Subjective:    Patient ID: Richard Beat., male    DOB: 1950/03/02, 70 y.o.   MRN: [id number]  HPI Patient is here today for complete physical exam.  His last colonoscopy was in 2017.  He is due for a PSA to screen for prostate cancer.  He is due for a booster on his COVID shot as well as Shingrix.  He did have COVID in February however and is uncertain of whether he wants a booster.  He has 2 concerns.  First he continues to be bothered by an essential tremor.  He states that sometimes when he is trying to focus and do fine motor movements, his hands will start to shake so bad that he is unable to do it.  For instance he gives the example of trying to eat soup with a spoon.  He frequently will spilled the soup.  He is now interested in trying medications such as primidone to see if it will help.  Also he reports dyspnea on exertion.  He states that this predates the Covid.  He denies any angina or chest pain.  He denies any orthopnea or paroxysmal nocturnal dyspnea.  However he states that he is becoming more easily winded doing activity. Past Medical History:  Diagnosis Date  . Arthritis   . Brachial neuritis   . Gastric ulcer   . GI bleed   . H pylori ulcer   . Hypercholesteremia   . Turner syndrome    left arm, some numbness in fingers   Past Surgical History:  Procedure Laterality Date  . COLONOSCOPY WITH PROPOFOL N/A 03/05/2015   Procedure: COLONOSCOPY WITH PROPOFOL;  Surgeon: Garlan Fair, MD;  Location: [facility];  Service: Endoscopy;  Laterality: N/A;  . ESOPHAGOGASTRODUODENOSCOPY  09/08/2011   Procedure: ESOPHAGOGASTRODUODENOSCOPY (EGD);  Surgeon: Winfield Cunas., MD;  Location: [facility];  Service: Endoscopy;  Laterality: N/A;  . HERNIA REPAIR    . INGUINAL HERNIA REPAIR Right 08/30/2013   Procedure: HERNIA REPAIR INGUINAL WITH MESH  ADULT;  Surgeon: Pedro Earls, MD;  Location: [facility];  Service: General;  Laterality: Right;  . SHOULDER ARTHROSCOPY  2011   x4   one open rt, rotator cuff tear, Dr. Amedeo Plenty  . TOTAL SHOULDER ARTHROPLASTY  01/30/2011   Procedure: TOTAL SHOULDER ARTHROPLASTY;  Surgeon: Augustin Schooling;  Location: [place];  Service: Orthopedics;  Laterality: Right;  right total shoulder atrhoplasty   Current Outpatient Medications on File Prior to Visit  Medication Sig Dispense Refill  . acetaminophen (TYLENOL) 650 MG CR tablet Take 650 mg by mouth every 8 (eight) hours as needed for pain.    Marland Kitchen atorvastatin (LIPITOR) 20 MG tablet TAKE 1 TABLET (20 MG TOTAL) BY MOUTH DAILY AT 6 PM. 90 tablet 3  . diphenhydrAMINE (BENADRYL) 25 MG tablet Take 25 mg by mouth 2 (two) times daily.    . fish oil-omega-3 fatty acids 1000 MG capsule Take 2 g by mouth daily.    . Multiple Vitamin (MULTIVITAMIN WITH MINERALS) TABS tablet Take 1 tablet by mouth daily.    . pantoprazole (PROTONIX) 40 MG tablet TAKE 1 TABLET BY MOUTH TWICE A DAY BEFORE A MEAL 180 tablet 1  . tamsulosin (FLOMAX) 0.4 MG CAPS capsule TAKE 1 CAPSULE BY MOUTH EVERY DAY 90 capsule 1  . temazepam (RESTORIL) 30 MG capsule Take 1 capsule (30 mg total) by mouth at bedtime. 90 capsule 1  . TURMERIC PO Take by mouth.    Marland Kitchen  vitamin C (ASCORBIC ACID) 500 MG tablet Take 500 mg by mouth daily.    . Zinc 50 MG TABS Take by mouth.     No current facility-administered medications on file prior to visit.   Allergies  Allergen Reactions  . Monosodium Glutamate Swelling    Rash, throat swells  . Tylox [Oxycodone-Acetaminophen]     Pt states he had a "weird feeling"  . Latex Rash   Social History   Socioeconomic History  . Marital status: Married    Spouse name: Not on file  . Number of children: Not on file  . Years of education: Not on file  . Highest education level: Not on file  Occupational History  . Not on file  Tobacco Use  . Smoking status: Never Smoker  . Smokeless tobacco: Never Used  Vaping Use  . Vaping Use: Never used  Substance and Sexual Activity  . Alcohol use: No  . Drug  use: No  . Sexual activity: Not on file  Other Topics Concern  . Not on file  Social History Narrative  . Not on file   Social Determinants of Health   Financial Resource Strain: Not on file  Food Insecurity: Not on file  Transportation Needs: Not on file  Physical Activity: Not on file  Stress: Not on file  Social Connections: Not on file  Intimate Partner Violence: Not on file   Family History  Problem Relation Age of Onset  . Heart disease Mother   . Heart disease Father    Family history is significant for a history of CABG in his father later in life   Review of Systems  All other systems reviewed and are negative.      Objective:   Physical Exam Vitals reviewed.  Constitutional:      General: He is not in acute distress.    Appearance: He is well-developed. He is not diaphoretic.  HENT:     Head: Normocephalic and atraumatic.     Right Ear: External ear normal.     Left Ear: External ear normal.     Nose: Nose normal.     Mouth/Throat:     Pharynx: No oropharyngeal exudate.  Eyes:     General: No scleral icterus.       Right eye: No discharge.        Left eye: No discharge.     Conjunctiva/sclera: Conjunctivae normal.     Pupils: Pupils are equal, round, and reactive to light.  Neck:     Thyroid: No thyromegaly.     Vascular: No JVD.     Trachea: No tracheal deviation.  Cardiovascular:     Rate and Rhythm: Normal rate and regular rhythm.     Heart sounds: Normal heart sounds. No murmur heard. No friction rub. No gallop.   Pulmonary:     Effort: Pulmonary effort is normal. No respiratory distress.     Breath sounds: Normal breath sounds. No stridor. No wheezing or rales.  Chest:     Chest wall: No tenderness.  Abdominal:     General: Bowel sounds are normal. There is no distension.     Palpations: Abdomen is soft. There is no mass.     Tenderness: There is no abdominal tenderness. There is no guarding or rebound.  Genitourinary:    Penis:  Normal.      Prostate: Normal.     Rectum: Normal.  Musculoskeletal:        General: No tenderness  or deformity. Normal range of motion.     Cervical back: Normal range of motion and neck supple.  Lymphadenopathy:     Cervical: No cervical adenopathy.  Skin:    General: Skin is warm.     Coloration: Skin is not pale.     Findings: No erythema or rash.  Neurological:     Mental Status: He is alert and oriented to person, place, and time.     Cranial Nerves: No cranial nerve deficit.     Motor: No abnormal muscle tone.     Coordination: Coordination normal.     Deep Tendon Reflexes: Reflexes are normal and symmetric.  Psychiatric:        Behavior: Behavior normal.        Thought Content: Thought content normal.        Judgment: Judgment normal.           Assessment & Plan:  DOE (dyspnea on exertion) - Plan: ECHOCARDIOGRAM COMPLETE, CBC with Differential/Platelet, COMPLETE METABOLIC PANEL WITH GFR, Lipid panel  Prostate cancer screening - Plan: PSA  Pure hypercholesterolemia  General medical exam  History of GI bleed  He denies any falls, depression, or memory loss.  His colonoscopy is up-to-date.  I will check a PSA to screen for prostate cancer.  I recommended a booster on his COVID vaccination and also recommended Shingrix.  I will check a CBC, CMP, and fasting lipid panel.  Goal LDL cholesterol is less than 100.  Given his dyspnea on exertion I will also check an echocardiogram to rule out congestive heart failure.  We will try primidone 50 mg p.o. daily for an essential tremor.  We will increase by 50 mg weekly until he is on 200 mg a day or the tremor improves.  Regular anticipatory guidance is provided.

## 2020-05-04 LAB — COMPLETE METABOLIC PANEL WITH GFR
AG Ratio: 1.8 (calc) (ref 1.0–2.5)
ALT: 21 U/L (ref 9–46)
AST: 24 U/L (ref 10–35)
Albumin: 4.4 g/dL (ref 3.6–5.1)
Alkaline phosphatase (APISO): 61 U/L (ref 35–144)
BUN: 21 mg/dL (ref 7–25)
CO2: 24 mmol/L (ref 20–32)
Calcium: 9.3 mg/dL (ref 8.6–10.3)
Chloride: 104 mmol/L (ref 98–110)
Creat: 1.04 mg/dL (ref 0.70–1.18)
GFR, Est African American: 83 mL/min/{1.73_m2} (ref >=60)
GFR, Est Non African American: 72 mL/min/{1.73_m2} (ref >=60)
Globulin: 2.5 g/dL (calc) (ref 1.9–3.7)
Glucose, Bld: 98 mg/dL (ref 65–99)
Potassium: 4.5 mmol/L (ref 3.5–5.3)
Sodium: 139 mmol/L (ref 135–146)
Total Bilirubin: 0.5 mg/dL (ref 0.2–1.2)
Total Protein: 6.9 g/dL (ref 6.1–8.1)

## 2020-05-04 LAB — CBC WITH DIFFERENTIAL/PLATELET
Absolute Monocytes: 684 cells/uL (ref 200–950)
Basophils Absolute: 48 cells/uL (ref 0–200)
Basophils Relative: 0.8 %
Eosinophils Absolute: 210 cells/uL (ref 15–500)
Eosinophils Relative: 3.5 %
HCT: 44 % (ref 38.5–50.0)
Hemoglobin: 14.4 g/dL (ref 13.2–17.1)
Lymphs Abs: 1704 cells/uL (ref 850–3900)
MCH: 30.3 pg (ref 27.0–33.0)
MCHC: 32.7 g/dL (ref 32.0–36.0)
MCV: 92.4 fL (ref 80.0–100.0)
MPV: 11 fL (ref 7.5–12.5)
Monocytes Relative: 11.4 %
Neutro Abs: 3354 cells/uL (ref 1500–7800)
Neutrophils Relative %: 55.9 %
Platelets: 240 10*3/uL (ref 140–400)
RBC: 4.76 10*6/uL (ref 4.20–5.80)
RDW: 13.1 % (ref 11.0–15.0)
Total Lymphocyte: 28.4 %
WBC: 6 10*3/uL (ref 3.8–10.8)

## 2020-05-04 LAB — LIPID PANEL
Cholesterol: 149 mg/dL (ref <200)
HDL: 47 mg/dL (ref >=40)
LDL Cholesterol (Calc): 83 mg/dL (calc)
Non-HDL Cholesterol (Calc): 102 mg/dL (calc) (ref <130)
Total CHOL/HDL Ratio: 3.2 (calc) (ref <5.0)
Triglycerides: 92 mg/dL (ref <150)

## 2020-05-04 LAB — PSA: PSA: 0.45 ng/mL (ref <=4.0)

## 2020-05-14 ENCOUNTER — Other Ambulatory Visit: Payer: Self-pay | Admitting: *Deleted

## 2020-05-14 ENCOUNTER — Telehealth: Payer: Self-pay | Admitting: *Deleted

## 2020-05-14 MED ORDER — TEMAZEPAM 30 MG PO CAPS: 30.0000 mg | ORAL_CAPSULE | Freq: Every day | ORAL | 1 refills | Status: DC

## 2020-05-14 NOTE — Telephone Encounter (Signed)
Received VM from patient.   Inquiring about referral for Echocardiogram.   Please call patient to discuss.

## 2020-05-14 NOTE — Telephone Encounter (Signed)
Received VM from patient.   Requested refill on Temazepam.   Ok to refill??  Last office visit 05/03/2020.  Last refill 11/09/2019, #1 refill.

## 2020-05-14 NOTE — Telephone Encounter (Signed)
Received another call from patient. States that he has been calling since Thursday of last week to have his prescription refilled and he is out of medication at this time.   Apologized for the delay. Advised PCP will review message once he returns to office.

## 2020-05-15 ENCOUNTER — Telehealth: Payer: Self-pay | Admitting: *Deleted

## 2020-05-15 NOTE — Telephone Encounter (Signed)
Received request from pharmacy for PA on Temazepam.   PA submitted.   Dx: G47.0- Insomnia  Received immediate determination.   PA approved 02/10/2020- 02/08/2021.

## 2020-05-25 ENCOUNTER — Other Ambulatory Visit: Payer: Self-pay | Admitting: Family Medicine

## 2020-05-27 ENCOUNTER — Telehealth: Payer: Self-pay | Admitting: *Deleted

## 2020-05-27 MED ORDER — PRIMIDONE 50 MG PO TABS: 100.0000 mg | ORAL_TABLET | Freq: Every day | ORAL | 1 refills | Status: DC

## 2020-05-27 NOTE — Telephone Encounter (Signed)
Call placed to patient and patient made aware.   Prescription sent to pharmacy for dose change.

## 2020-05-27 NOTE — Telephone Encounter (Signed)
Received call from patient.   Patient reports that he is taking Primidone 50mg  PO Q HS for tremor. States that he has experienced no side effects.   Plan from 05/03/2020: We will try primidone 50 mg p.o. daily for an essential tremor.  We will increase by 50 mg weekly until he is on 200 mg a day or the tremor improves.   Also states that he had difficulty obtaining Restoril at last refill due to refill approvals and prior authorization. States that since he was off of medication x4 days, he stopped it altogether.

## 2020-05-27 NOTE — Telephone Encounter (Signed)
I would increase primidone to 100 mg poqhs. Ok to stop restoril.

## 2020-06-12 ENCOUNTER — Ambulatory Visit (HOSPITAL_COMMUNITY): Payer: Medicare HMO | Attending: Cardiology

## 2020-06-12 ENCOUNTER — Other Ambulatory Visit: Payer: Self-pay

## 2020-06-12 DIAGNOSIS — R0609 Other forms of dyspnea: Secondary | ICD-10-CM

## 2020-06-12 DIAGNOSIS — R0600 Dyspnea, unspecified: Secondary | ICD-10-CM | POA: Insufficient documentation

## 2020-06-12 LAB — ECHOCARDIOGRAM COMPLETE: Area-P 1/2: 4.36 cm2

## 2020-06-12 MED ORDER — PERFLUTREN LIPID MICROSPHERE
1.0000 mL | INTRAVENOUS | Status: AC | PRN
  Administered 2020-06-12: 3 mL via INTRAVENOUS

## 2020-06-13 ENCOUNTER — Other Ambulatory Visit: Payer: Self-pay

## 2020-06-13 ENCOUNTER — Telehealth: Payer: Self-pay

## 2020-06-13 DIAGNOSIS — R0600 Dyspnea, unspecified: Secondary | ICD-10-CM

## 2020-06-13 DIAGNOSIS — I5189 Other ill-defined heart diseases: Secondary | ICD-10-CM

## 2020-06-13 DIAGNOSIS — R0609 Other forms of dyspnea: Secondary | ICD-10-CM

## 2020-06-13 NOTE — Telephone Encounter (Signed)
Pt called to get results from Echo. Agreed with the recommendation of going to cardiology

## 2020-08-13 NOTE — Progress Notes (Signed)
Cardiology Office Note:    Date:  08/14/2020   ID:  [name], DOB 04/30/1950, MRN [id number]  PCP:  Susy Frizzle, MD   [facility] Providers Cardiologist:  Werner Lean, MD     Referring MD: Susy Frizzle, MD   CC: Discussing better health Consulted for the evaluation of DOE at the behest of Susy Frizzle, MD  History of Present Illness:    Richard Arnold. is a 70 y.o. male with a hx of HLD who presents for evaluation 08/14/20.  Patient notes that he is feeling out of breath.  Has had no chest pain, chest pressure, chest tightness, chest stinging.  Has a strange exertional chest discomfort in his sternum with exertion but feels none of the above adequately characterizes it.Discomfort occurs with DOE in the yard, worsens with exertion, and improves with rest.  Patient exertion notable for going up stairs with and feels like he is breathing hard.  No shortness of breath at rest. Generalized fatigue No PND or orthopnea.  No bendopnea, weight gain, leg swelling , or abdominal swelling.  No syncope or near syncope. Notes  no palpitations or funny heart beats.     Chart Review hx of GI bleed: patient notes he has had no issues with this in years and is on ASA without issue.  Past Medical History:  Diagnosis Date   Arthritis    Brachial neuritis    Gastric ulcer    GI bleed    H pylori ulcer    Hypercholesteremia    Turner syndrome    left arm, some numbness in fingers    Past Surgical History:  Procedure Laterality Date   COLONOSCOPY WITH PROPOFOL N/A 03/05/2015   Procedure: COLONOSCOPY WITH PROPOFOL;  Surgeon: Garlan Fair, MD;  Location: [facility];  Service: Endoscopy;  Laterality: N/A;   ESOPHAGOGASTRODUODENOSCOPY  09/08/2011   Procedure: ESOPHAGOGASTRODUODENOSCOPY (EGD);  Surgeon: Winfield Cunas., MD;  Location: [facility];  Service: Endoscopy;  Laterality: N/A;   HERNIA REPAIR     INGUINAL HERNIA REPAIR Right 08/30/2013    Procedure: HERNIA REPAIR INGUINAL WITH MESH  ADULT;  Surgeon: Pedro Earls, MD;  Location: [facility];  Service: General;  Laterality: Right;   SHOULDER ARTHROSCOPY  2011   x4  one open rt, rotator cuff tear, Dr. Amedeo Plenty   TOTAL SHOULDER ARTHROPLASTY  01/30/2011   Procedure: TOTAL SHOULDER ARTHROPLASTY;  Surgeon: Augustin Schooling;  Location: [facility];  Service: Orthopedics;  Laterality: Right;  right total shoulder atrhoplasty    Current Medications: Current Meds  Medication Sig   acetaminophen (TYLENOL) 650 MG CR tablet Take 650 mg by mouth every 8 (eight) hours as needed for pain.   aspirin EC 81 MG tablet Take 81 mg by mouth daily. Swallow whole.   atorvastatin (LIPITOR) 40 MG tablet Take 1 tablet (40 mg total) by mouth daily.   diphenhydrAMINE (BENADRYL) 25 MG tablet Take 25 mg by mouth 2 (two) times daily.   fish oil-omega-3 fatty acids 1000 MG capsule Take 2 g by mouth daily.   metoprolol tartrate (LOPRESSOR) 25 MG tablet Take 0.5 tablets (12.5 mg total) by mouth 2 (two) times daily.   Multiple Vitamin (MULTIVITAMIN WITH MINERALS) TABS tablet Take 1 tablet by mouth daily.   pantoprazole (PROTONIX) 40 MG tablet TAKE 1 TABLET BY MOUTH TWICE A DAY BEFORE A MEAL   primidone (MYSOLINE) 50 MG tablet Take 2 tablets (100 mg total) by mouth  at bedtime.   tamsulosin (FLOMAX) 0.4 MG CAPS capsule TAKE 1 CAPSULE BY MOUTH EVERY DAY   TURMERIC PO Take by mouth.   vitamin C (ASCORBIC ACID) 500 MG tablet Take 500 mg by mouth daily.   Zinc 50 MG TABS Take by mouth.   [DISCONTINUED] atorvastatin (LIPITOR) 20 MG tablet TAKE 1 TABLET (20 MG TOTAL) BY MOUTH DAILY AT 6 PM.     Allergies:   Monosodium glutamate, Tylox [oxycodone-acetaminophen], and Latex   Social History   Socioeconomic History   Marital status: Married    Spouse name: Not on file   Number of children: Not on file   Years of education: Not on file   Highest education level: Not on file  Occupational History   Not on file  Tobacco Use    Smoking status: Never   Smokeless tobacco: Never  Vaping Use   Vaping Use: Never used  Substance and Sexual Activity   Alcohol use: No   Drug use: No   Sexual activity: Not on file  Other Topics Concern   Not on file  Social History Narrative   Not on file   Social Determinants of Health   Financial Resource Strain: Not on file  Food Insecurity: Not on file  Transportation Needs: Not on file  Physical Activity: Not on file  Stress: Not on file  Social Connections: Not on file    Social: Comes with Wife, from [place]  Family History: The patient's family history includes Heart disease in his father and mother. History of coronary artery disease notable for father with multiple bypass surgeries, mother, uncle, and grandfather.  ROS:   Please see the history of present illness.    All other systems reviewed and are negative.  EKGs/Labs/Other Studies Reviewed:    The following studies were reviewed today:  EKG:  EKG is  ordered today.  The ekg ordered today demonstrates  08/14/20: SR rate 76 WNL  Transthoracic Echocardiogram Date:06/12/20 Results: 1. Left ventricular ejection fraction, by estimation, is 50 to 55%. The  left ventricle has low normal function. The left ventricle has no regional  wall motion abnormalities. Definity contrast agent was given IV to  delineate the left ventricular  endocardial borders. The left ventricular internal cavity size was normal  in size. There is no left ventricular hypertrophy. Left ventricular  diastolic parameters are consistent with Grade I diastolic dysfunction  (impaired relaxation).  2. The right ventricular size is normal. No increase in right ventricular  wall thickness. Right ventricular systolic function is normal. There is  normal pulmonary artery systolic pressure. The tricuspid regurgitant  velocity is 2.29 m/s, and with an  assumed right atrial pressure of 8 mmHg, the estimated right ventricular  systolic pressure is  38.4 mmHg.  3. The mitral valve is abnormal. Mild to moderate mitral annular  calcification. Trivial mitral valve regurgitation. No evidence of mitral  valve stenosis.  4. The aortic valve was not well visualized. Aortic valve regurgitation is  not visualized. No aortic stenosis is present.  5. Prominent Chiari network seen in the subcostal.   NonCardiac CT: Date:12/16/2006 Results: LM, LAD CAC and Aortic Atherosclerosis  Recent Labs: 05/03/2020: ALT 21; BUN 21; Creat 1.04; Hemoglobin 14.4; Platelets 240; Potassium 4.5; Sodium 139  Recent Lipid Panel    Component Value Date/Time   CHOL 149 05/03/2020 1012   TRIG 92 05/03/2020 1012   HDL 47 05/03/2020 1012   CHOLHDL 3.2 05/03/2020 1012   LDLCALC 83 05/03/2020 1012  Risk Assessment/Calculations:     N/A      Physical Exam:    VS:  BP 130/70   Pulse 76   Ht 5\' 7"  (1.702 m)   Wt 88 kg   SpO2 95%   BMI 30.38 kg/m     Wt Readings from Last 3 Encounters:  08/14/20 88 kg  05/03/20 90.7 kg  09/19/19 90.7 kg     GEN:  Well nourished, well developed in no acute distress HEENT: R sided Frank's Sign NECK: No JVD; No carotid bruits LYMPHATICS: No lymphadenopathy CARDIAC: RRR, no murmurs, rubs, gallops RESPIRATORY:  Clear to auscultation without rales, wheezing or rhonchi  ABDOMEN: Soft, non-tender, non-distended MUSCULOSKELETAL:  No edema; No deformity  SKIN: Warm and dry NEUROLOGIC:  Alert and oriented x 3; essential tremor in left arm (fingers) and and left foot PSYCHIATRIC:  Normal affect   ASSESSMENT:    1. DOE (dyspnea on exertion)   2. Aortic atherosclerosis ([place])   3. Hypercholesteremia   4. Essential tremor    PLAN:    DOE and funny feeling in sternum HLD Aortic atherosclerosis and CAC Essential Tremor (mild left side only) - Reviewed CT with patient and wife; has typical exertional symptoms and high prox LAD calcium burden; concern that NM Stress would be false positive given data we presently have;  will proceed to [facility] - Risks and benefits of cardiac catheterization have been discussed with the patient.  These include bleeding, infection, kidney damage, stroke, heart attack, death.  The patient understands these risks and is willing to proceed.  - will stat ASA 81 mg PO Daily - will increase statin to 40 mg PO Daily Will start metoprolol 12.5 mg PO BID  Shared Decision Making/Informed Consent The risks [stroke (1 in 1000), death (1 in 1000), kidney failure [usually temporary] (1 in 500), bleeding (1 in 200), allergic reaction [possibly serious] (1 in 200)], benefits (diagnostic support and management of coronary artery disease) and alternatives of a cardiac catheterization were discussed in detail with Richard Arnold and he is willing to proceed.    Medication Adjustments/Labs and Tests Ordered: Current medicines are reviewed at length with the patient today.  Concerns regarding medicines are outlined above.  Orders Placed This Encounter  Procedures   CBC   Basic metabolic panel   EKG 24-OXBD    Meds ordered this encounter  Medications   metoprolol tartrate (LOPRESSOR) 25 MG tablet    Sig: Take 0.5 tablets (12.5 mg total) by mouth 2 (two) times daily.    Dispense:  90 tablet    Refill:  3   atorvastatin (LIPITOR) 40 MG tablet    Sig: Take 1 tablet (40 mg total) by mouth daily.    Dispense:  90 tablet    Refill:  3     Patient Instructions  Medication Instructions:  Your physician has recommended you make the following change in your medication:  START: metoprolol tartrate (Lopressor) 12.5 mg by mouth twice daily INCREASE: atorvastatin (Lipitor) to 40 mg by mouth daily   *If you need a refill on your cardiac medications before your next appointment, please call your pharmacy*   Lab Work: TODAY: CBC and BMET If you have labs (blood work) drawn today and your tests are completely normal, you will receive your results only by: [facility] (if you have MyChart) OR A paper  copy in the mail If you have any lab test that is abnormal or we need to change your treatment, we will  call you to review the results.   Testing/Procedures: Your physician has requested that you have a heart cath.    Follow-Up: At [facility], you and your health needs are our priority.  As part of our continuing mission to provide you with exceptional heart care, we have created designated Provider Care Teams.  These Care Teams include your primary Cardiologist (physician) and Advanced Practice Providers (APPs -  Physician Assistants and Nurse Practitioners) who all work together to provide you with the care you need, when you need it.  We recommend signing up for the patient portal called "MyChart".  Sign up information is provided on this After Visit Summary.  MyChart is used to connect with patients for Virtual Visits (Telemedicine).  Patients are able to view lab/test results, encounter notes, upcoming appointments, etc.  Non-urgent messages can be sent to your provider as well.   To learn more about what you can do with MyChart, go to [email or web address].    Your next appointment:   1 month(s)  The format for your next appointment:   In Person  Provider:   You may see Werner Lean, MD or one of the following Advanced Practice Providers on your designated Care Team:   Melina Copa, PA-C Ermalinda Barrios, PA-C   Other Instructions    [place] OFFICE [place], [place] [address] Dept: [phone] Loc: [place].  08/14/2020  You are scheduled for a Cardiac Catheterization on Monday, July 11 with Dr. Peter Martinique.  1. Please arrive at the [facility] (Main Entrance A) at [facility]: [address] [place], [place] [address] at 8:30 AM (This time is two hours before your procedure to ensure your preparation). Free valet parking service  is available.   Special note: Every effort is made to have your procedure done on time. Please understand that emergencies sometimes delay scheduled procedures.  2. Diet: Do not eat solid foods after midnight.  The patient may have clear liquids until 5am upon the day of the procedure.  3. Labs: You will need to have blood drawn Today  4. Medication instructions in preparation for your procedure:   Contrast Allergy: No    On the morning of your procedure, take your Aspirin 81 mg  and any morning medicines NOT listed above.  You may use sips of water.  5. Plan for one night stay--bring personal belongings. 6. Bring a current list of your medications and current insurance cards. 7. You MUST have a responsible person to drive you home. 8. Someone MUST be with you the first 24 hours after you arrive home or your discharge will be delayed. 9. Please wear clothes that are easy to get on and off and wear slip-on shoes.  Thank you for allowing [place] to care for you!   -- [facility] Invasive Cardiovascular services     Signed, Werner Lean, MD  08/14/2020 11:19 AM    Richard Arnold

## 2020-08-13 NOTE — H&P (View-Only) (Signed)
Cardiology Office Note:    Date:  08/14/2020   ID:  Richard Beat., DOB [birth year], MRN [id number]  PCP:  Richard Frizzle, MD   [facility] Providers Cardiologist:  Richard Lean, MD     Referring MD: Richard Frizzle, MD   CC: Discussing better health Consulted for the evaluation of DOE at the behest of Richard Frizzle, MD  History of Present Illness:    Richard Schueler. is a 70 y.o. male with a hx of HLD who presents for evaluation 08/14/20.  Patient notes that he is feeling out of breath.  Has had no chest pain, chest pressure, chest tightness, chest stinging.  Has a strange exertional chest discomfort in his sternum with exertion but feels none of the above adequately characterizes it.Discomfort occurs with DOE in the yard, worsens with exertion, and improves with rest.  Patient exertion notable for going up stairs with and feels like he is breathing hard.  No shortness of breath at rest. Generalized fatigue No PND or orthopnea.  No bendopnea, weight gain, leg swelling , or abdominal swelling.  No syncope or near syncope. Notes  no palpitations or funny heart beats.     Chart Review hx of GI bleed: patient notes he has had no issues with this in years and is on ASA without issue.  Past Medical History:  Diagnosis Date   Arthritis    Brachial neuritis    Gastric ulcer    GI bleed    H pylori ulcer    Hypercholesteremia    Turner syndrome    left arm, some numbness in fingers    Past Surgical History:  Procedure Laterality Date   COLONOSCOPY WITH PROPOFOL N/A 03/05/2015   Procedure: COLONOSCOPY WITH PROPOFOL;  Surgeon: Richard Fair, MD;  Location: [facility];  Service: Endoscopy;  Laterality: N/A;   ESOPHAGOGASTRODUODENOSCOPY  09/08/2011   Procedure: ESOPHAGOGASTRODUODENOSCOPY (EGD);  Surgeon: Richard Cunas., MD;  Location: [facility];  Service: Endoscopy;  Laterality: N/A;   HERNIA REPAIR     INGUINAL HERNIA REPAIR Right 08/30/2013    Procedure: HERNIA REPAIR INGUINAL WITH MESH  ADULT;  Surgeon: Richard Earls, MD;  Location: [facility];  Service: General;  Laterality: Right;   SHOULDER ARTHROSCOPY  2011   x4  one open rt, rotator cuff tear, Dr. Amedeo Arnold   TOTAL SHOULDER ARTHROPLASTY  01/30/2011   Procedure: TOTAL SHOULDER ARTHROPLASTY;  Surgeon: Richard Arnold;  Location: [place];  Service: Orthopedics;  Laterality: Right;  right total shoulder atrhoplasty    Current Medications: Current Meds  Medication Sig   acetaminophen (TYLENOL) 650 MG CR tablet Take 650 mg by mouth every 8 (eight) hours as needed for pain.   aspirin EC 81 MG tablet Take 81 mg by mouth daily. Swallow whole.   atorvastatin (LIPITOR) 40 MG tablet Take 1 tablet (40 mg total) by mouth daily.   diphenhydrAMINE (BENADRYL) 25 MG tablet Take 25 mg by mouth 2 (two) times daily.   fish oil-omega-3 fatty acids 1000 MG capsule Take 2 g by mouth daily.   metoprolol tartrate (LOPRESSOR) 25 MG tablet Take 0.5 tablets (12.5 mg total) by mouth 2 (two) times daily.   Multiple Vitamin (MULTIVITAMIN WITH MINERALS) TABS tablet Take 1 tablet by mouth daily.   pantoprazole (PROTONIX) 40 MG tablet TAKE 1 TABLET BY MOUTH TWICE A DAY BEFORE A MEAL   primidone (MYSOLINE) 50 MG tablet Take 2 tablets (100 mg total) by mouth  at bedtime.   tamsulosin (FLOMAX) 0.4 MG CAPS capsule TAKE 1 CAPSULE BY MOUTH EVERY DAY   TURMERIC PO Take by mouth.   vitamin C (ASCORBIC ACID) 500 MG tablet Take 500 mg by mouth daily.   Zinc 50 MG TABS Take by mouth.   [DISCONTINUED] atorvastatin (LIPITOR) 20 MG tablet TAKE 1 TABLET (20 MG TOTAL) BY MOUTH DAILY AT 6 PM.     Allergies:   Monosodium glutamate, Tylox [oxycodone-acetaminophen], and Latex   Social History   Socioeconomic History   Marital status: Married    Spouse name: Not on file   Number of children: Not on file   Years of education: Not on file   Highest education level: Not on file  Occupational History   Not on file  Tobacco Use    Smoking status: Never   Smokeless tobacco: Never  Vaping Use   Vaping Use: Never used  Substance and Sexual Activity   Alcohol use: No   Drug use: No   Sexual activity: Not on file  Other Topics Concern   Not on file  Social History Narrative   Not on file   Social Determinants of Health   Financial Resource Strain: Not on file  Food Insecurity: Not on file  Transportation Needs: Not on file  Physical Activity: Not on file  Stress: Not on file  Social Connections: Not on file    Social: Comes with Wife, from [place]  Family History: The patient's family history includes Heart disease in his father and mother. History of coronary artery disease notable for father with multiple bypass surgeries, mother, uncle, and grandfather.  ROS:   Please see the history of present illness.    All other systems reviewed and are negative.  EKGs/Labs/Other Studies Reviewed:    The following studies were reviewed today:  EKG:  EKG is  ordered today.  The ekg ordered today demonstrates  08/14/20: SR rate 76 WNL  Transthoracic Echocardiogram Date:06/12/20 Results: 1. Left ventricular ejection fraction, by estimation, is 50 to 55%. The  left ventricle has low normal function. The left ventricle has no regional  wall motion abnormalities. Definity contrast agent was given IV to  delineate the left ventricular  endocardial borders. The left ventricular internal cavity size was normal  in size. There is no left ventricular hypertrophy. Left ventricular  diastolic parameters are consistent with Grade I diastolic dysfunction  (impaired relaxation).  2. The right ventricular size is normal. No increase in right ventricular  wall thickness. Right ventricular systolic function is normal. There is  normal pulmonary artery systolic pressure. The tricuspid regurgitant  velocity is 2.29 m/s, and with an  assumed right atrial pressure of 8 mmHg, the estimated right ventricular  systolic pressure is  09.8 mmHg.  3. The mitral valve is abnormal. Mild to moderate mitral annular  calcification. Trivial mitral valve regurgitation. No evidence of mitral  valve stenosis.  4. The aortic valve was not well visualized. Aortic valve regurgitation is  not visualized. No aortic stenosis is present.  5. Prominent Chiari network seen in the subcostal.   NonCardiac CT: Date:12/16/2006 Results: LM, LAD CAC and Aortic Atherosclerosis  Recent Labs: 05/03/2020: ALT 21; BUN 21; Creat 1.04; Hemoglobin 14.4; Platelets 240; Potassium 4.5; Sodium 139  Recent Lipid Panel    Component Value Date/Time   CHOL 149 05/03/2020 1012   TRIG 92 05/03/2020 1012   HDL 47 05/03/2020 1012   CHOLHDL 3.2 05/03/2020 1012   LDLCALC 83 05/03/2020 1012  Risk Assessment/Calculations:     N/A      Physical Exam:    VS:  BP 130/70   Pulse 76   Ht 5\' 7"  (1.702 m)   Wt 88 kg   SpO2 95%   BMI 30.38 kg/m     Wt Readings from Last 3 Encounters:  08/14/20 88 kg  05/03/20 90.7 kg  09/19/19 90.7 kg     GEN:  Well nourished, well developed in no acute distress HEENT: R sided Frank[place] Sign NECK: No JVD; No carotid bruits LYMPHATICS: No lymphadenopathy CARDIAC: RRR, no murmurs, rubs, gallops RESPIRATORY:  Clear to auscultation without rales, wheezing or rhonchi  ABDOMEN: Soft, non-tender, non-distended MUSCULOSKELETAL:  No edema; No deformity  SKIN: Warm and dry NEUROLOGIC:  Alert and oriented x 3; essential tremor in left arm (fingers) and and left foot PSYCHIATRIC:  Normal affect   ASSESSMENT:    1. DOE (dyspnea on exertion)   2. Aortic atherosclerosis ([place])   3. Hypercholesteremia   4. Essential tremor    PLAN:    DOE and funny feeling in sternum HLD Aortic atherosclerosis and CAC Essential Tremor (mild left side only) - Reviewed CT with patient and wife; has typical exertional symptoms and high prox LAD calcium burden; concern that NM Stress would be false positive given data we presently have;  will proceed to [facility] - Risks and benefits of cardiac catheterization have been discussed with the patient.  These include bleeding, infection, kidney damage, stroke, heart attack, death.  The patient understands these risks and is willing to proceed.  - will stat ASA 81 mg PO Daily - will increase statin to 40 mg PO Daily Will start metoprolol 12.5 mg PO BID  Shared Decision Making/Informed Consent The risks [stroke (1 in 1000), death (1 in 1000), kidney failure [usually temporary] (1 in 500), bleeding (1 in 200), allergic reaction [possibly serious] (1 in 200)], benefits (diagnostic support and management of coronary artery disease) and alternatives of a cardiac catheterization were discussed in detail with Richard Arnold and he is willing to proceed.    Medication Adjustments/Labs and Tests Ordered: Current medicines are reviewed at length with the patient today.  Concerns regarding medicines are outlined above.  Orders Placed This Encounter  Procedures   CBC   Basic metabolic panel   EKG [id number]    Meds ordered this encounter  Medications   metoprolol tartrate (LOPRESSOR) 25 MG tablet    Sig: Take 0.5 tablets (12.5 mg total) by mouth 2 (two) times daily.    Dispense:  90 tablet    Refill:  3   atorvastatin (LIPITOR) 40 MG tablet    Sig: Take 1 tablet (40 mg total) by mouth daily.    Dispense:  90 tablet    Refill:  3     Patient Instructions  Medication Instructions:  Your physician has recommended you make the following change in your medication:  START: metoprolol tartrate (Lopressor) 12.5 mg by mouth twice daily INCREASE: atorvastatin (Lipitor) to 40 mg by mouth daily   *If you need a refill on your cardiac medications before your next appointment, please call your pharmacy*   Lab Work: TODAY: CBC and BMET If you have labs (blood work) drawn today and your tests are completely normal, you will receive your results only by: [place] (if you have MyChart) OR A paper  copy in the mail If you have any lab test that is abnormal or we need to change your treatment, we will  call you to review the results.   Testing/Procedures: Your physician has requested that you have a heart cath.    Follow-Up: At [facility], you and your health needs are our priority.  As part of our continuing mission to provide you with exceptional heart care, we have created designated Provider Care Teams.  These Care Teams include your primary Cardiologist (physician) and Advanced Practice Providers (APPs -  Physician Assistants and Nurse Practitioners) who all work together to provide you with the care you need, when you need it.  We recommend signing up for the patient portal called "MyChart".  Sign up information is provided on this After Visit Summary.  MyChart is used to connect with patients for Virtual Visits (Telemedicine).  Patients are able to view lab/test results, encounter notes, upcoming appointments, etc.  Non-urgent messages can be sent to your provider as well.   To learn more about what you can do with MyChart, go to [email or web address].    Your next appointment:   1 month(s)  The format for your next appointment:   In Person  Provider:   You may see Richard Lean, MD or one of the following Advanced Practice Providers on your designated Care Team:   Melina Copa, PA-C Ermalinda Barrios, PA-C   Other Instructions    [place] OFFICE [place], [place] [address] Dept: [phone] Loc: [place].  08/14/2020  You are scheduled for a Cardiac Catheterization on Monday, July 11 with Dr. Peter Arnold.  1. Please arrive at the [facility] (Main Entrance A) at [facility]: [address] [place], [place] [address] at 8:30 AM (This time is two hours before your procedure to ensure your preparation). Free valet parking service  is available.   Special note: Every effort is made to have your procedure done on time. Please understand that emergencies sometimes delay scheduled procedures.  2. Diet: Do not eat solid foods after midnight.  The patient may have clear liquids until 5am upon the day of the procedure.  3. Labs: You will need to have blood drawn Today  4. Medication instructions in preparation for your procedure:   Contrast Allergy: No    On the morning of your procedure, take your Aspirin 81 mg  and any morning medicines NOT listed above.  You may use sips of water.  5. Plan for one night stay--bring personal belongings. 6. Bring a current list of your medications and current insurance cards. 7. You MUST have a responsible person to drive you home. 8. Someone MUST be with you the first 24 hours after you arrive home or your discharge will be delayed. 9. Please wear clothes that are easy to get on and off and wear slip-on shoes.  Thank you for allowing [place] to care for you!   -- [facility] Invasive Cardiovascular services     Signed, Richard Lean, MD  08/14/2020 11:19 AM    [place]

## 2020-08-14 ENCOUNTER — Encounter: Payer: Self-pay | Admitting: Internal Medicine

## 2020-08-14 ENCOUNTER — Other Ambulatory Visit: Payer: Self-pay

## 2020-08-14 ENCOUNTER — Ambulatory Visit: Payer: Medicare HMO | Admitting: Internal Medicine

## 2020-08-14 VITALS — BP 130/70 | HR 76 | Ht 67.0 in | Wt 194.0 lb

## 2020-08-14 DIAGNOSIS — R0600 Dyspnea, unspecified: Secondary | ICD-10-CM

## 2020-08-14 DIAGNOSIS — E7800 Pure hypercholesterolemia, unspecified: Secondary | ICD-10-CM

## 2020-08-14 DIAGNOSIS — R0609 Other forms of dyspnea: Secondary | ICD-10-CM | POA: Insufficient documentation

## 2020-08-14 DIAGNOSIS — G250 Essential tremor: Secondary | ICD-10-CM

## 2020-08-14 DIAGNOSIS — I700 Atherosclerosis of aorta: Secondary | ICD-10-CM | POA: Diagnosis not present

## 2020-08-14 DIAGNOSIS — R0602 Shortness of breath: Secondary | ICD-10-CM | POA: Insufficient documentation

## 2020-08-14 LAB — CBC
Hematocrit: 42.4 % (ref 37.5–51.0)
Hemoglobin: 14.4 g/dL (ref 13.0–17.7)
MCH: 30.6 pg (ref 26.6–33.0)
MCHC: 34 g/dL (ref 31.5–35.7)
MCV: 90 fL (ref 79–97)
Platelets: 235 10*3/uL (ref 150–450)
RBC: 4.71 x10E6/uL (ref 4.14–5.80)
RDW: 13.3 % (ref 11.6–15.4)
WBC: 6.2 10*3/uL (ref 3.4–10.8)

## 2020-08-14 LAB — BASIC METABOLIC PANEL
BUN/Creatinine Ratio: 16 (ref 10–24)
BUN: 16 mg/dL (ref 8–27)
CO2: 24 mmol/L (ref 20–29)
Calcium: 9.5 mg/dL (ref 8.6–10.2)
Chloride: 100 mmol/L (ref 96–106)
Creatinine, Ser: 1.03 mg/dL (ref 0.76–1.27)
Glucose: 97 mg/dL (ref 65–99)
Potassium: 4.8 mmol/L (ref 3.5–5.2)
Sodium: 137 mmol/L (ref 134–144)
eGFR: 77 mL/min/{1.73_m2} (ref >59)

## 2020-08-14 MED ORDER — ATORVASTATIN CALCIUM 40 MG PO TABS: 40.0000 mg | ORAL_TABLET | Freq: Every day | ORAL | 3 refills | Status: DC

## 2020-08-14 MED ORDER — METOPROLOL TARTRATE 25 MG PO TABS: 12.5000 mg | ORAL_TABLET | Freq: Two times a day (BID) | ORAL | 3 refills | Status: DC

## 2020-08-14 NOTE — Patient Instructions (Signed)
Medication Instructions:  Your physician has recommended you make the following change in your medication:  START: metoprolol tartrate (Lopressor) 12.5 mg by mouth twice daily INCREASE: atorvastatin (Lipitor) to 40 mg by mouth daily   *If you need a refill on your cardiac medications before your next appointment, please call your pharmacy*   Lab Work: TODAY: CBC and BMET If you have labs (blood work) drawn today and your tests are completely normal, you will receive your results only by: [place] (if you have MyChart) OR A paper copy in the mail If you have any lab test that is abnormal or we need to change your treatment, we will call you to review the results.   Testing/Procedures: Your physician has requested that you have a heart cath.    Follow-Up: At [facility], you and your health needs are our priority.  As part of our continuing mission to provide you with exceptional heart care, we have created designated Provider Care Teams.  These Care Teams include your primary Cardiologist (physician) and Advanced Practice Providers (APPs -  Physician Assistants and Nurse Practitioners) who all work together to provide you with the care you need, when you need it.  We recommend signing up for the patient portal called "MyChart".  Sign up information is provided on this After Visit Summary.  MyChart is used to connect with patients for Virtual Visits (Telemedicine).  Patients are able to view lab/test results, encounter notes, upcoming appointments, etc.  Non-urgent messages can be sent to your provider as well.   To learn more about what you can do with MyChart, go to [email or web address].    Your next appointment:   1 month(s)  The format for your next appointment:   In Person  Provider:   You may see Werner Lean, MD or one of the following Advanced Practice Providers on your designated Care Team:   Melina Copa, PA-C Ermalinda Barrios, PA-C   Other  Instructions    [place] OFFICE [place], [place] [address] Dept: [phone] Loc: [place].  08/14/2020  You are scheduled for a Cardiac Catheterization on Monday, July 11 with Dr. Peter Martinique.  1. Please arrive at the [facility] (Main Entrance A) at [facility]: [address] [place], [place] [address] at 8:30 AM (This time is two hours before your procedure to ensure your preparation). Free valet parking service is available.   Special note: Every effort is made to have your procedure done on time. Please understand that emergencies sometimes delay scheduled procedures.  2. Diet: Do not eat solid foods after midnight.  The patient may have clear liquids until 5am upon the day of the procedure.  3. Labs: You will need to have blood drawn Today  4. Medication instructions in preparation for your procedure:   Contrast Allergy: No    On the morning of your procedure, take your Aspirin 81 mg  and any morning medicines NOT listed above.  You may use sips of water.  5. Plan for one night stay--bring personal belongings. 6. Bring a current list of your medications and current insurance cards. 7. You MUST have a responsible person to drive you home. 8. Someone MUST be with you the first 24 hours after you arrive home or your discharge will be delayed. 9. Please wear clothes that are easy to get on and off and wear slip-on  shoes.  Thank you for allowing [place] to care for you!   -- [place] Invasive Cardiovascular services

## 2020-08-15 ENCOUNTER — Telehealth: Payer: Self-pay | Admitting: *Deleted

## 2020-08-15 DIAGNOSIS — M25511 Pain in right shoulder: Secondary | ICD-10-CM | POA: Diagnosis not present

## 2020-08-15 NOTE — Telephone Encounter (Signed)
Pt contacted pre-catheterization scheduled at [facility] for: Monday August 19, 2020 10:30 AM Verified arrival time and place: [facility]) at: 8:30 AM   No solid food after midnight prior to cath, clear liquids until 5 AM day of procedure.   AM meds can be  taken pre-cath with sips of water including: aspirin 81 mg   Confirmed patient has responsible adult to drive home post procedure and be with patient first 24 hours after arriving home: yes  You are allowed ONE visitor in the waiting room during the time you are at the hospital for your procedure. Both you and your visitor must wear a mask once you enter the hospital.   Patient reports does not currently have any symptoms concerning for COVID-19 and no household members with COVID-19 like illness.      Reviewed procedure/mask/visitor instructions with patient.

## 2020-08-19 ENCOUNTER — Ambulatory Visit (HOSPITAL_COMMUNITY)
Admission: RE | Admit: 2020-08-19 | Discharge: 2020-08-19 | Disposition: A | Discharge status: Home / Self Care | Payer: Medicare HMO | Attending: Cardiology | Admitting: Cardiology

## 2020-08-19 ENCOUNTER — Encounter (HOSPITAL_COMMUNITY)
Admission: RE | Disposition: A | Discharge status: Home / Self Care | Payer: Self-pay | Source: Home / Self Care | Attending: Cardiology

## 2020-08-19 ENCOUNTER — Encounter (HOSPITAL_COMMUNITY): Payer: Self-pay | Admitting: Cardiology

## 2020-08-19 DIAGNOSIS — Z885 Allergy status to narcotic agent status: Secondary | ICD-10-CM | POA: Diagnosis not present

## 2020-08-19 DIAGNOSIS — Z79899 Other long term (current) drug therapy: Secondary | ICD-10-CM | POA: Diagnosis not present

## 2020-08-19 DIAGNOSIS — I700 Atherosclerosis of aorta: Secondary | ICD-10-CM | POA: Diagnosis present

## 2020-08-19 DIAGNOSIS — R0602 Shortness of breath: Secondary | ICD-10-CM | POA: Diagnosis present

## 2020-08-19 DIAGNOSIS — R0609 Other forms of dyspnea: Secondary | ICD-10-CM | POA: Insufficient documentation

## 2020-08-19 DIAGNOSIS — R0600 Dyspnea, unspecified: Secondary | ICD-10-CM | POA: Diagnosis not present

## 2020-08-19 DIAGNOSIS — Z888 Allergy status to other drugs, medicaments and biological substances status: Secondary | ICD-10-CM | POA: Diagnosis not present

## 2020-08-19 DIAGNOSIS — G250 Essential tremor: Secondary | ICD-10-CM | POA: Insufficient documentation

## 2020-08-19 DIAGNOSIS — Z91040 Latex allergy status: Secondary | ICD-10-CM | POA: Insufficient documentation

## 2020-08-19 DIAGNOSIS — I2510 Atherosclerotic heart disease of native coronary artery without angina pectoris: Secondary | ICD-10-CM | POA: Insufficient documentation

## 2020-08-19 DIAGNOSIS — E7800 Pure hypercholesterolemia, unspecified: Secondary | ICD-10-CM | POA: Diagnosis not present

## 2020-08-19 HISTORY — PX: LEFT HEART CATH AND CORONARY ANGIOGRAPHY: CATH118249

## 2020-08-19 SURGERY — LEFT HEART CATH AND CORONARY ANGIOGRAPHY
Anesthesia: LOCAL

## 2020-08-19 MED ORDER — SODIUM CHLORIDE 0.9 % IV SOLN: 250.0000 mL | INTRAVENOUS | Status: DC | PRN

## 2020-08-19 MED ORDER — SODIUM CHLORIDE 0.9 % WEIGHT BASED INFUSION: 1.0000 mL/kg/h | INTRAVENOUS | Status: DC

## 2020-08-19 MED ORDER — VERAPAMIL HCL 2.5 MG/ML IV SOLN
INTRAVENOUS | Status: AC
  Filled 2020-08-19: qty 2

## 2020-08-19 MED ORDER — SODIUM CHLORIDE 0.9% FLUSH: 3.0000 mL | Freq: Two times a day (BID) | INTRAVENOUS | Status: DC

## 2020-08-19 MED ORDER — FENTANYL CITRATE (PF) 100 MCG/2ML IJ SOLN
INTRAMUSCULAR | Status: AC
  Filled 2020-08-19: qty 2

## 2020-08-19 MED ORDER — SODIUM CHLORIDE 0.9% FLUSH: 3.0000 mL | INTRAVENOUS | Status: DC | PRN

## 2020-08-19 MED ORDER — HEPARIN SODIUM (PORCINE) 1000 UNIT/ML IJ SOLN
INTRAMUSCULAR | Status: AC
  Filled 2020-08-19: qty 1

## 2020-08-19 MED ORDER — HEPARIN (PORCINE) IN NACL 1000-0.9 UT/500ML-% IV SOLN
INTRAVENOUS | Status: AC
  Filled 2020-08-19: qty 1000

## 2020-08-19 MED ORDER — LIDOCAINE HCL (PF) 1 % IJ SOLN
INTRAMUSCULAR | Status: AC
  Filled 2020-08-19: qty 30

## 2020-08-19 MED ORDER — LIDOCAINE HCL (PF) 1 % IJ SOLN
INTRAMUSCULAR | Status: DC | PRN
  Administered 2020-08-19: 2 mL

## 2020-08-19 MED ORDER — MIDAZOLAM HCL 2 MG/2ML IJ SOLN
INTRAMUSCULAR | Status: AC
  Filled 2020-08-19: qty 2

## 2020-08-19 MED ORDER — VERAPAMIL HCL 2.5 MG/ML IV SOLN
INTRAVENOUS | Status: DC | PRN
  Administered 2020-08-19: 10 mL via INTRA_ARTERIAL

## 2020-08-19 MED ORDER — HEPARIN SODIUM (PORCINE) 1000 UNIT/ML IJ SOLN
INTRAMUSCULAR | Status: DC | PRN
  Administered 2020-08-19: 4500 [IU] via INTRAVENOUS

## 2020-08-19 MED ORDER — ONDANSETRON HCL 4 MG/2ML IJ SOLN: 4.0000 mg | Freq: Four times a day (QID) | INTRAMUSCULAR | Status: DC | PRN

## 2020-08-19 MED ORDER — MIDAZOLAM HCL 2 MG/2ML IJ SOLN
INTRAMUSCULAR | Status: DC | PRN
  Administered 2020-08-19: 1 mg via INTRAVENOUS

## 2020-08-19 MED ORDER — FENTANYL CITRATE (PF) 100 MCG/2ML IJ SOLN
INTRAMUSCULAR | Status: DC | PRN
  Administered 2020-08-19: 25 ug via INTRAVENOUS

## 2020-08-19 MED ORDER — HEPARIN (PORCINE) IN NACL 1000-0.9 UT/500ML-% IV SOLN
INTRAVENOUS | Status: DC | PRN
  Administered 2020-08-19 (×2): 500 mL

## 2020-08-19 MED ORDER — ASPIRIN 81 MG PO CHEW: 81.0000 mg | CHEWABLE_TABLET | ORAL | Status: DC

## 2020-08-19 MED ORDER — ACETAMINOPHEN 325 MG PO TABS: 650.0000 mg | ORAL_TABLET | ORAL | Status: DC | PRN

## 2020-08-19 MED ORDER — IOHEXOL 350 MG/ML SOLN
INTRAVENOUS | Status: DC | PRN
  Administered 2020-08-19: 55 mL

## 2020-08-19 MED ORDER — SODIUM CHLORIDE 0.9 % WEIGHT BASED INFUSION
3.0000 mL/kg/h | INTRAVENOUS | Status: AC
  Administered 2020-08-19: 3 mL/kg/h via INTRAVENOUS

## 2020-08-19 SURGICAL SUPPLY — 8 items
CATH 5FR JL3.5 JR4 ANG PIG MP (CATHETERS) ×2 IMPLANT
DEVICE RAD COMP TR BAND LRG (VASCULAR PRODUCTS) ×2 IMPLANT
GLIDESHEATH SLEND SS 6F .021 (SHEATH) ×2 IMPLANT
KIT HEART LEFT (KITS) ×2 IMPLANT
PACK CARDIAC CATHETERIZATION (CUSTOM PROCEDURE TRAY) ×2 IMPLANT
TRANSDUCER W/STOPCOCK (MISCELLANEOUS) ×2 IMPLANT
TUBING CIL FLEX 10 FLL-RA (TUBING) ×2 IMPLANT
WIRE HI TORQ VERSACORE J 260CM (WIRE) ×2 IMPLANT

## 2020-08-19 NOTE — Interval H&P Note (Signed)
History and Physical Interval Note:  08/19/2020 9:19 AM  Richard Beat.  has presented today for surgery, with the diagnosis of cad.  The various methods of treatment have been discussed with the patient and family. After consideration of risks, benefits and other options for treatment, the patient has consented to  Procedure(s): LEFT HEART CATH AND CORONARY ANGIOGRAPHY (N/A) as a surgical intervention.  The patient's history has been reviewed, patient examined, no change in status, stable for surgery.  I have reviewed the patient's chart and labs.  Questions were answered to the patient's satisfaction.   Cath Lab Visit (complete for each Cath Lab visit)  Clinical Evaluation Leading to the Procedure:   ACS: No.  Non-ACS:    Anginal Classification: CCS II  Anti-ischemic medical therapy: Minimal Therapy (1 class of medications)  Non-Invasive Test Results: No non-invasive testing performed  Prior CABG: No previous CABG        Richard Arnold [facility] 08/19/2020 9:19 AM

## 2020-08-23 ENCOUNTER — Other Ambulatory Visit: Payer: Self-pay | Admitting: Family Medicine

## 2020-09-03 ENCOUNTER — Telehealth: Payer: Self-pay | Admitting: *Deleted

## 2020-09-03 ENCOUNTER — Ambulatory Visit: Payer: Medicare HMO | Admitting: Internal Medicine

## 2020-09-03 ENCOUNTER — Other Ambulatory Visit: Payer: Self-pay

## 2020-09-03 ENCOUNTER — Encounter: Payer: Self-pay | Admitting: Internal Medicine

## 2020-09-03 VITALS — BP 138/70 | HR 73 | Ht 68.0 in | Wt 197.0 lb

## 2020-09-03 DIAGNOSIS — R0600 Dyspnea, unspecified: Secondary | ICD-10-CM | POA: Diagnosis not present

## 2020-09-03 DIAGNOSIS — T466X5A Adverse effect of antihyperlipidemic and antiarteriosclerotic drugs, initial encounter: Secondary | ICD-10-CM | POA: Diagnosis not present

## 2020-09-03 DIAGNOSIS — M791 Myalgia, unspecified site: Secondary | ICD-10-CM | POA: Insufficient documentation

## 2020-09-03 DIAGNOSIS — G250 Essential tremor: Secondary | ICD-10-CM | POA: Diagnosis not present

## 2020-09-03 DIAGNOSIS — I25118 Atherosclerotic heart disease of native coronary artery with other forms of angina pectoris: Secondary | ICD-10-CM | POA: Diagnosis not present

## 2020-09-03 DIAGNOSIS — R0609 Other forms of dyspnea: Secondary | ICD-10-CM

## 2020-09-03 DIAGNOSIS — E7800 Pure hypercholesterolemia, unspecified: Secondary | ICD-10-CM

## 2020-09-03 DIAGNOSIS — I700 Atherosclerosis of aorta: Secondary | ICD-10-CM

## 2020-09-03 MED ORDER — NITROGLYCERIN 0.4 MG SL SUBL: 0.4000 mg | SUBLINGUAL_TABLET | SUBLINGUAL | 3 refills | Status: DC | PRN

## 2020-09-03 MED ORDER — EZETIMIBE 10 MG PO TABS: 10.0000 mg | ORAL_TABLET | Freq: Every day | ORAL | 3 refills | Status: DC

## 2020-09-03 MED ORDER — FUROSEMIDE 20 MG PO TABS: 20.0000 mg | ORAL_TABLET | Freq: Every day | ORAL | 3 refills | Status: DC

## 2020-09-03 MED ORDER — ATORVASTATIN CALCIUM 20 MG PO TABS: 20.0000 mg | ORAL_TABLET | Freq: Every day | ORAL | 3 refills | Status: DC

## 2020-09-03 NOTE — Chronic Care Management (AMB) (Signed)
  Chronic Care Management   Note  09/03/2020 Name: Trayon Krantz. MRN: [id number] DOB: [birth year]  [name] is a 70 y.o. year old male who is a primary care patient of Dennard Schaumann, Cammie Mcgee, MD. I reached out to [name] by phone today in response to a referral sent by Mr. ALECSANDER HATTABAUGH Jr.'s PCP, Dr. Dennard Schaumann      Mr. Mundorf was given information about Chronic Care Management services today including:  CCM service includes personalized support from designated clinical staff supervised by his physician, including individualized plan of care and coordination with other care providers 24/7 contact phone numbers for assistance for urgent and routine care needs. Service will only be billed when office clinical staff spend 20 minutes or more in a month to coordinate care. Only one practitioner may furnish and bill the service in a calendar month. The patient may stop CCM services at any time (effective at the end of the month) by phone call to the office staff. The patient will be responsible for cost sharing (co-pay) of up to 20% of the service fee (after annual deductible is met).  Patient agreed to services and verbal consent obtained.   Follow up plan: Telephone appointment with care management team member scheduled for:09/23/20  Stacey Snead  Care Guide, Embedded Care Coordination [place]  Care Management  Direct Dial: [phone]

## 2020-09-03 NOTE — Patient Instructions (Signed)
Medication Instructions:  Your physician has recommended you make the following change in your medication:  DECREASE: atorvastatin (Lipitor) to 20 mg by mouth daily START:  ezetimibe (Zetia) 10 mg by mouth daily START:  furosemide (Lasix) 20 mg by mouth daily START:  nitroglycerin 0.4 mg under tongue as needed for Chest pain  Place 1 tablet under tongue wait 5 minutes if chest pain continues  Place 2nd tablet under tongue wait 5 minutes if chest pain continues  Place 3rd tablet under tongue if after 5 minutes chest pain continues call 911 *If you need a refill on your cardiac medications before your next appointment, please call your pharmacy*   Lab Work: In 7-10 days: BMP If you have labs (blood work) drawn today and your tests are completely normal, you will receive your results only by: [place] (if you have MyChart) OR A paper copy in the mail If you have any lab test that is abnormal or we need to change your treatment, we will call you to review the results.   Testing/Procedures: NONE   Follow-Up: At [facility], you and your health needs are our priority.  As part of our continuing mission to provide you with exceptional heart care, we have created designated Provider Care Teams.  These Care Teams include your primary Cardiologist (physician) and Advanced Practice Providers (APPs -  Physician Assistants and Nurse Practitioners) who all work together to provide you with the care you need, when you need it.  Your next appointment:   3 month(s)  The format for your next appointment:   In Person  Provider:   You may see Werner Lean, MD or one of the following Advanced Practice Providers on your designated Care Team:   Melina Copa, PA-C Ermalinda Barrios, PA-C

## 2020-09-03 NOTE — Progress Notes (Signed)
Cardiology Office Note:    Date:  09/03/2020   ID:  Richard Beat., DOB [birth year], MRN [id number]  PCP:  Richard Frizzle, MD   [facility] Providers Cardiologist:  Richard Lean, MD     Referring MD: Richard Frizzle, MD   CC: CAD f/u  History of Present Illness:    Richard Arnold. is a 70 y.o. male with a hx of HLD who presents for evaluation 08/14/20. In interim of this visit, patient had cath with OM2 disease that was two small for intervention.  Seen 09/03/20.  Patient notes that he is doing OK.  Since last visit notes LHC.  Relevant interval testing or therapy include that with the increase in his atorvastatin was increased has some muscle aches and pains.  There are no interval hospital/ED visit.    Has some sternal chest pain with a sick feeing after going out to do things.   Still has some dyspnea on exertion.  No weight gain or leg swelling.  No palpitations or syncope.  Wife notes that he initially felt better on lose dose metoprolol.  Past Medical History:  Diagnosis Date   Arthritis    Brachial neuritis    Gastric ulcer    GI bleed    H pylori ulcer    Hypercholesteremia    Turner syndrome    left arm, some numbness in fingers    Past Surgical History:  Procedure Laterality Date   COLONOSCOPY WITH PROPOFOL N/A 03/05/2015   Procedure: COLONOSCOPY WITH PROPOFOL;  Surgeon: Richard Fair, MD;  Location: [facility];  Service: Endoscopy;  Laterality: N/A;   ESOPHAGOGASTRODUODENOSCOPY  09/08/2011   Procedure: ESOPHAGOGASTRODUODENOSCOPY (EGD);  Surgeon: Richard Cunas., MD;  Location: [facility];  Service: Endoscopy;  Laterality: N/A;   HERNIA REPAIR     INGUINAL HERNIA REPAIR Right 08/30/2013   Procedure: HERNIA REPAIR INGUINAL WITH MESH  ADULT;  Surgeon: Richard Earls, MD;  Location: [facility];  Service: General;  Laterality: Right;   LEFT HEART CATH AND CORONARY ANGIOGRAPHY N/A 08/19/2020   Procedure: LEFT HEART CATH AND CORONARY  ANGIOGRAPHY;  Surgeon: Martinique, Peter M, MD;  Location: [facility];  Service: Cardiovascular;  Laterality: N/A;   SHOULDER ARTHROSCOPY  2011   x4  one open rt, rotator cuff tear, Dr. Amedeo Plenty   TOTAL SHOULDER ARTHROPLASTY  01/30/2011   Procedure: TOTAL SHOULDER ARTHROPLASTY;  Surgeon: Augustin Schooling;  Location: [facility];  Service: Orthopedics;  Laterality: Right;  right total shoulder atrhoplasty    Current Medications: Current Meds  Medication Sig   acetaminophen (TYLENOL) 650 MG CR tablet Take 650 mg by mouth every 8 (eight) hours as needed for pain.   aspirin EC 81 MG tablet Take 81 mg by mouth daily. Swallow whole.   atorvastatin (LIPITOR) 20 MG tablet Take 1 tablet (20 mg total) by mouth daily.   diphenhydrAMINE (BENADRYL) 25 MG tablet Take 25 mg by mouth 2 (two) times daily.   ezetimibe (ZETIA) 10 MG tablet Take 1 tablet (10 mg total) by mouth daily.   fish oil-omega-3 fatty acids 1000 MG capsule Take 2 g by mouth daily.   furosemide (LASIX) 20 MG tablet Take 1 tablet (20 mg total) by mouth daily.   metoprolol tartrate (LOPRESSOR) 25 MG tablet Take 0.5 tablets (12.5 mg total) by mouth 2 (two) times daily.   Multiple Vitamin (MULTIVITAMIN WITH MINERALS) TABS tablet Take 1 tablet by mouth daily.   nitroGLYCERIN (  NITROSTAT) 0.4 MG SL tablet Place 1 tablet (0.4 mg total) under the tongue every 5 (five) minutes as needed for chest pain. You can take up to 3 tablets   pantoprazole (PROTONIX) 40 MG tablet TAKE 1 TABLET BY MOUTH TWICE A DAY BEFORE A MEAL   primidone (MYSOLINE) 50 MG tablet Take 2 tablets (100 mg total) by mouth at bedtime.   tamsulosin (FLOMAX) 0.4 MG CAPS capsule TAKE 1 CAPSULE BY MOUTH EVERY DAY   TURMERIC PO Take 1 capsule by mouth daily.   vitamin C (ASCORBIC ACID) 500 MG tablet Take 500 mg by mouth daily.   Zinc 50 MG TABS Take 50 mg by mouth daily.   [DISCONTINUED] atorvastatin (LIPITOR) 40 MG tablet Take 1 tablet (40 mg total) by mouth daily.     Allergies:    Monosodium glutamate, Tylox [oxycodone-acetaminophen], and Latex   Social History   Socioeconomic History   Marital status: Married    Spouse name: Not on file   Number of children: Not on file   Years of education: Not on file   Highest education level: Not on file  Occupational History   Not on file  Tobacco Use   Smoking status: Never   Smokeless tobacco: Never  Vaping Use   Vaping Use: Never used  Substance and Sexual Activity   Alcohol use: No   Drug use: No   Sexual activity: Not on file  Other Topics Concern   Not on file  Social History Narrative   Not on file   Social Determinants of Health   Financial Resource Strain: Not on file  Food Insecurity: Not on file  Transportation Needs: Not on file  Physical Activity: Not on file  Stress: Not on file  Social Connections: Not on file    Social: Comes with Wife, from [place]  Family History: The patient's family history includes Heart disease in his father and mother. History of coronary artery disease notable for father with multiple bypass surgeries, mother, uncle, and grandfather.  ROS:   Please see the history of present illness.    All other systems reviewed and are negative.  EKGs/Labs/Other Studies Reviewed:    The following studies were reviewed today:  EKG:  EKG is  ordered today.  The ekg ordered today demonstrates  08/14/20: SR rate 76 WNL  Transthoracic Echocardiogram Date:06/12/20 Results: 1. Left ventricular ejection fraction, by estimation, is 50 to 55%. The  left ventricle has low normal function. The left ventricle has no regional  wall motion abnormalities. Definity contrast agent was given IV to  delineate the left ventricular  endocardial borders. The left ventricular internal cavity size was normal  in size. There is no left ventricular hypertrophy. Left ventricular  diastolic parameters are consistent with Grade I diastolic dysfunction  (impaired relaxation).  2. The right ventricular  size is normal. No increase in right ventricular  wall thickness. Right ventricular systolic function is normal. There is  normal pulmonary artery systolic pressure. The tricuspid regurgitant  velocity is 2.29 Arnold/s, and with an  assumed right atrial pressure of 8 mmHg, the estimated right ventricular  systolic pressure is 0000000 mmHg.  3. The mitral valve is abnormal. Mild to moderate mitral annular  calcification. Trivial mitral valve regurgitation. No evidence of mitral  valve stenosis.  4. The aortic valve was not well visualized. Aortic valve regurgitation is  not visualized. No aortic stenosis is present.  5. Prominent Chiari network seen in the subcostal.   NonCardiac CT:  Date:12/16/2006 Results: LM, LAD CAC and Aortic Atherosclerosis  Left/Right Heart Catheterizations: Date: 08/09/20 Results: Prox RCA to Mid RCA lesion is 60% stenosed. Ost RCA lesion is 50% stenosed. Dist LM to Ost LAD lesion is 25% stenosed. 2nd Mrg lesion is 80% stenosed. LPAV lesion is 40% stenosed with 40% stenosed side branch in LPDA. LV end diastolic pressure is mildly elevated.   1. Nonobstructive CAD except for a tiny second OM 2. Mildly elevated LVEDP   Recent Labs: 05/03/2020: ALT 21 08/14/2020: BUN 16; Creatinine, Ser 1.03; Hemoglobin 14.4; Platelets 235; Potassium 4.8; Sodium 137  Recent Lipid Panel    Component Value Date/Time   CHOL 149 05/03/2020 1012   TRIG 92 05/03/2020 1012   HDL 47 05/03/2020 1012   CHOLHDL 3.2 05/03/2020 1012   LDLCALC 83 05/03/2020 1012    Risk Assessment/Calculations:     N/A      Physical Exam:    VS:  BP 138/70   Pulse 73   Ht '5\' 8"'$  (1.727 Arnold)   Wt 197 lb (89.4 kg)   SpO2 98%   BMI 29.95 kg/Arnold     Wt Readings from Last 3 Encounters:  09/03/20 197 lb (89.4 kg)  08/19/20 194 lb (88 kg)  08/14/20 194 lb (88 kg)     GEN:  Well nourished, well developed in no acute distress HEENT: R sided Frank's Sign NECK: No JVD LYMPHATICS: No  lymphadenopathy CARDIAC: RRR, no murmurs, rubs, gallops +3 radial pulses without bruit and with good cap refill; still slightly tender RESPIRATORY:  Clear to auscultation without rales, wheezing or rhonchi  ABDOMEN: Soft, non-tender, non-distended MUSCULOSKELETAL:  No edema; No deformity  SKIN: Warm and dry NEUROLOGIC:  Alert and oriented x 3; essential tremor in left arm (similar to prior) PSYCHIATRIC:  Normal affect   ASSESSMENT:    1. Coronary artery disease of native artery of native heart with stable angina pectoris ([place])   2. DOE (dyspnea on exertion)   3. Aortic atherosclerosis ([place])   4. Essential tremor   5. Myalgia due to statin     PLAN:    CAD Aortic atherosclerosis  Essential Tremor (mild left side only) -continue ASA 81 mg PO Daily -Myalgias with atorvastatin 40 mg, will drop down to 20 mg PO daily and add Zetia 10 mg; lipids in 3 months -Continue metoprolol 12.5 mg PO BID Will add PRN nitro for CP - will trial lasix 20 mg PO BID and get labs in 10-14 days; will monitor for improvement in symptoms targeting his elevated LVEDP - Will see in 3 months for f/u    Medication Adjustments/Labs and Tests Ordered: Current medicines are reviewed at length with the patient today.  Concerns regarding medicines are outlined above.  Orders Placed This Encounter  Procedures   Basic metabolic panel     Meds ordered this encounter  Medications   furosemide (LASIX) 20 MG tablet    Sig: Take 1 tablet (20 mg total) by mouth daily.    Dispense:  90 tablet    Refill:  3   nitroGLYCERIN (NITROSTAT) 0.4 MG SL tablet    Sig: Place 1 tablet (0.4 mg total) under the tongue every 5 (five) minutes as needed for chest pain. You can take up to 3 tablets    Dispense:  30 tablet    Refill:  3   atorvastatin (LIPITOR) 20 MG tablet    Sig: Take 1 tablet (20 mg total) by mouth daily.    Dispense:  90 tablet    Refill:  3   ezetimibe (ZETIA) 10 MG tablet    Sig: Take 1 tablet (10 mg  total) by mouth daily.    Dispense:  90 tablet    Refill:  3      Patient Instructions  Medication Instructions:  Your physician has recommended you make the following change in your medication:  DECREASE: atorvastatin (Lipitor) to 20 mg by mouth daily START:  ezetimibe (Zetia) 10 mg by mouth daily START:  furosemide (Lasix) 20 mg by mouth daily START:  nitroglycerin 0.4 mg under tongue as needed for Chest pain  Place 1 tablet under tongue wait 5 minutes if chest pain continues  Place 2nd tablet under tongue wait 5 minutes if chest pain continues  Place 3rd tablet under tongue if after 5 minutes chest pain continues call 911 *If you need a refill on your cardiac medications before your next appointment, please call your pharmacy*   Lab Work: In 7-10 days: BMP If you have labs (blood work) drawn today and your tests are completely normal, you will receive your results only by: [place] (if you have MyChart) OR A paper copy in the mail If you have any lab test that is abnormal or we need to change your treatment, we will call you to review the results.   Testing/Procedures: NONE   Follow-Up: At [facility], you and your health needs are our priority.  As part of our continuing mission to provide you with exceptional heart care, we have created designated Provider Care Teams.  These Care Teams include your primary Cardiologist (physician) and Advanced Practice Providers (APPs -  Physician Assistants and Nurse Practitioners) who all work together to provide you with the care you need, when you need it.  Your next appointment:   3 month(s)  The format for your next appointment:   In Person  Provider:   You may see Richard Lean, MD or one of the following Advanced Practice Providers on your designated Care Team:   Melina Copa, PA-C Ermalinda Barrios, PA-C     Signed, Richard Lean, MD  09/03/2020 10:04 AM    [place]

## 2020-09-04 ENCOUNTER — Telehealth: Payer: Self-pay | Admitting: *Deleted

## 2020-09-04 MED ORDER — NIRMATRELVIR/RITONAVIR (PAXLOVID)TABLET: 3.0000 | ORAL_TABLET | Freq: Two times a day (BID) | ORAL | 0 refills | Status: AC

## 2020-09-04 NOTE — Telephone Encounter (Signed)
Patient tested positive for COVID on 09/03/2020 with home test.   Sx began 09/03/2020 on and include sore throat, body aches, malaise, fever (T Max 101), and slight SOB.   Advised to continue symptom management with OTC medications: Tylenol/ Ibuprofen for fever/ body aches, Mucinex/ Delsym for cough/ chest congestion, Afrin/Sudafed/nasal saline for sinus pressure/ nasal congestion.  GFR 77. Order for Paxlovid sent to pharmacy. Advised possible SE include nausea, diarrhea, loss of taste and hypertension.  If chest pain, shortness of breath, fever >104 that is unresponsive to antipyretics noted, or if patient is unable to tolerate fluids, advised to go to ER for evaluation.   Advised that the CDC recommends the following criteria prior to ending isolation in vaccinated persons at least 5 days since symptoms onset, and then an additional 5 days of masking,  AND 3 days fever free without antipyretics (Tylenol or Ibuprofen) AND improvement in respiratory symptoms.

## 2020-09-10 ENCOUNTER — Other Ambulatory Visit: Payer: Self-pay

## 2020-09-10 ENCOUNTER — Other Ambulatory Visit: Payer: Medicare HMO | Admitting: *Deleted

## 2020-09-10 DIAGNOSIS — R0600 Dyspnea, unspecified: Secondary | ICD-10-CM | POA: Diagnosis not present

## 2020-09-10 DIAGNOSIS — R0609 Other forms of dyspnea: Secondary | ICD-10-CM

## 2020-09-10 LAB — BASIC METABOLIC PANEL
BUN/Creatinine Ratio: 16 (ref 10–24)
BUN: 18 mg/dL (ref 8–27)
CO2: 22 mmol/L (ref 20–29)
Calcium: 9.3 mg/dL (ref 8.6–10.2)
Chloride: 103 mmol/L (ref 96–106)
Creatinine, Ser: 1.11 mg/dL (ref 0.76–1.27)
Glucose: 120 mg/dL — ABNORMAL HIGH (ref 65–99)
Potassium: 4.1 mmol/L (ref 3.5–5.2)
Sodium: 140 mmol/L (ref 134–144)
eGFR: 71 mL/min/{1.73_m2} (ref >59)

## 2020-09-13 ENCOUNTER — Other Ambulatory Visit: Payer: Self-pay | Admitting: Family Medicine

## 2020-09-23 ENCOUNTER — Ambulatory Visit: Payer: Medicare HMO | Admitting: Internal Medicine

## 2020-09-23 ENCOUNTER — Telehealth: Payer: Medicare HMO

## 2020-09-23 ENCOUNTER — Telehealth: Payer: Self-pay | Admitting: *Deleted

## 2020-09-23 NOTE — Telephone Encounter (Signed)
  Care Management   Follow Up Note   09/23/2020 Name: Richard Arnold. MRN: [id number] DOB: October 18, 1950   Referred by: Susy Frizzle, MD Reason for referral : Chronic Care Management (HLD, Aortic Atherosclerosis/)   An unsuccessful telephone outreach was attempted today. The patient was referred to the case management team for assistance with care management and care coordination.   Follow Up Plan: Telephone follow up appointment with care management team member scheduled for:  upon care guide rescheduling.  Jacqlyn Larsen RNC, BSN RN Case Manager [facility] [phone]

## 2020-09-26 ENCOUNTER — Telehealth: Payer: Self-pay | Admitting: *Deleted

## 2020-09-26 NOTE — Telephone Encounter (Signed)
Received call from patient.   Reports that he is S/P COVID since 09/03/2020. Reports that he was treated with Paxlovid.  States that he continues to have cough from post-nasal drip.   Inquired as to what treatment available for cough.

## 2020-09-27 ENCOUNTER — Other Ambulatory Visit: Payer: Self-pay | Admitting: Family Medicine

## 2020-09-27 MED ORDER — HYDROCODONE BIT-HOMATROP MBR 5-1.5 MG/5ML PO SOLN: 5.0000 mL | Freq: Three times a day (TID) | ORAL | 0 refills | Status: DC | PRN

## 2020-10-18 ENCOUNTER — Ambulatory Visit (INDEPENDENT_AMBULATORY_CARE_PROVIDER_SITE_OTHER): Payer: Medicare HMO | Admitting: *Deleted

## 2020-10-18 DIAGNOSIS — I700 Atherosclerosis of aorta: Secondary | ICD-10-CM

## 2020-10-18 DIAGNOSIS — E7800 Pure hypercholesterolemia, unspecified: Secondary | ICD-10-CM

## 2020-10-18 NOTE — Chronic Care Management (AMB) (Signed)
Chronic Care Management   CCM RN Visit Note  10/18/2020 Name: Richard Arnold. MRN: [id number] DOB: July 27, 1950  Subjective: Richard Beat. is a 70 y.o. year old male who is a primary care patient of Pickard, Cammie Mcgee, MD. The care management team was consulted for assistance with disease management and care coordination needs.    Engaged with patient by telephone for initial visit in response to provider referral for case management and/or care coordination services.   Consent to Services:  The patient was given the following information about Chronic Care Management services today, agreed to services, and gave verbal consent: 1. CCM service includes personalized support from designated clinical staff supervised by the primary care provider, including individualized plan of care and coordination with other care providers 2. 24/7 contact phone numbers for assistance for urgent and routine care needs. 3. Service will only be billed when office clinical staff spend 20 minutes or more in a month to coordinate care. 4. Only one practitioner may furnish and bill the service in a calendar month. 5.The patient may stop CCM services at any time (effective at the end of the month) by phone call to the office staff. 6. The patient will be responsible for cost sharing (co-pay) of up to 20% of the service fee (after annual deductible is met). Patient agreed to services and consent obtained.  Patient agreed to services and verbal consent obtained.   Assessment: Review of patient past medical history, allergies, medications, health status, including review of consultants reports, laboratory and other test data, was performed as part of comprehensive evaluation and provision of chronic care management services.   SDOH (Social Determinants of Health) assessments and interventions performed:   Transportation Needs: No Transportation Needs   Lack of Transportation (Medical): No   Lack of Transportation  (Non-Medical): No    Food Insecurity: No Food Insecurity   Worried About Charity fundraiser in the Last Year: Never true   Ran Out of Food in the Last Year: Never true     CCM Care Plan  Allergies  Allergen Reactions   Monosodium Glutamate Swelling    Rash, throat swells   Tylox [Oxycodone-Acetaminophen]     Pt states he had a "weird feeling"   Latex Rash    Outpatient Encounter Medications as of 10/18/2020  Medication Sig   acetaminophen (TYLENOL) 650 MG CR tablet Take 650 mg by mouth every 8 (eight) hours as needed for pain.   aspirin EC 81 MG tablet Take 81 mg by mouth daily. Swallow whole.   atorvastatin (LIPITOR) 20 MG tablet Take 1 tablet (20 mg total) by mouth daily.   diphenhydrAMINE (BENADRYL) 25 MG tablet Take 25 mg by mouth 2 (two) times daily.   ezetimibe (ZETIA) 10 MG tablet Take 1 tablet (10 mg total) by mouth daily.   fish oil-omega-3 fatty acids 1000 MG capsule Take 2 g by mouth daily.   furosemide (LASIX) 20 MG tablet Take 1 tablet (20 mg total) by mouth daily.   metoprolol tartrate (LOPRESSOR) 25 MG tablet Take 0.5 tablets (12.5 mg total) by mouth 2 (two) times daily.   Multiple Vitamin (MULTIVITAMIN WITH MINERALS) TABS tablet Take 1 tablet by mouth daily.   nitroGLYCERIN (NITROSTAT) 0.4 MG SL tablet Place 1 tablet (0.4 mg total) under the tongue every 5 (five) minutes as needed for chest pain. You can take up to 3 tablets   pantoprazole (PROTONIX) 40 MG tablet TAKE 1 TABLET BY MOUTH TWICE A  DAY BEFORE A MEAL   primidone (MYSOLINE) 50 MG tablet Take 2 tablets (100 mg total) by mouth at bedtime.   tamsulosin (FLOMAX) 0.4 MG CAPS capsule TAKE 1 CAPSULE BY MOUTH EVERY DAY   TURMERIC PO Take 1 capsule by mouth daily.   vitamin C (ASCORBIC ACID) 500 MG tablet Take 500 mg by mouth daily.   Zinc 50 MG TABS Take 50 mg by mouth daily.   HYDROcodone bit-homatropine (HYCODAN) 5-1.5 MG/5ML syrup Take 5 mLs by mouth every 8 (eight) hours as needed for cough.   No  facility-administered encounter medications on file as of 10/18/2020.    Patient Active Problem List   Diagnosis Date Noted   Coronary artery disease of native artery of native heart with stable angina pectoris ([place]) 09/03/2020   Myalgia due to statin 09/03/2020   DOE (dyspnea on exertion) 08/14/2020   Aortic atherosclerosis ([place]) 08/14/2020   Essential tremor 08/14/2020   Hypomagnesemia 05/20/2018   Gastrointestinal hemorrhage with melena 05/19/2018   Hypercholesteremia 05/19/2018   Insomnia 05/19/2018   S/P right inguinal hernia repair July 2015 08/30/2013   [place]-Turner syndrome-left upper extremity 08/22/2013   Osteoarthrosis, unspecified whether generalized or localized, shoulder region 01/30/2011    Conditions to be addressed/monitored:CAD and HLD  Care Plan : Hyperlipidemia  Updates made by Kassie Mends, RN since 10/18/2020 12:00 AM     Problem: Health Promotion or Disease Self-Management (Hyperlipidemia)   Priority: Medium     Long-Range Goal: Self-Management Plan Developed for hyperlipidemia   Start Date: 10/18/2020  Expected End Date: 04/17/2021  This Visit's Progress: On track  Priority: Medium  Note:   Current Barriers:  Poorly controlled hyperlipidemia, complicated by not always following heart healthy diet Current antihyperlipidemic regimen: atorvastatin and ezetimibe Most recent lipid panel:     Component Value Date/Time   CHOL 149 05/03/2020 1012   TRIG 92 05/03/2020 1012   HDL 47 05/03/2020 1012   CHOLHDL 3.2 05/03/2020 1012   LDLCALC 83 05/03/2020 1012  Patient reports he lives with spouse, is independent with all aspects of his care, continues to drive, does not have or use DME, continues to be active and rides bicycle, works in his yard, does not have advanced directives and declines. ASCVD risk enhancing conditions: age >64 Does not adhere to provider recommendations re: following heart healthy diet RN Care Manager Clinical Goal(s):  patient will  work with Consulting civil engineer, providers, and care team towards execution of optimized self-health management plan patient will verbalize understanding of plan for hyperlipidemia management the patient will demonstrate ongoing self health care management ability Interventions: Collaboration with Susy Frizzle, MD regarding development and update of comprehensive plan of care as evidenced by provider attestation and co-signature Inter-disciplinary care team collaboration (see longitudinal plan of care) Medication review performed; medication list updated in electronic medical record.  Inter-disciplinary care team collaboration (see longitudinal plan of care) Provided education to patient and/or caregiver about advanced directives Provided education to patient re: heart healthy diet, food choices, importance of exercising daily Reviewed medications with patient and discussed importance of taking as presribed Reviewed scheduled/upcoming provider appointments including: 10/31 Dr. Gasper Sells (cardiologist) Discussed plans with patient for ongoing care management follow up and provided patient with direct contact information for care management team Education sent via My Chart heart healthy diet Patient Goals/Self-Care Activities: - discuss my treatment options with the doctor or nurse - make shared treatment decisions with doctor  - take all medications as prescribed - continue to  exercise, ride bicycle and work in your yard - follow heart healthy diet- avoid saturated and trans fats - look over information sent via My Chart- heart healthy diet - call for medicine refill 2 or 3 days before it runs out - keep a list of all the medicines I take; vitamins and herbals too - if I have chest pain, call for help - learn about small changes that will make a big difference - learn my personal risk factors Follow Up Plan: Telephone follow up appointment with care management team member scheduled for:   12/16/2020      Care Plan : Coronary Artery Disease (Aortic Atherosclerosis)  Updates made by Kassie Mends, RN since 10/18/2020 12:00 AM     Problem: Disease Progression (Coronary Artery Disease)   Priority: Medium     Long-Range Goal: Disease Progression Prevented or Minimized   Start Date: 10/18/2020  Expected End Date: 04/17/2021  This Visit's Progress: On track  Priority: Medium  Note:   Current Barriers:  Ineffective Self Health Maintenance in a patient with CAD and HLD Does not adhere to provider recommendations re:   heart healthy diet Clinical Goal(s):  Collaboration with Susy Frizzle, MD regarding development and update of comprehensive plan of care as evidenced by provider attestation and co-signature Inter-disciplinary care team collaboration (see longitudinal plan of care) patient will work with care management team to address care coordination and chronic disease management needs related to Disease Management Educational Needs Care Coordination Medication Management and Education   Interventions:  Evaluation of current treatment plan related to CAD and HLD, self-management and patient's adherence to plan as established by provider. Collaboration with Susy Frizzle, MD regarding development and update of comprehensive plan of care as evidenced by provider attestation       and co-signature Inter-disciplinary care team collaboration (see longitudinal plan of care) Discussed plans with patient for ongoing care management follow up and provided patient with direct contact information for care management team Reviewed importance of eating healthy, limiting fast food, following low sodium and heart healthy diet Encouraged patient to continue exercising daily Reviewed upcoming scheduled appointments Self Care Activities:  Self administers medications as prescribed Attends all scheduled provider appointments Calls provider office for new concerns or  questions Patient Goals: - know and watch for signs of a heart attack - if I have chest pain, call for help - use nitroglycerin as prescribed - learn about small changes that will make a big difference - learn my personal risk factors  - follow up with cardiologist 10/31 - take medications as prescribed - continue to exercise Follow Up Plan: Telephone follow up appointment with care management team member scheduled for:   12/16/2020      Plan:Telephone follow up appointment with care management team member scheduled for:  12/16/2020   Jacqlyn Larsen [facility], BSN RN Case Manager [facility] [phone]

## 2020-10-18 NOTE — Patient Instructions (Addendum)
Visit Information   PATIENT GOALS:   Goals Addressed             This Visit's Progress    Improve My Heart Health-Coronary Artery Disease/ aortic atherosclerosis       Timeframe:  Long-Range Goal Priority:  Medium Start Date:         10/18/2020                    Expected End Date:  04/17/2020                     Follow Up Date 12/16/2020   - know and watch for signs of a heart attack - if I have chest pain, call for help - use nitroglycerin as prescribed - learn about small changes that will make a big difference - learn my personal risk factors  - follow up with cardiologist 10/31 - take medications as prescribed - continue to exercise   Why is this important?   Lifestyle changes are key to improving the blood flow to your heart. Think about the things you can change and set a goal to live healthy.  Remember, when the blood vessels to your heart start to get clogged you may not have any symptoms.  Over time, they can get worse.  Don't ignore the signs, like chest pain, and get help right away.     Notes:      Management of hyperlipidemia       Timeframe:  Long-Range Goal Priority:  Medium Start Date:         10/18/2020                    Expected End Date:     04/17/2020                  Follow Up Date 12/16/2020   - discuss my treatment options with the doctor or nurse - make shared treatment decisions with doctor  - take all medications as prescribed - continue to exercise, ride bicycle and work in your yard - follow heart healthy diet- avoid saturated and trans fats - look over information sent via My Chart- heart healthy diet   Why is this important?   Having a long-term illness can be scary.  It can also be stressful for you and your caregiver.  These steps may help.    Notes:         Consent to CCM Services: Mr. Bill was given information about Chronic Care Management services including:  CCM service includes personalized support from designated clinical  staff supervised by his physician, including individualized plan of care and coordination with other care providers 24/7 contact phone numbers for assistance for urgent and routine care needs. Service will only be billed when office clinical staff spend 20 minutes or more in a month to coordinate care. Only one practitioner may furnish and bill the service in a calendar month. The patient may stop CCM services at any time (effective at the end of the month) by phone call to the office staff. The patient will be responsible for cost sharing (co-pay) of up to 20% of the service fee (after annual deductible is met).  Patient agreed to services and verbal consent obtained.   Patient verbalizes understanding of instructions provided today and agrees to view in [place].   Telephone follow up appointment with care management team member scheduled for:  12/16/2020  Heart-Healthy Eating Plan  Heart-healthy meal planning includes: Eating less unhealthy fats. Eating more healthy fats. Making other changes in your diet. Talk with your doctor or a diet specialist (dietitian) to create an eating plan that is right for you. What is my plan? Your doctor may recommend an eating plan that includes: Total fat: ______% or less of total calories a day. Saturated fat: ______% or less of total calories a day. Cholesterol: less than _________mg a day. What are tips for following this plan? Cooking Avoid frying your food. Try to bake, boil, grill, or broil it instead. You can also reduce fat by: Removing the skin from poultry. Removing all visible fats from meats. Steaming vegetables in water or broth. Meal planning  At meals, divide your plate into four equal parts: Fill one-half of your plate with vegetables and green salads. Fill one-fourth of your plate with whole grains. Fill one-fourth of your plate with lean protein foods. Eat 4-5 servings of vegetables per day. A serving of vegetables is: 1 cup of  raw or cooked vegetables. 2 cups of raw leafy greens. Eat 4-5 servings of fruit per day. A serving of fruit is: 1 medium whole fruit.  cup of dried fruit.  cup of fresh, frozen, or canned fruit.  cup of 100% fruit juice. Eat more foods that have soluble fiber. These are apples, broccoli, carrots, beans, peas, and barley. Try to get 20-30 g of fiber per day. Eat 4-5 servings of nuts, legumes, and seeds per week: 1 serving of dried beans or legumes equals  cup after being cooked. 1 serving of nuts is  cup. 1 serving of seeds equals 1 tablespoon. General information Eat more home-cooked food. Eat less restaurant, buffet, and fast food. Limit or avoid alcohol. Limit foods that are high in starch and sugar. Avoid fried foods. Lose weight if you are overweight. Keep track of how much salt (sodium) you eat. This is important if you have high blood pressure. Ask your doctor to tell you more about this. Try to add vegetarian meals each week. Fats Choose healthy fats. These include olive oil and canola oil, flaxseeds, walnuts, almonds, and seeds. Eat more omega-3 fats. These include salmon, mackerel, sardines, tuna, flaxseed oil, and ground flaxseeds. Try to eat fish at least 2 times each week. Check food labels. Avoid foods with trans fats or high amounts of saturated fat. Limit saturated fats. These are often found in animal products, such as meats, butter, and cream. These are also found in plant foods, such as palm oil, palm kernel oil, and coconut oil. Avoid foods with partially hydrogenated oils in them. These have trans fats. Examples are stick margarine, some tub margarines, cookies, crackers, and other baked goods. What foods can I eat? Fruits All fresh, canned (in natural juice), or frozen fruits. Vegetables Fresh or frozen vegetables (raw, steamed, roasted, or grilled). Green salads. Grains Most grains. Choose whole wheat and whole grains most of the time. Rice and pasta,  including brown rice and pastas made with whole wheat. Meats and other proteins Lean, well-trimmed beef, veal, pork, and lamb. Chicken and [place] without skin. All fish and shellfish. Wild duck, rabbit, pheasant, and venison. Egg whites or low-cholesterol egg substitutes. Dried beans, peas, lentils, and tofu. Seeds and most nuts. Dairy Low-fat or nonfat cheeses, including ricotta and mozzarella. Skim or 1% milk that is liquid, powdered, or evaporated. Buttermilk that is made with low-fat milk. Nonfat or low-fat yogurt. Fats and oils Non-hydrogenated (trans-free) margarines. Vegetable oils, including soybean, sesame,  sunflower, olive, peanut, safflower, corn, canola, and cottonseed. Salad dressings or mayonnaise made with a vegetable oil. Beverages Mineral water. Coffee and tea. Diet carbonated beverages. Sweets and desserts Sherbet, gelatin, and fruit ice. Small amounts of dark chocolate. Limit all sweets and desserts. Seasonings and condiments All seasonings and condiments. The items listed above may not be a complete list of foods and drinks you can eat. Contact a dietitian for more options. What foods should I avoid? Fruits Canned fruit in heavy syrup. Fruit in cream or butter sauce. Fried fruit. Limit coconut. Vegetables Vegetables cooked in cheese, cream, or butter sauce. Fried vegetables. Grains Breads that are made with saturated or trans fats, oils, or whole milk. Croissants. Sweet rolls. Donuts. High-fat crackers, such as cheese crackers. Meats and other proteins Fatty meats, such as hot dogs, ribs, sausage, bacon, rib-eye roast or steak. High-fat deli meats, such as salami and bologna. Caviar. Domestic duck and goose. Organ meats, such as liver. Dairy Cream, sour cream, cream cheese, and creamed cottage cheese. Whole-milk cheeses. Whole or 2% milk that is liquid, evaporated, or condensed. Whole buttermilk. Cream sauce or high-fat cheese sauce. Yogurt that is made from whole  milk. Fats and oils Meat fat, or shortening. Cocoa butter, hydrogenated oils, palm oil, coconut oil, palm kernel oil. Solid fats and shortenings, including bacon fat, salt pork, lard, and butter. Nondairy cream substitutes. Salad dressings with cheese or sour cream. Beverages Regular sodas and juice drinks with added sugar. Sweets and desserts Frosting. Pudding. Cookies. Cakes. Pies. Milk chocolate or white chocolate. Buttered syrups. Full-fat ice cream or ice cream drinks. The items listed above may not be a complete list of foods and drinks to avoid. Contact a dietitian for more information. Summary Heart-healthy meal planning includes eating less unhealthy fats, eating more healthy fats, and making other changes in your diet. Eat a balanced diet. This includes fruits and vegetables, low-fat or nonfat dairy, lean protein, nuts and legumes, whole grains, and heart-healthy oils and fats. This information is not intended to replace advice given to you by your health care provider. Make sure you discuss any questions you have with your health care provider. Document Revised: 06/06/2020 Document Reviewed: 06/06/2020 Elsevier Patient Education  2022 [facility], BSN RN Case Manager [facility] [phone]   CLINICAL CARE PLAN: Patient Care Plan: Hyperlipidemia     Problem Identified: Health Promotion or Disease Self-Management (Hyperlipidemia)   Priority: Medium     Long-Range Goal: Self-Management Plan Developed for hyperlipidemia   Start Date: 10/18/2020  Expected End Date: 04/17/2021  This Visit's Progress: On track  Priority: Medium  Note:   Current Barriers:  Poorly controlled hyperlipidemia, complicated by not always following heart healthy diet Current antihyperlipidemic regimen: atorvastatin and ezetimibe Most recent lipid panel:     Component Value Date/Time   CHOL 149 05/03/2020 1012   TRIG 92 05/03/2020 1012   HDL 47 05/03/2020  1012   CHOLHDL 3.2 05/03/2020 1012   LDLCALC 83 05/03/2020 1012  Patient reports he lives with spouse, is independent with all aspects of his care, continues to drive, does not have or use DME, continues to be active and rides bicycle, works in his yard, does not have advanced directives and declines. ASCVD risk enhancing conditions: age >62 Does not adhere to provider recommendations re: following heart healthy diet RN Care Manager Clinical Goal(s):  patient will work with Consulting civil engineer, providers, and care team towards execution of optimized self-health management  plan patient will verbalize understanding of plan for hyperlipidemia management the patient will demonstrate ongoing self health care management ability Interventions: Collaboration with Susy Frizzle, MD regarding development and update of comprehensive plan of care as evidenced by provider attestation and co-signature Inter-disciplinary care team collaboration (see longitudinal plan of care) Medication review performed; medication list updated in electronic medical record.  Inter-disciplinary care team collaboration (see longitudinal plan of care) Provided education to patient and/or caregiver about advanced directives Provided education to patient re: heart healthy diet, food choices, importance of exercising daily Reviewed medications with patient and discussed importance of taking as presribed Reviewed scheduled/upcoming provider appointments including: 10/31 Dr. Gasper Sells (cardiologist) Discussed plans with patient for ongoing care management follow up and provided patient with direct contact information for care management team Education sent via My Chart heart healthy diet Patient Goals/Self-Care Activities: - discuss my treatment options with the doctor or nurse - make shared treatment decisions with doctor  - take all medications as prescribed - continue to exercise, ride bicycle and work in your yard -  follow heart healthy diet- avoid saturated and trans fats - look over information sent via My Chart- heart healthy diet - call for medicine refill 2 or 3 days before it runs out - keep a list of all the medicines I take; vitamins and herbals too - if I have chest pain, call for help - learn about small changes that will make a big difference - learn my personal risk factors Follow Up Plan: Telephone follow up appointment with care management team member scheduled for:  12/16/2020      Patient Care Plan: Coronary Artery Disease (Aortic Atherosclerosis)     Problem Identified: Disease Progression (Coronary Artery Disease)   Priority: Medium     Long-Range Goal: Disease Progression Prevented or Minimized   Start Date: 10/18/2020  Expected End Date: 04/17/2021  This Visit's Progress: On track  Priority: Medium  Note:   Current Barriers:  Ineffective Self Health Maintenance in a patient with CAD and HLD Does not adhere to provider recommendations re:   heart healthy diet Clinical Goal(s):  Collaboration with Susy Frizzle, MD regarding development and update of comprehensive plan of care as evidenced by provider attestation and co-signature Inter-disciplinary care team collaboration (see longitudinal plan of care) patient will work with care management team to address care coordination and chronic disease management needs related to Disease Management Educational Needs Care Coordination Medication Management and Education   Interventions:  Evaluation of current treatment plan related to CAD and HLD, self-management and patient's adherence to plan as established by provider. Collaboration with Susy Frizzle, MD regarding development and update of comprehensive plan of care as evidenced by provider attestation       and co-signature Inter-disciplinary care team collaboration (see longitudinal plan of care) Discussed plans with patient for ongoing care management follow up and  provided patient with direct contact information for care management team Reviewed importance of eating healthy, limiting fast food, following low sodium and heart healthy diet Encouraged patient to continue exercising daily Reviewed upcoming scheduled appointments Self Care Activities:  Self administers medications as prescribed Attends all scheduled provider appointments Calls provider office for new concerns or questions Patient Goals: - know and watch for signs of a heart attack - if I have chest pain, call for help - use nitroglycerin as prescribed - learn about small changes that will make a big difference - learn my personal risk factors  - follow up  with cardiologist 10/31 - take medications as prescribed - continue to exercise Follow Up Plan: Telephone follow up appointment with care management team member scheduled for:   12/16/2020

## 2020-11-08 DIAGNOSIS — E7800 Pure hypercholesterolemia, unspecified: Secondary | ICD-10-CM

## 2020-11-25 ENCOUNTER — Other Ambulatory Visit: Payer: Self-pay | Admitting: Family Medicine

## 2020-12-08 NOTE — Progress Notes (Signed)
Cardiology Office Note:    Date:  12/09/2020   ID:  Richard Beat., DOB 21-Dec-1950, MRN [id number]  PCP:  Susy Frizzle, MD   [facility] Providers Cardiologist:  Werner Lean, MD     Referring MD: Susy Frizzle, MD   CC: CAD f/u  History of Present Illness:    Richard Jawad. is a 70 y.o. male with a hx of HLD who presents for evaluation 08/14/20. In interim of this visit, patient had cath with OM2 disease that was two small for intervention.  Seen 09/03/20.  Since last visit no interval events.  Seen 12/09/20.  Patient notes that he is doing [place]- since the last weekend he has a few trunk-or treats and is tired from this.  Feels a bit muscle aches and aches better than on atorvastatin 40 mg.  No chest pain or pressure.  No SOB and no PND/Orthopnea.  DOE with hurrying up the stairs  No weight gain or leg swelling.  No palpitations or syncope .   Past Medical History:  Diagnosis Date   Arthritis    Brachial neuritis    Gastric ulcer    GI bleed    H pylori ulcer    Hypercholesteremia    Turner syndrome    left arm, some numbness in fingers    Past Surgical History:  Procedure Laterality Date   COLONOSCOPY WITH PROPOFOL N/A 03/05/2015   Procedure: COLONOSCOPY WITH PROPOFOL;  Surgeon: Garlan Fair, MD;  Location: [facility];  Service: Endoscopy;  Laterality: N/A;   ESOPHAGOGASTRODUODENOSCOPY  09/08/2011   Procedure: ESOPHAGOGASTRODUODENOSCOPY (EGD);  Surgeon: Winfield Cunas., MD;  Location: [facility];  Service: Endoscopy;  Laterality: N/A;   HERNIA REPAIR     INGUINAL HERNIA REPAIR Right 08/30/2013   Procedure: HERNIA REPAIR INGUINAL WITH MESH  ADULT;  Surgeon: Pedro Earls, MD;  Location: [facility];  Service: General;  Laterality: Right;   LEFT HEART CATH AND CORONARY ANGIOGRAPHY N/A 08/19/2020   Procedure: LEFT HEART CATH AND CORONARY ANGIOGRAPHY;  Surgeon: Martinique, Peter M, MD;  Location: [facility];  Service: Cardiovascular;   Laterality: N/A;   SHOULDER ARTHROSCOPY  2011   x4  one open rt, rotator cuff tear, Dr. Amedeo Plenty   TOTAL SHOULDER ARTHROPLASTY  01/30/2011   Procedure: TOTAL SHOULDER ARTHROPLASTY;  Surgeon: Augustin Schooling;  Location: [facility];  Service: Orthopedics;  Laterality: Right;  right total shoulder atrhoplasty    Current Medications: Current Meds  Medication Sig   acetaminophen (TYLENOL) 650 MG CR tablet Take 650 mg by mouth every 8 (eight) hours as needed for pain.   aspirin EC 81 MG tablet Take 81 mg by mouth daily. Swallow whole.   atorvastatin (LIPITOR) 20 MG tablet Take 1 tablet (20 mg total) by mouth daily.   diphenhydrAMINE (BENADRYL) 25 MG tablet Take 25 mg by mouth 2 (two) times daily.   ezetimibe (ZETIA) 10 MG tablet Take 1 tablet (10 mg total) by mouth daily.   fish oil-omega-3 fatty acids 1000 MG capsule Take 2 g by mouth daily.   furosemide (LASIX) 20 MG tablet Take 1 tablet (20 mg total) by mouth daily.   metoprolol tartrate (LOPRESSOR) 25 MG tablet Take 0.5 tablets (12.5 mg total) by mouth 2 (two) times daily.   Multiple Vitamin (MULTIVITAMIN WITH MINERALS) TABS tablet Take 1 tablet by mouth daily.   nitroGLYCERIN (NITROSTAT) 0.4 MG SL tablet Place 1 tablet (0.4 mg total) under the  tongue every 5 (five) minutes as needed for chest pain. You can take up to 3 tablets   pantoprazole (PROTONIX) 40 MG tablet TAKE 1 TABLET BY MOUTH TWICE A DAY BEFORE A MEAL   primidone (MYSOLINE) 50 MG tablet TAKE 2 TABLETS BY MOUTH AT BEDTIME.   tamsulosin (FLOMAX) 0.4 MG CAPS capsule TAKE 1 CAPSULE BY MOUTH EVERY DAY   TURMERIC PO Take 1 capsule by mouth daily.   vitamin C (ASCORBIC ACID) 500 MG tablet Take 500 mg by mouth daily.   Zinc 50 MG TABS Take 50 mg by mouth daily.     Allergies:   Monosodium glutamate and Latex   Social History   Socioeconomic History   Marital status: Married    Spouse name: Not on file   Number of children: Not on file   Years of education: Not on file   Highest  education level: Not on file  Occupational History   Not on file  Tobacco Use   Smoking status: Never   Smokeless tobacco: Never  Vaping Use   Vaping Use: Never used  Substance and Sexual Activity   Alcohol use: No   Drug use: No   Sexual activity: Not on file  Other Topics Concern   Not on file  Social History Narrative   Not on file   Social Determinants of Health   Financial Resource Strain: Not on file  Food Insecurity: No Food Insecurity   Worried About Running Out of Food in the Last Year: Never true   [place] in the Last Year: Never true  Transportation Needs: No Transportation Needs   Lack of Transportation (Medical): No   Lack of Transportation (Non-Medical): No  Physical Activity: Not on file  Stress: Not on file  Social Connections: Not on file    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 History of coronary artery disease notable for father with multiple bypass surgeries, mother, uncle, and grandfather.  ROS:   Please see the history of present illness.    All other systems reviewed and are negative.  EKGs/Labs/Other Studies Reviewed:    The following studies were reviewed today:  EKG:   08/14/20: SR rate 76 WNL  Transthoracic Echocardiogram Date:06/12/20 Results: 1. Left ventricular ejection fraction, by estimation, is 50 to 55%. The  left ventricle has low normal function. The left ventricle has no regional  wall motion abnormalities. Definity contrast agent was given IV to  delineate the left ventricular  endocardial borders. The left ventricular internal cavity size was normal  in size. There is no left ventricular hypertrophy. Left ventricular  diastolic parameters are consistent with Grade I diastolic dysfunction  (impaired relaxation).  2. The right ventricular size is normal. No increase in right ventricular  wall thickness.  Right ventricular systolic function is normal. There is  normal pulmonary artery systolic pressure. The tricuspid regurgitant  velocity is 2.29 m/s, and with an  assumed right atrial pressure of 8 mmHg, the estimated right ventricular  systolic pressure is 67.3 mmHg.  3. The mitral valve is abnormal. Mild to moderate mitral annular  calcification. Trivial mitral valve regurgitation. No evidence of mitral  valve stenosis.  4. The aortic valve was not well visualized. Aortic valve regurgitation is  not visualized. No aortic stenosis is present.  5. Prominent Chiari network seen in the subcostal.   NonCardiac CT: Date:12/16/2006 Results: LM, LAD  CAC and Aortic Atherosclerosis  Left/Right Heart Catheterizations: Date: 08/09/20 Results: Prox RCA to Mid RCA lesion is 60% stenosed. Ost RCA lesion is 50% stenosed. Dist LM to Ost LAD lesion is 25% stenosed. 2nd Mrg lesion is 80% stenosed. LPAV lesion is 40% stenosed with 40% stenosed side branch in LPDA. LV end diastolic pressure is mildly elevated.   1. Nonobstructive CAD except for a tiny second OM 2. Mildly elevated LVEDP   Recent Labs: 05/03/2020: ALT 21 08/14/2020: Hemoglobin 14.4; Platelets 235 09/10/2020: BUN 18; Creatinine, Ser 1.11; Potassium 4.1; Sodium 140  Recent Lipid Panel    Component Value Date/Time   CHOL 149 05/03/2020 1012   TRIG 92 05/03/2020 1012   HDL 47 05/03/2020 1012   CHOLHDL 3.2 05/03/2020 1012   LDLCALC 83 05/03/2020 1012    Risk Assessment/Calculations:     N/A      Physical Exam:    VS:  BP 130/70   Pulse 64   Ht 5\' 8"  (1.727 m)   Wt 200 lb 6.4 oz (90.9 kg)   SpO2 98%   BMI 30.47 kg/m     Wt Readings from Last 3 Encounters:  12/09/20 200 lb 6.4 oz (90.9 kg)  09/03/20 197 lb (89.4 kg)  08/19/20 194 lb (88 kg)     GEN:  Well nourished, well developed in no acute distress HEENT: R sided Frank's Sign NECK: No JVD LYMPHATICS: No lymphadenopathy CARDIAC: RRR, no murmurs, rubs,  gallops RESPIRATORY:  Clear to auscultation without rales, wheezing or rhonchi  ABDOMEN: Soft, non-tender, non-distended MUSCULOSKELETAL:  No edema; No deformity  SKIN: Warm and dry NEUROLOGIC:  Alert and oriented x 3; essential tremor in left arm stable from prior visits PSYCHIATRIC:  Normal affect   ASSESSMENT:    1. Coronary artery disease of native artery of native heart with stable angina pectoris ([place])   2. Aortic atherosclerosis ([place])   3. Hypercholesteremia      PLAN:    CAD Aortic atherosclerosis  Essential Tremor (mild left side only) -continue ASA 81 mg PO Daily -Will check lipids on atorvastatin 20 mg and Zetia 10; we have discussed diet changes; we discussed his fish oil; we discussed coming off the atorvastatin completely if at goal and lipids in three months given some residual aches and pains (some may have nothing to do with statins) -Continue metoprolol 12.5 mg PO BID - No PRN nitro  - will get BMP while on lasix 20 mg PO daily - may need magnesium draw given hx of hypomagnesemia and muscle aches   Will plan for six months follow up unless new symptoms or abnormal test results warranting change in plan  Would be reasonable for  APP Follow up    Medication Adjustments/Labs and Tests Ordered: Current medicines are reviewed at length with the patient today.  Concerns regarding medicines are outlined above.  Orders Placed This Encounter  Procedures   Lipid panel   Hepatic function panel   Basic metabolic panel      No orders of the defined types were placed in this encounter.     Patient Instructions  Medication Instructions:  Your physician recommends that you continue on your current medications as directed. Please refer to the Current Medication list given to you today.  *If you need a refill on your cardiac medications before your next appointment, please call your pharmacy*   Lab Work: TODAY: LFT, FLP, BMET   If you have labs (blood  work) drawn today and your  tests are completely normal, you will receive your results only by: MyChart Message (if you have MyChart) OR A paper copy in the mail If you have any lab test that is abnormal or we need to change your treatment, we will call you to review the results.   Follow-Up: At [facility], you and your health needs are our priority.  As part of our continuing mission to provide you with exceptional heart care, we have created designated Provider Care Teams.  These Care Teams include your primary Cardiologist (physician) and Advanced Practice Providers (APPs -  Physician Assistants and Nurse Practitioners) who all work together to provide you with the care you need, when you need it.   Your next appointment:   6 month(s)  The format for your next appointment:   In Person  Provider:   You may see Werner Lean, MD or one of the following Advanced Practice Providers on your designated Care Team:   Melina Copa, PA-C Ermalinda Barrios, PA-C     Signed, Werner Lean, MD  12/09/2020 9:09 AM    [place]

## 2020-12-09 ENCOUNTER — Encounter: Payer: Self-pay | Admitting: Internal Medicine

## 2020-12-09 ENCOUNTER — Other Ambulatory Visit: Payer: Self-pay

## 2020-12-09 ENCOUNTER — Ambulatory Visit: Payer: Medicare HMO | Admitting: Internal Medicine

## 2020-12-09 VITALS — BP 130/70 | HR 64 | Ht 68.0 in | Wt 200.4 lb

## 2020-12-09 DIAGNOSIS — M791 Myalgia, unspecified site: Secondary | ICD-10-CM

## 2020-12-09 DIAGNOSIS — G250 Essential tremor: Secondary | ICD-10-CM

## 2020-12-09 DIAGNOSIS — T466X5A Adverse effect of antihyperlipidemic and antiarteriosclerotic drugs, initial encounter: Secondary | ICD-10-CM

## 2020-12-09 DIAGNOSIS — T466X5D Adverse effect of antihyperlipidemic and antiarteriosclerotic drugs, subsequent encounter: Secondary | ICD-10-CM | POA: Diagnosis not present

## 2020-12-09 DIAGNOSIS — E7800 Pure hypercholesterolemia, unspecified: Secondary | ICD-10-CM | POA: Diagnosis not present

## 2020-12-09 DIAGNOSIS — I700 Atherosclerosis of aorta: Secondary | ICD-10-CM | POA: Diagnosis not present

## 2020-12-09 DIAGNOSIS — I25118 Atherosclerotic heart disease of native coronary artery with other forms of angina pectoris: Secondary | ICD-10-CM

## 2020-12-09 LAB — HEPATIC FUNCTION PANEL
ALT: 16 IU/L (ref 0–44)
AST: 23 IU/L (ref 0–40)
Albumin: 4.4 g/dL (ref 3.7–4.7)
Alkaline Phosphatase: 66 IU/L (ref 44–121)
Bilirubin Total: 0.4 mg/dL (ref 0.0–1.2)
Bilirubin, Direct: 0.12 mg/dL (ref 0.00–0.40)
Total Protein: 6.6 g/dL (ref 6.0–8.5)

## 2020-12-09 LAB — BASIC METABOLIC PANEL
BUN/Creatinine Ratio: 23 (ref 10–24)
BUN: 23 mg/dL (ref 8–27)
CO2: 25 mmol/L (ref 20–29)
Calcium: 9.3 mg/dL (ref 8.6–10.2)
Chloride: 104 mmol/L (ref 96–106)
Creatinine, Ser: 1.02 mg/dL (ref 0.76–1.27)
Glucose: 98 mg/dL (ref 70–99)
Potassium: 4.5 mmol/L (ref 3.5–5.2)
Sodium: 140 mmol/L (ref 134–144)
eGFR: 78 mL/min/{1.73_m2} (ref >59)

## 2020-12-09 LAB — LIPID PANEL
Chol/HDL Ratio: 4 ratio (ref 0.0–5.0)
Cholesterol, Total: 153 mg/dL (ref 100–199)
HDL: 38 mg/dL — ABNORMAL LOW (ref >39)
LDL Chol Calc (NIH): 89 mg/dL (ref 0–99)
Triglycerides: 147 mg/dL (ref 0–149)
VLDL Cholesterol Cal: 26 mg/dL (ref 5–40)

## 2020-12-09 NOTE — Patient Instructions (Addendum)
Medication Instructions:  Your physician recommends that you continue on your current medications as directed. Please refer to the Current Medication list given to you today.  *If you need a refill on your cardiac medications before your next appointment, please call your pharmacy*   Lab Work: TODAY: LFT, FLP, BMET   If you have labs (blood work) drawn today and your tests are completely normal, you will receive your results only by: [place] (if you have MyChart) OR A paper copy in the mail If you have any lab test that is abnormal or we need to change your treatment, we will call you to review the results.   Follow-Up: At [facility], you and your health needs are our priority.  As part of our continuing mission to provide you with exceptional heart care, we have created designated Provider Care Teams.  These Care Teams include your primary Cardiologist (physician) and Advanced Practice Providers (APPs -  Physician Assistants and Nurse Practitioners) who all work together to provide you with the care you need, when you need it.   Your next appointment:   6 month(s)  The format for your next appointment:   In Person  Provider:   You may see Werner Lean, MD or one of the following Advanced Practice Providers on your designated Care Team:   Melina Copa, PA-C Ermalinda Barrios, PA-C

## 2020-12-10 ENCOUNTER — Telehealth: Payer: Self-pay | Admitting: *Deleted

## 2020-12-10 DIAGNOSIS — E7800 Pure hypercholesterolemia, unspecified: Secondary | ICD-10-CM

## 2020-12-10 NOTE — Telephone Encounter (Signed)
-----   Message from Werner Lean, MD sent at 12/10/2020  8:32 AM EDT ----- Results: LDL above goal Plan: No increase in meds (see not from 12/09/20) Lipids in 4 months  Werner Lean, MD

## 2020-12-10 NOTE — Telephone Encounter (Signed)
The patient has been notified of the result and verbalized understanding.  All questions (if any) were answered. Darrell Jewel, RN 12/10/2020 9:24 AM    Ordered labs and scheduled appointment.

## 2020-12-16 ENCOUNTER — Telehealth: Payer: Medicare HMO

## 2020-12-23 ENCOUNTER — Ambulatory Visit (INDEPENDENT_AMBULATORY_CARE_PROVIDER_SITE_OTHER): Payer: Medicare HMO | Admitting: *Deleted

## 2020-12-23 DIAGNOSIS — I700 Atherosclerosis of aorta: Secondary | ICD-10-CM

## 2020-12-23 DIAGNOSIS — E7800 Pure hypercholesterolemia, unspecified: Secondary | ICD-10-CM

## 2020-12-23 NOTE — Chronic Care Management (AMB) (Signed)
Chronic Care Management   CCM RN Visit Note  12/23/2020 Name: Richard Arnold. MRN: [id number] DOB: [birth year]  Subjective: Richard Arnold. is a 70 y.o. year old male who is a primary care patient of Pickard, Cammie Mcgee, MD. The care management team was consulted for assistance with disease management and care coordination needs.    Engaged with patient by telephone for follow up visit in response to provider referral for case management and/or care coordination services.   Consent to Services:  The patient was given information about Chronic Care Management services, agreed to services, and gave verbal consent prior to initiation of services.  Please see initial visit note for detailed documentation.   Patient agreed to services and verbal consent obtained.   Assessment: Review of patient past medical history, allergies, medications, health status, including review of consultants reports, laboratory and other test data, was performed as part of comprehensive evaluation and provision of chronic care management services.   SDOH (Social Determinants of Health) assessments and interventions performed:    CCM Care Plan  Allergies  Allergen Reactions   Monosodium Glutamate Swelling    Rash, throat swells   Latex Rash    Outpatient Encounter Medications as of 12/23/2020  Medication Sig   acetaminophen (TYLENOL) 650 MG CR tablet Take 650 mg by mouth every 8 (eight) hours as needed for pain.   aspirin EC 81 MG tablet Take 81 mg by mouth daily. Swallow whole.   atorvastatin (LIPITOR) 20 MG tablet Take 1 tablet (20 mg total) by mouth daily.   diphenhydrAMINE (BENADRYL) 25 MG tablet Take 25 mg by mouth 2 (two) times daily.   ezetimibe (ZETIA) 10 MG tablet Take 1 tablet (10 mg total) by mouth daily.   fish oil-omega-3 fatty acids 1000 MG capsule Take 2 g by mouth daily.   furosemide (LASIX) 20 MG tablet Take 1 tablet (20 mg total) by mouth daily.   metoprolol tartrate (LOPRESSOR) 25 MG  tablet Take 0.5 tablets (12.5 mg total) by mouth 2 (two) times daily.   Multiple Vitamin (MULTIVITAMIN WITH MINERALS) TABS tablet Take 1 tablet by mouth daily.   pantoprazole (PROTONIX) 40 MG tablet TAKE 1 TABLET BY MOUTH TWICE A DAY BEFORE A MEAL   primidone (MYSOLINE) 50 MG tablet TAKE 2 TABLETS BY MOUTH AT BEDTIME.   tamsulosin (FLOMAX) 0.4 MG CAPS capsule TAKE 1 CAPSULE BY MOUTH EVERY DAY   TURMERIC PO Take 1 capsule by mouth daily.   vitamin C (ASCORBIC ACID) 500 MG tablet Take 500 mg by mouth daily.   Zinc 50 MG TABS Take 50 mg by mouth daily.   nitroGLYCERIN (NITROSTAT) 0.4 MG SL tablet Place 1 tablet (0.4 mg total) under the tongue every 5 (five) minutes as needed for chest pain. You can take up to 3 tablets (Patient not taking: Reported on 12/23/2020)   No facility-administered encounter medications on file as of 12/23/2020.    Patient Active Problem List   Diagnosis Date Noted   Coronary artery disease of native artery of native heart with stable angina pectoris ([place]) 09/03/2020   Myalgia due to statin 09/03/2020   Aortic atherosclerosis ([place]) 08/14/2020   Essential tremor 08/14/2020   Hypomagnesemia 05/20/2018   Gastrointestinal hemorrhage with melena 05/19/2018   Hypercholesteremia 05/19/2018   Insomnia 05/19/2018   S/P right inguinal hernia repair July 2015 08/30/2013   [place]-Turner syndrome-left upper extremity 08/22/2013   Osteoarthrosis, unspecified whether generalized or localized, shoulder region 01/30/2011    Conditions to  be addressed/monitored:CAD and HLD  Care Plan : RN Care Manager plan of care  Updates made by Kassie Mends, RN since 12/23/2020 12:00 AM     Problem: No plan of care established for management of chronic disease states (HLD, aortic atherosclerosis)   Priority: High     Long-Range Goal: Development of plan of care for chronic disease management (HLD, aortic atherosclerosis)   Start Date: 12/23/2020  Expected End Date: 06/21/2021   Priority: High  Note:   Current Barriers:  Knowledge Deficits related to plan of care for management of CAD and HLD     Component Value Date/Time   CHOL 149 05/03/2020 1012   TRIG 92 05/03/2020 1012   HDL 47 05/03/2020 1012   CHOLHDL 3.2 05/03/2020 1012   LDLCALC 83 05/03/2020 1012  Hyperlipidemia, complicated by not always following heart healthy diet Current antihyperlipidemic regimen: atorvastatin and ezetimibe Patient reports he lives with spouse, is independent with all aspects of his care, continues to drive, does not have or use DME, continues to be active and rides bicycle, works in his yard, does not have advanced directives and declines. ASCVD risk enhancing conditions: age >47 Does not adhere to provider recommendations re: following heart healthy diet Patient reports he continues to ride his bicycle outdoors, tries to eat healthy but it is difficult,  pt reports he has not received flu vaccine and will consider it  RNCM Clinical Goal(s):  Patient will verbalize understanding of plan for management of CAD and HLD as evidenced by patient report, review of EHR attend all scheduled medical appointments: no scheduled doctor's appointments at present  as evidenced by pt report, review of EHR and        through collaboration with RN Care manager, provider, and care team.   Interventions: 1:1 collaboration with primary care provider regarding development and update of comprehensive plan of care as evidenced by provider attestation and co-signature Inter-disciplinary care team collaboration (see longitudinal plan of care) Evaluation of current treatment plan related to  self management and patient's adherence to plan as established by provider  Hyperlipidemia Interventions: Medication review performed; medication list updated in electronic medical record.  Counseled on importance of regular laboratory monitoring as prescribed; Reviewed role and benefits of statin for ASCVD risk  reduction; Reviewed importance of limiting foods high in cholesterol; Reviewed importance of continuing to exercise/ ride bicycle  CAD Interventions:  Assessed understanding of CAD diagnosis Medications reviewed including medications utilized in CAD treatment plan Provided education on importance of blood pressure control in management of CAD; Provided education on Importance of limiting foods high in cholesterol; Counseled on importance of regular laboratory monitoring as prescribed; Reviewed Importance of taking all medications as prescribed Reviewed Importance of attending all scheduled provider appointments    Patient Goals/Self-Care Activities: Patient will self administer medications as prescribed as evidenced by self report/primary caregiver report  Patient will attend all scheduled provider appointments as evidenced by clinician review of documented attendance to scheduled appointments and patient/caregiver report Patient will continue to perform ADL's independently as evidenced by patient/caregiver report Patient will continue to perform IADL's independently as evidenced by patient/caregiver report Patient will call provider office for new concerns or questions as evidenced by review of documented incoming telephone call notes and patient report Continue to get outside and ride your bicycle, yard work [employer] or broil foods instead of frying Follow low sodium diet  Schedule your flu vaccine  Follow Up Plan:  Telephone follow up appointment with care management  team member scheduled for:  03/24/2021       Plan:Telephone follow up appointment with care management team member scheduled for:  03/24/2021  Jacqlyn Larsen [facility], BSN RN Case Manager [place] [facility] [phone]

## 2020-12-23 NOTE — Patient Instructions (Addendum)
Visit Information  Continue to get outside and ride your bicycle, yard work Bake or broil foods instead of frying Follow low sodium diet  Schedule your flu vaccine  Patient verbalizes understanding of instructions provided today and agrees to view in [place].   Telephone follow up appointment with care management team member scheduled for:  03/24/2021  Jacqlyn Larsen [facility], BSN RN Case Manager [place] [facility] [phone]

## 2020-12-24 DIAGNOSIS — Z23 Encounter for immunization: Secondary | ICD-10-CM | POA: Diagnosis not present

## 2021-01-08 DIAGNOSIS — E7800 Pure hypercholesterolemia, unspecified: Secondary | ICD-10-CM

## 2021-01-10 DIAGNOSIS — D122 Benign neoplasm of ascending colon: Secondary | ICD-10-CM | POA: Diagnosis not present

## 2021-01-10 DIAGNOSIS — Z86010 Personal history of colonic polyps: Secondary | ICD-10-CM | POA: Diagnosis not present

## 2021-01-10 DIAGNOSIS — K5730 Diverticulosis of large intestine without perforation or abscess without bleeding: Secondary | ICD-10-CM | POA: Diagnosis not present

## 2021-01-10 DIAGNOSIS — D123 Benign neoplasm of transverse colon: Secondary | ICD-10-CM | POA: Diagnosis not present

## 2021-01-14 DIAGNOSIS — D122 Benign neoplasm of ascending colon: Secondary | ICD-10-CM | POA: Diagnosis not present

## 2021-01-14 DIAGNOSIS — D123 Benign neoplasm of transverse colon: Secondary | ICD-10-CM | POA: Diagnosis not present

## 2021-02-17 DIAGNOSIS — Z85828 Personal history of other malignant neoplasm of skin: Secondary | ICD-10-CM | POA: Diagnosis not present

## 2021-02-17 DIAGNOSIS — D485 Neoplasm of uncertain behavior of skin: Secondary | ICD-10-CM | POA: Diagnosis not present

## 2021-02-17 DIAGNOSIS — L820 Inflamed seborrheic keratosis: Secondary | ICD-10-CM | POA: Diagnosis not present

## 2021-02-17 DIAGNOSIS — L821 Other seborrheic keratosis: Secondary | ICD-10-CM | POA: Diagnosis not present

## 2021-02-17 DIAGNOSIS — L570 Actinic keratosis: Secondary | ICD-10-CM | POA: Diagnosis not present

## 2021-02-17 DIAGNOSIS — L989 Disorder of the skin and subcutaneous tissue, unspecified: Secondary | ICD-10-CM | POA: Diagnosis not present

## 2021-02-17 DIAGNOSIS — L814 Other melanin hyperpigmentation: Secondary | ICD-10-CM | POA: Diagnosis not present

## 2021-02-18 ENCOUNTER — Other Ambulatory Visit: Payer: Self-pay | Admitting: Family Medicine

## 2021-03-06 DIAGNOSIS — D485 Neoplasm of uncertain behavior of skin: Secondary | ICD-10-CM | POA: Diagnosis not present

## 2021-03-06 DIAGNOSIS — L988 Other specified disorders of the skin and subcutaneous tissue: Secondary | ICD-10-CM | POA: Diagnosis not present

## 2021-03-18 ENCOUNTER — Other Ambulatory Visit: Payer: Self-pay

## 2021-03-18 MED ORDER — PANTOPRAZOLE SODIUM 40 MG PO TBEC: DELAYED_RELEASE_TABLET | ORAL | 0 refills | Status: DC

## 2021-03-24 ENCOUNTER — Ambulatory Visit (INDEPENDENT_AMBULATORY_CARE_PROVIDER_SITE_OTHER): Payer: Medicare HMO | Admitting: *Deleted

## 2021-03-24 DIAGNOSIS — E7800 Pure hypercholesterolemia, unspecified: Secondary | ICD-10-CM

## 2021-03-24 DIAGNOSIS — I700 Atherosclerosis of aorta: Secondary | ICD-10-CM

## 2021-03-24 NOTE — Chronic Care Management (AMB) (Signed)
Chronic Care Management   CCM RN Visit Note  03/24/2021 Name: Richard Arnold. MRN: [id number] DOB: September 23, 1950  Subjective: Richard Arnold. is a 71 y.o. year old male who is a primary care patient of Pickard, Cammie Mcgee, MD. The care management team was consulted for assistance with disease management and care coordination needs.    Engaged with patient by telephone for follow up visit in response to provider referral for case management and/or care coordination services.   Consent to Services:  The patient was given information about Chronic Care Management services, agreed to services, and gave verbal consent prior to initiation of services.  Please see initial visit note for detailed documentation.   Patient agreed to services and verbal consent obtained.   Assessment: Review of patient past medical history, allergies, medications, health status, including review of consultants reports, laboratory and other test data, was performed as part of comprehensive evaluation and provision of chronic care management services.   SDOH (Social Determinants of Health) assessments and interventions performed:    CCM Care Plan  Allergies  Allergen Reactions   Monosodium Glutamate Swelling    Rash, throat swells   Latex Rash    Outpatient Encounter Medications as of 03/24/2021  Medication Sig   acetaminophen (TYLENOL) 650 MG CR tablet Take 650 mg by mouth every 8 (eight) hours as needed for pain.   aspirin EC 81 MG tablet Take 81 mg by mouth daily. Swallow whole.   atorvastatin (LIPITOR) 20 MG tablet Take 1 tablet (20 mg total) by mouth daily.   diphenhydrAMINE (BENADRYL) 25 MG tablet Take 25 mg by mouth 2 (two) times daily.   ezetimibe (ZETIA) 10 MG tablet Take 1 tablet (10 mg total) by mouth daily.   fish oil-omega-3 fatty acids 1000 MG capsule Take 2 g by mouth daily.   furosemide (LASIX) 20 MG tablet Take 1 tablet (20 mg total) by mouth daily.   metoprolol tartrate (LOPRESSOR) 25 MG  tablet Take 0.5 tablets (12.5 mg total) by mouth 2 (two) times daily.   Multiple Vitamin (MULTIVITAMIN WITH MINERALS) TABS tablet Take 1 tablet by mouth daily.   pantoprazole (PROTONIX) 40 MG tablet TAKE 1 TABLET BY MOUTH TWICE A DAY BEFORE A MEAL   primidone (MYSOLINE) 50 MG tablet TAKE 2 TABLETS BY MOUTH AT BEDTIME.   tamsulosin (FLOMAX) 0.4 MG CAPS capsule TAKE 1 CAPSULE BY MOUTH EVERY DAY   TURMERIC PO Take 1 capsule by mouth daily.   vitamin C (ASCORBIC ACID) 500 MG tablet Take 500 mg by mouth daily.   Zinc 50 MG TABS Take 50 mg by mouth daily.   nitroGLYCERIN (NITROSTAT) 0.4 MG SL tablet Place 1 tablet (0.4 mg total) under the tongue every 5 (five) minutes as needed for chest pain. You can take up to 3 tablets (Patient not taking: Reported on 12/23/2020)   No facility-administered encounter medications on file as of 03/24/2021.    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 08/22/2013   Osteoarthrosis, unspecified whether generalized or localized, shoulder region 01/30/2011    Conditions to  be addressed/monitored: HLD and aortic atherosclerosis  Care Plan : RN Care Manager plan of care  Updates made by Kassie Mends, RN since 03/24/2021 12:00 AM     Problem: No plan of care established for management of chronic disease states (HLD, aortic atherosclerosis)   Priority: High     Long-Range Goal: Development of plan of care for chronic disease management (HLD, aortic atherosclerosis)   Start Date: 12/23/2020  Expected End  Date: 06/21/2021  Priority: High  Note:   Current Barriers:  Knowledge Deficits related to plan of care for management of CAD and HLD     Component Value Date/Time   CHOL 149 05/03/2020 1012   TRIG 92 05/03/2020 1012   HDL 47 05/03/2020 1012   CHOLHDL 3.2 05/03/2020 1012   LDLCALC 83 05/03/2020 1012  Hyperlipidemia, complicated by not always following heart healthy diet Current antihyperlipidemic regimen: atorvastatin and ezetimibe Patient reports he lives with spouse, is independent with all aspects of his care, continues to drive, does not have or use DME, continues to be active and rides bicycle, works in his yard, does not have advanced directives and declines. ASCVD risk enhancing conditions: age >54 Does not adhere to provider recommendations re: following heart healthy diet Patient reports he is getting started back trying to ride bicycle outdoors now with warmer days, tries to eat healthy but it is difficult,  pt reports did receive flu vaccine   RNCM Clinical Goal(s):  Patient will verbalize understanding of plan for management of CAD and HLD as evidenced by patient report, review of EHR attend all scheduled medical appointments: no scheduled doctor's appointments at present  as evidenced by pt report, review of EHR and        through collaboration with RN Care manager, provider, and care team.   Interventions: 1:1 collaboration with primary care provider regarding development and update of comprehensive plan of care as evidenced by provider attestation and co-signature Inter-disciplinary care team collaboration (see longitudinal plan of care) Evaluation of current treatment plan related to  self management and patient's adherence to plan as established by provider  Hyperlipidemia Interventions: Medication review performed; medication list updated in electronic medical record.  Counseled on importance of regular laboratory monitoring as prescribed Reviewed importance of  limiting foods high in cholesterol Reviewed importance of continuing to exercise/ ride bicycle Pain assessment completed Reviewed upcoming scheduled appointments  CAD Interventions:  Assessed understanding of CAD diagnosis Medications reviewed including medications utilized in CAD treatment plan Provided education on importance of blood pressure control in management of CAD Provided education on Importance of limiting foods high in cholesterol Reviewed Importance of taking all medications as prescribed Reviewed Importance of attending all scheduled provider appointments    Patient Goals/Self-Care Activities: Take medications as prescribed   Attend all scheduled provider appointments Perform all self care activities independently  Perform IADL's (shopping, preparing meals, housekeeping, managing finances) independently Call provider office for new concerns or questions  Continue to get outside and ride your bicycle, yard work [employer] or broil foods instead of frying Follow low sodium diet and heart healthy diet Follow up with primary care provider 05/05/21 and labwork for cardiology on 04/11/21       Plan:Telephone follow up appointment with care management team member scheduled for:  06/23/21  Jacqlyn Larsen [facility], BSN RN Case Manager [facility] [phone]

## 2021-03-24 NOTE — Patient Instructions (Signed)
Visit Information  Thank you for taking time to visit with me today. Please don't hesitate to contact me if I can be of assistance to you before our next scheduled telephone appointment.  Following are the goals we discussed today:  Take medications as prescribed   Attend all scheduled provider appointments Perform all self care activities independently  Perform IADL's (shopping, preparing meals, housekeeping, managing finances) independently Call provider office for new concerns or questions  Continue to get outside and ride your bicycle, yard work [employer] or broil foods instead of frying Follow low sodium diet and heart healthy diet Follow up with primary care provider 05/05/21 and labwork for cardiology on 04/11/21  Our next appointment is by telephone on 06/23/21 at 9 am  Please call the care guide team at [phone] if you need to cancel or reschedule your appointment.   If you are experiencing a Mental Health or [place] or need someone to talk to, please call the [place] National Suicide Prevention Lifeline: 423-208-0781 or TTY: (847)284-7119 TTY [phone]) to talk to a trained counselor call 1-800-273-TALK (toll free, 24 hour hotline) go to [facility] [address], [place] [phone]) call 911   Patient verbalizes understanding of instructions and care plan provided today and agrees to view in [place]. Active MyChart status confirmed with patient.    Jacqlyn Larsen RNC, BSN RN Case Manager [facility] [phone]

## 2021-04-08 DIAGNOSIS — E7800 Pure hypercholesterolemia, unspecified: Secondary | ICD-10-CM

## 2021-04-08 DIAGNOSIS — I2510 Atherosclerotic heart disease of native coronary artery without angina pectoris: Secondary | ICD-10-CM | POA: Diagnosis not present

## 2021-04-11 ENCOUNTER — Other Ambulatory Visit: Payer: Medicare HMO | Admitting: *Deleted

## 2021-04-11 ENCOUNTER — Other Ambulatory Visit: Payer: Self-pay

## 2021-04-11 DIAGNOSIS — E7800 Pure hypercholesterolemia, unspecified: Secondary | ICD-10-CM | POA: Diagnosis not present

## 2021-04-11 LAB — LIPID PANEL
Chol/HDL Ratio: 4 ratio (ref 0.0–5.0)
Cholesterol, Total: 132 mg/dL (ref 100–199)
HDL: 33 mg/dL — ABNORMAL LOW (ref >39)
LDL Chol Calc (NIH): 70 mg/dL (ref 0–99)
Triglycerides: 171 mg/dL — ABNORMAL HIGH (ref 0–149)
VLDL Cholesterol Cal: 29 mg/dL (ref 5–40)

## 2021-05-05 ENCOUNTER — Encounter: Payer: Self-pay | Admitting: Family Medicine

## 2021-05-05 ENCOUNTER — Ambulatory Visit (INDEPENDENT_AMBULATORY_CARE_PROVIDER_SITE_OTHER): Payer: Medicare HMO | Admitting: Family Medicine

## 2021-05-05 ENCOUNTER — Other Ambulatory Visit: Payer: Self-pay

## 2021-05-05 VITALS — BP 124/78 | HR 74 | Temp 97.2°F | Ht 68.0 in | Wt 206.6 lb

## 2021-05-05 DIAGNOSIS — Z125 Encounter for screening for malignant neoplasm of prostate: Secondary | ICD-10-CM

## 2021-05-05 DIAGNOSIS — E7800 Pure hypercholesterolemia, unspecified: Secondary | ICD-10-CM

## 2021-05-05 DIAGNOSIS — Z0001 Encounter for general adult medical examination with abnormal findings: Secondary | ICD-10-CM | POA: Diagnosis not present

## 2021-05-05 DIAGNOSIS — I2510 Atherosclerotic heart disease of native coronary artery without angina pectoris: Secondary | ICD-10-CM | POA: Diagnosis not present

## 2021-05-05 DIAGNOSIS — R5383 Other fatigue: Secondary | ICD-10-CM | POA: Diagnosis not present

## 2021-05-05 DIAGNOSIS — Z0000 Encounter for general adult medical examination without abnormal findings: Secondary | ICD-10-CM

## 2021-05-05 NOTE — Progress Notes (Signed)
? ?Subjective:  ? ? Patient ID: Richard Beat., male    DOB: 1951/01/28, 71 y.o.   MRN: [id number] ? ?HPI ?Patient is here today for complete physical exam.  He states that he had a colonoscopy last year.  I do not have records from this however he states that he had a few polyps but that his colonoscopy is up-to-date.  He is due for prostate cancer screening.  He is due for Shingrix, tetanus shot, and a COVID booster.  We discussed the benefits and risk of each of these injections.  I recommended the shingles vaccine.  He decided to defer on the tetanus shot.  He will consider the COVID shot.  He does report fatigue.  He is taking Benadryl on a daily basis.  I recommended that he switch to a nonsedating antihistamine such as Claritin or Allegra to see if this could be causing some of his fatigue.  Last year he had an echocardiogram that showed an ejection fraction of 50 to 55%.  Catheterization revealed nonobstructive coronary artery disease in several different arteries.  He did have an 80% blockage in the marginal artery with 2 small.  However he denies any chest pain or shortness of breath or angina.  He is on atorvastatin along with Zetia.  His last cholesterol was checked in March and his LDL cholesterol was well below 70.  He is due for prostate cancer screening.  He has never had his B12, thyroid, or his testosterone level checked. ?Past Medical History:  ?Diagnosis Date  ? Arthritis   ? Brachial neuritis   ? Gastric ulcer   ? GI bleed   ? H pylori ulcer   ? Hypercholesteremia   ? Turner syndrome   ? left arm, some numbness in fingers  ? ?Past Surgical History:  ?Procedure Laterality Date  ? COLONOSCOPY WITH PROPOFOL N/A 03/05/2015  ? Procedure: COLONOSCOPY WITH PROPOFOL;  Surgeon: Garlan Fair, MD;  Location: [facility];  Service: Endoscopy;  Laterality: N/A;  ? ESOPHAGOGASTRODUODENOSCOPY  09/08/2011  ? Procedure: ESOPHAGOGASTRODUODENOSCOPY (EGD);  Surgeon: Winfield Cunas., MD;  Location: [facility];  Service: Endoscopy;  Laterality: N/A;  ? HERNIA REPAIR    ? INGUINAL HERNIA REPAIR Right 08/30/2013  ? Procedure: HERNIA REPAIR INGUINAL WITH MESH  ADULT;  Surgeon: Pedro Earls, MD;  Location: [facility];  Service: General;  Laterality: Right;  ? LEFT HEART CATH AND CORONARY ANGIOGRAPHY N/A 08/19/2020  ? Procedure: LEFT HEART CATH AND CORONARY ANGIOGRAPHY;  Surgeon: Martinique, Peter M, MD;  Location: [facility];  Service: Cardiovascular;  Laterality: N/A;  ? SHOULDER ARTHROSCOPY  2011  ? x4  one open rt, rotator cuff tear, Dr. Amedeo Plenty  ? TOTAL SHOULDER ARTHROPLASTY  01/30/2011  ? Procedure: TOTAL SHOULDER ARTHROPLASTY;  Surgeon: Augustin Schooling;  Location: [facility];  Service: Orthopedics;  Laterality: Right;  right total shoulder atrhoplasty  ? ?Current Outpatient Medications on File Prior to Visit  ?Medication Sig Dispense Refill  ? acetaminophen (TYLENOL) 650 MG CR tablet Take 650 mg by mouth every 8 (eight) hours as needed for pain.    ? aspirin EC 81 MG tablet Take 81 mg by mouth daily. Swallow whole.    ? atorvastatin (LIPITOR) 20 MG tablet Take 1 tablet (20 mg total) by mouth daily. 90 tablet 3  ? diphenhydrAMINE (BENADRYL) 25 MG tablet Take 25 mg by mouth 2 (two) times daily.    ? ezetimibe (ZETIA) 10 MG tablet Take  1 tablet (10 mg total) by mouth daily. 90 tablet 3  ? fish oil-omega-3 fatty acids 1000 MG capsule Take 2 g by mouth daily.    ? furosemide (LASIX) 20 MG tablet Take 1 tablet (20 mg total) by mouth daily. 90 tablet 3  ? metoprolol tartrate (LOPRESSOR) 25 MG tablet Take 0.5 tablets (12.5 mg total) by mouth 2 (two) times daily. 90 tablet 3  ? Multiple Vitamin (MULTIVITAMIN WITH MINERALS) TABS tablet Take 1 tablet by mouth daily.    ? nitroGLYCERIN (NITROSTAT) 0.4 MG SL tablet Place 1 tablet (0.4 mg total) under the tongue every 5 (five) minutes as needed for chest pain. You can take up to 3 tablets 30 tablet 3  ? pantoprazole (PROTONIX) 40 MG tablet TAKE 1 TABLET BY MOUTH TWICE A DAY  BEFORE A MEAL 30 tablet 0  ? primidone (MYSOLINE) 50 MG tablet TAKE 2 TABLETS BY MOUTH AT BEDTIME. 180 tablet 1  ? tamsulosin (FLOMAX) 0.4 MG CAPS capsule TAKE 1 CAPSULE BY MOUTH EVERY DAY 90 capsule 0  ? TURMERIC PO Take 1 capsule by mouth daily.    ? vitamin C (ASCORBIC ACID) 500 MG tablet Take 500 mg by mouth daily.    ? Zinc 50 MG TABS Take 50 mg by mouth daily.    ? ?No current facility-administered medications on file prior to visit.  ? ?Allergies  ?Allergen Reactions  ? Monosodium Glutamate Swelling  ?  Rash, throat swells  ? Latex Rash  ? ?Social History  ? ?Socioeconomic History  ? Marital status: Married  ?  Spouse name: Not on file  ? Number of children: Not on file  ? Years of education: Not on file  ? Highest education level: Not on file  ?Occupational History  ? Not on file  ?Tobacco Use  ? Smoking status: Never  ? Smokeless tobacco: Never  ?Vaping Use  ? Vaping Use: Never used  ?Substance and Sexual Activity  ? Alcohol use: No  ? Drug use: No  ? Sexual activity: Not on file  ?Other Topics Concern  ? Not on file  ?Social History Narrative  ? Not on file  ? ?Social Determinants of Health  ? ?Financial Resource Strain: Not on file  ?Food Insecurity: No Food Insecurity  ? Worried About Charity fundraiser in the Last Year: Never true  ? Ran Out of Food in the Last Year: Never true  ?Transportation Needs: No Transportation Needs  ? Lack of Transportation (Medical): No  ? Lack of Transportation (Non-Medical): No  ?Physical Activity: Not on file  ?Stress: Not on file  ?Social Connections: Not on file  ?Intimate Partner Violence: Not on file  ? ?Family History  ?Problem Relation Age of Onset  ? Heart disease Mother   ? Heart disease Father   ? ?Family history is significant for a history of CABG in his father later in life ? ? ?Review of Systems  ?All other systems reviewed and are negative. ? ?   ?Objective:  ? Physical Exam ?Vitals reviewed.  ?Constitutional:   ?   General: He is not in acute distress. ?    Appearance: He is well-developed. He is not diaphoretic.  ?HENT:  ?   Head: Normocephalic and atraumatic.  ?   Right Ear: External ear normal.  ?   Left Ear: External ear normal.  ?   Nose: Nose normal.  ?   Mouth/Throat:  ?   Pharynx: No oropharyngeal exudate.  ?Eyes:  ?  General: No scleral icterus.    ?   Right eye: No discharge.     ?   Left eye: No discharge.  ?   Conjunctiva/sclera: Conjunctivae normal.  ?   Pupils: Pupils are equal, round, and reactive to light.  ?Neck:  ?   Thyroid: No thyromegaly.  ?   Vascular: No JVD.  ?   Trachea: No tracheal deviation.  ?Cardiovascular:  ?   Rate and Rhythm: Normal rate and regular rhythm.  ?   Heart sounds: Normal heart sounds. No murmur heard. ?  No friction rub. No gallop.  ?Pulmonary:  ?   Effort: Pulmonary effort is normal. No respiratory distress.  ?   Breath sounds: Normal breath sounds. No stridor. No wheezing or rales.  ?Chest:  ?   Chest wall: No tenderness.  ?Abdominal:  ?   General: Bowel sounds are normal. There is no distension.  ?   Palpations: Abdomen is soft. There is no mass.  ?   Tenderness: There is no abdominal tenderness. There is no guarding or rebound.  ?Genitourinary: ?   Penis: Normal.   ?   Prostate: Normal.  ?   Rectum: Normal.  ?Musculoskeletal:     ?   General: No tenderness or deformity. Normal range of motion.  ?   Cervical back: Normal range of motion and neck supple.  ?Lymphadenopathy:  ?   Cervical: No cervical adenopathy.  ?Skin: ?   General: Skin is warm.  ?   Coloration: Skin is not pale.  ?   Findings: No erythema or rash.  ?Neurological:  ?   Mental Status: He is alert and oriented to person, place, and time.  ?   Cranial Nerves: No cranial nerve deficit.  ?   Motor: No abnormal muscle tone.  ?   Coordination: Coordination normal.  ?   Deep Tendon Reflexes: Reflexes are normal and symmetric.  ?Psychiatric:     ?   Behavior: Behavior normal.     ?   Thought Content: Thought content normal.     ?   Judgment: Judgment normal.   ? ? ? ? ? ?   ?Assessment & Plan:  ?Prostate cancer screening - Plan: PSA ? ?General medical exam ? ?Hypercholesteremia ? ?Coronary artery disease involving native coronary artery of native heart without angina pe

## 2021-05-06 LAB — COMPLETE METABOLIC PANEL WITH GFR
AG Ratio: 1.8 (calc) (ref 1.0–2.5)
ALT: 18 U/L (ref 9–46)
AST: 19 U/L (ref 10–35)
Albumin: 4.4 g/dL (ref 3.6–5.1)
Alkaline phosphatase (APISO): 57 U/L (ref 35–144)
BUN: 18 mg/dL (ref 7–25)
CO2: 29 mmol/L (ref 20–32)
Calcium: 9.3 mg/dL (ref 8.6–10.3)
Chloride: 103 mmol/L (ref 98–110)
Creat: 1.07 mg/dL (ref 0.70–1.28)
Globulin: 2.5 g/dL (calc) (ref 1.9–3.7)
Glucose, Bld: 90 mg/dL (ref 65–99)
Potassium: 4.7 mmol/L (ref 3.5–5.3)
Sodium: 138 mmol/L (ref 135–146)
Total Bilirubin: 0.4 mg/dL (ref 0.2–1.2)
Total Protein: 6.9 g/dL (ref 6.1–8.1)
eGFR: 74 mL/min/{1.73_m2} (ref >=60)

## 2021-05-06 LAB — CBC WITH DIFFERENTIAL/PLATELET
Absolute Monocytes: 493 cells/uL (ref 200–950)
Basophils Absolute: 48 cells/uL (ref 0–200)
Basophils Relative: 0.9 %
Eosinophils Absolute: 223 cells/uL (ref 15–500)
Eosinophils Relative: 4.2 %
HCT: 41.9 % (ref 38.5–50.0)
Hemoglobin: 14.2 g/dL (ref 13.2–17.1)
Lymphs Abs: 1585 cells/uL (ref 850–3900)
MCH: 31.3 pg (ref 27.0–33.0)
MCHC: 33.9 g/dL (ref 32.0–36.0)
MCV: 92.3 fL (ref 80.0–100.0)
MPV: 10.8 fL (ref 7.5–12.5)
Monocytes Relative: 9.3 %
Neutro Abs: 2952 cells/uL (ref 1500–7800)
Neutrophils Relative %: 55.7 %
Platelets: 235 10*3/uL (ref 140–400)
RBC: 4.54 10*6/uL (ref 4.20–5.80)
RDW: 13 % (ref 11.0–15.0)
Total Lymphocyte: 29.9 %
WBC: 5.3 10*3/uL (ref 3.8–10.8)

## 2021-05-06 LAB — TESTOSTERONE TOTAL,FREE,BIO, MALES
Albumin: 4.4 g/dL (ref 3.6–5.1)
Sex Hormone Binding: 56 nmol/L (ref 22–77)
Testosterone, Bioavailable: 84.1 ng/dL (ref 15.0–150.0)
Testosterone, Free: 41.8 pg/mL (ref 6.0–73.0)
Testosterone: 493 ng/dL (ref 250–827)

## 2021-05-06 LAB — VITAMIN B12: Vitamin B-12: 413 pg/mL (ref 200–1100)

## 2021-05-06 LAB — PSA: PSA: 0.39 ng/mL (ref <=4.00)

## 2021-05-06 LAB — TSH: TSH: 1.07 mIU/L (ref 0.40–4.50)

## 2021-05-23 ENCOUNTER — Other Ambulatory Visit: Payer: Self-pay | Admitting: Family Medicine

## 2021-06-03 DIAGNOSIS — L282 Other prurigo: Secondary | ICD-10-CM | POA: Diagnosis not present

## 2021-06-13 ENCOUNTER — Ambulatory Visit (INDEPENDENT_AMBULATORY_CARE_PROVIDER_SITE_OTHER): Payer: Medicare HMO | Admitting: *Deleted

## 2021-06-13 DIAGNOSIS — I2510 Atherosclerotic heart disease of native coronary artery without angina pectoris: Secondary | ICD-10-CM

## 2021-06-13 DIAGNOSIS — E7800 Pure hypercholesterolemia, unspecified: Secondary | ICD-10-CM

## 2021-06-13 NOTE — Chronic Care Management (AMB) (Signed)
?Chronic Care Management  ? ?CCM RN Visit Note ? ?06/13/2021 ?Name: Richard Arnold. MRN: [id number] DOB: February 22, 1950 ? ?Subjective: ?Richard Nesmith. is a 71 y.o. year old male who is a primary care patient of Pickard, Cammie Mcgee, MD. The care management team was consulted for assistance with disease management and care coordination needs.   ? ?Engaged with patient by telephone for follow up visit in response to provider referral for case management and/or care coordination services.  ? ?Consent to Services:  ?The patient was given information about Chronic Care Management services, agreed to services, and gave verbal consent prior to initiation of services.  Please see initial visit note for detailed documentation.  ? ?Patient agreed to services and verbal consent obtained.  ? ?Assessment: Review of patient past medical history, allergies, medications, health status, including review of consultants reports, laboratory and other test data, was performed as part of comprehensive evaluation and provision of chronic care management services.  ? ?SDOH (Social Determinants of Health) assessments and interventions performed:   ? ?CCM Care Plan ? ?Allergies  ?Allergen Reactions  ? Monosodium Glutamate Swelling  ?  Rash, throat swells  ? Latex Rash  ? ? ?Outpatient Encounter Medications as of 06/13/2021  ?Medication Sig  ? aspirin EC 81 MG tablet Take 81 mg by mouth daily. Swallow whole.  ? atorvastatin (LIPITOR) 20 MG tablet Take 1 tablet (20 mg total) by mouth daily.  ? diphenhydrAMINE (BENADRYL) 25 MG tablet Take 25 mg by mouth 2 (two) times daily.  ? ezetimibe (ZETIA) 10 MG tablet Take 1 tablet (10 mg total) by mouth daily.  ? fish oil-omega-3 fatty acids 1000 MG capsule Take 2 g by mouth daily.  ? furosemide (LASIX) 20 MG tablet Take 1 tablet (20 mg total) by mouth daily.  ? metoprolol tartrate (LOPRESSOR) 25 MG tablet Take 0.5 tablets (12.5 mg total) by mouth 2 (two) times daily.  ? Multiple Vitamin (MULTIVITAMIN WITH  MINERALS) TABS tablet Take 1 tablet by mouth daily.  ? nitroGLYCERIN (NITROSTAT) 0.4 MG SL tablet Place 1 tablet (0.4 mg total) under the tongue every 5 (five) minutes as needed for chest pain. You can take up to 3 tablets  ? pantoprazole (PROTONIX) 40 MG tablet TAKE 1 TABLET BY MOUTH TWICE A DAY BEFORE A MEAL  ? primidone (MYSOLINE) 50 MG tablet TAKE 2 TABLETS BY MOUTH AT BEDTIME  ? tamsulosin (FLOMAX) 0.4 MG CAPS capsule TAKE 1 CAPSULE BY MOUTH EVERY DAY  ? TURMERIC PO Take 1 capsule by mouth daily.  ? vitamin C (ASCORBIC ACID) 500 MG tablet Take 500 mg by mouth daily.  ? Zinc 50 MG TABS Take 50 mg by mouth daily.  ? acetaminophen (TYLENOL) 650 MG CR tablet Take 650 mg by mouth every 8 (eight) hours as needed for pain.  ? ?No facility-administered encounter medications on file as of 06/13/2021.  ? ? ?Patient Active Problem List  ? Diagnosis Date Noted  ? Coronary artery disease of native artery of native heart with stable angina pectoris ([place]) 09/03/2020  ? Myalgia due to statin 09/03/2020  ? Aortic atherosclerosis ([place]) 08/14/2020  ? Essential tremor 08/14/2020  ? Hypomagnesemia 05/20/2018  ? Gastrointestinal hemorrhage with melena 05/19/2018  ? Hypercholesteremia 05/19/2018  ? Insomnia 05/19/2018  ? S/P right inguinal hernia repair July 2015 08/30/2013  ? 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 Osteoarthrosis, unspecified whether generalized or localized, shoulder region 01/30/2011  ? ? ?Conditions to be addressed/monitored:CAD and HLD ? ?Care  Plan : RN Care Manager plan of care  ?Updates made by Kassie Mends, RN since 06/13/2021 12:00 AM  ?  ? ?Problem: No plan of care established for management of chronic disease states (HLD, aortic atherosclerosis)   ?Priority: High  ?  ? ?Long-Range Goal: Development of plan of care for chronic disease management (HLD, aortic atherosclerosis)   ?Start Date: 12/23/2020  ?Expected End Date: 12/10/2021  ?Priority: High  ?Note:   ?Current Barriers:  ?Knowledge  Deficits related to plan of care for management of CAD and HLD  ?   ?Component Value Date/Time  ? CHOL 149 05/03/2020 1012  ? TRIG 92 05/03/2020 1012  ? HDL 47 05/03/2020 1012  ? CHOLHDL 3.2 05/03/2020 1012  ? [place] 83 05/03/2020 1012  ?Hyperlipidemia, complicated by not always following heart healthy diet ?Current antihyperlipidemic regimen: atorvastatin and ezetimibe ?Patient reports he lives with spouse, is independent with all aspects of his care, continues to drive, does not have or use DME, continues to be active and rides bicycle, works in his yard, does not have advanced directives and declines. ?ASCVD risk enhancing conditions: age >59 ?Does not adhere to provider recommendations re: following heart healthy diet ?Patient reports he is getting started back trying to ride bicycle outdoors now with warmer days, has been planting his garden, tries to eat healthy but it is difficult ?Patient reports he continues to have bil shoulder pain ? ?RNCM Clinical Goal(s):  ?Patient will verbalize understanding of plan for management of CAD and HLD as evidenced by patient report, review of EHR ?attend all scheduled medical appointments: no scheduled doctor's appointments at present  as evidenced by pt report, review of EHR and        through collaboration with RN Care manager, provider, and care team.  ? ?Interventions: ?1:1 collaboration with primary care provider regarding development and update of comprehensive plan of care as evidenced by provider attestation and co-signature ?Inter-disciplinary care team collaboration (see longitudinal plan of care) ?Evaluation of current treatment plan related to  self management and patient's adherence to plan as established by provider ? ?Hyperlipidemia Interventions: ?Medication review performed; medication list updated in electronic medical record.  ?Counseled on importance of regular laboratory monitoring as prescribed ?Reviewed importance of limiting foods high in  cholesterol ?Reviewed exercise goals and target of 150 minutes per week ?Reinforced importance of continuing to exercise/ ride bicycle ?Pain assessment completed ?Reviewed upcoming scheduled appointments ? ?CAD Interventions:  ?Assessed understanding of CAD diagnosis ?Medications reviewed including medications utilized in CAD treatment plan ?Provided education on Importance of limiting foods high in cholesterol ?Counseled on the importance of exercise goals with target of 150 minutes per week ?Reviewed Importance of taking all medications as prescribed ?Reviewed Importance of attending all scheduled provider appointments ?Reviewed heart healthy diet   ? ?Patient Goals/Self-Care Activities: ?Take medications as prescribed   ?Attend all scheduled provider appointments ?Perform all self care activities independently  ?Perform IADL's (shopping, preparing meals, housekeeping, managing finances) independently ?Call provider office for new concerns or questions  ?Continue to get outside and ride your bicycle, yard work ?Bake or broil foods instead of frying ?Follow low sodium diet and heart healthy diet ?Limit fast food ?Practice relaxation and keep stress to a minimum ? ? ?  ? ? ?Plan:Telephone follow up appointment with care management team member scheduled for:  09/05/21 ? ?Jacqlyn Larsen RNC, BSN ?RN Case Manager ?[place] ?[phone] ? ? ? ? ? ? ? ? ? ?

## 2021-06-13 NOTE — Patient Instructions (Signed)
Visit Information ? ?Thank you for taking time to visit with me today. Please don't hesitate to contact me if I can be of assistance to you before our next scheduled telephone appointment. ? ?Following are the goals we discussed today:  ?Take medications as prescribed   ?Attend all scheduled provider appointments ?Perform all self care activities independently  ?Perform IADL's (shopping, preparing meals, housekeeping, managing finances) independently ?Call provider office for new concerns or questions  ?Continue to get outside and ride your bicycle, yard work ?Bake or broil foods instead of frying ?Follow low sodium diet and heart healthy diet ?Limit fast food ?Practice relaxation and keep stress to a minimum ? ?Our next appointment is by telephone on 09/05/21 at 9 am ? ?Please call the care guide team at [phone] if you need to cancel or reschedule your appointment.  ? ?If you are experiencing a Mental Health or [place] or need someone to talk to, please call the Suicide and Crisis Lifeline: 988 ?call the [place] National Suicide Prevention Lifeline: 5714604902 or TTY: 9790405985 TTY 718-342-3769) to talk to a trained counselor ?call 1-800-273-TALK (toll free, 24 hour hotline) ?go to [facility] [address], [place] [phone]) ?call 911  ? ?Patient verbalizes understanding of instructions and care plan provided today and agrees to view in [place]. Active MyChart status confirmed with patient.   ? ?Jacqlyn Larsen RNC, BSN ?RN Case Manager ?[place] ?[phone] ? ?

## 2021-06-18 ENCOUNTER — Telehealth: Payer: Self-pay | Admitting: Internal Medicine

## 2021-06-18 NOTE — Telephone Encounter (Signed)
Called pt in regards to SOB.  Pt reports started 2-3 weeks ago.  SOB occurs with activity.  Pt has to stop and catch breath while performing yard work.  BP today 129/78-80.  Denies palpitations and CP.  Does not take furosemide 20 mg PO QD as scheduled.  Expresses may take furosemide 4 times per week.  Reports can't stay out of the bathroom.  Explained to pt that is the intention of medication.  Advised pt to take medication as ordered for the rest of this week and to call back or send a my chart message if SOB does not improve.  Advised pt if has activities planned to take furosemide at a time when can get to bathroom easily.  Overdue MD f/u scheduled for 07/14/21 at 11: 20 am.  Pt verbalizes understanding no questions or concerns.  ?

## 2021-06-18 NOTE — Telephone Encounter (Signed)
Pt c/o Shortness Of Breath: STAT if SOB developed within the last 24 hours or pt is noticeably SOB on the phone ? ?1. Are you currently SOB (can you hear that pt is SOB on the phone)? no ? ?2. How long have you been experiencing SOB? 2/3 week ? ?3. Are you SOB when sitting or when up moving around? Moving ? ?4. Are you currently experiencing any other symptoms? Feeling extra tired ? ?

## 2021-07-09 DIAGNOSIS — I2510 Atherosclerotic heart disease of native coronary artery without angina pectoris: Secondary | ICD-10-CM | POA: Diagnosis not present

## 2021-07-09 DIAGNOSIS — E785 Hyperlipidemia, unspecified: Secondary | ICD-10-CM | POA: Diagnosis not present

## 2021-07-11 NOTE — Progress Notes (Signed)
Cardiology Office Note:    Date:  07/14/2021   ID:  Richard Spark., DOB 07-Aug-1950, MRN [id number]  PCP:  Donita Brooks, MD   [facility] Providers Cardiologist:  Christell Constant, MD     Referring MD: Donita Brooks, MD   CC: CAD f/u  History of Present Illness:    Richard Lafevers. is a 71 y.o. male with a hx of HLD who presents for evaluation 08/14/20.  In 2022 had cath with OM2 disease that was two small for intervention. Statin myalgias.  [place]aled 06/18/21 with SOB with lasix non adherence.  Patient notes that he is doing fine.   Since last visit notes that he is working in the yard and the garden; going on the E bike and is doing well ( he is peddling the hold time) There are no interval hospital/ED visit.    No chest pain , rare pressure; no nitroglycerin use.  No SOB/DOE and no PND/Orthopnea.  No weight gain or leg swelling.  No palpitations or syncope.  Notes a significant response when he takes lasix.   Past Medical History:  Diagnosis Date   Arthritis    Brachial neuritis    Gastric ulcer    GI bleed    H pylori ulcer    Hypercholesteremia    Turner syndrome    left arm, some numbness in fingers    Past Surgical History:  Procedure Laterality Date   COLONOSCOPY WITH PROPOFOL N/A 03/05/2015   Procedure: COLONOSCOPY WITH PROPOFOL;  Surgeon: Charolett Bumpers, MD;  Location: [facility];  Service: Endoscopy;  Laterality: N/A;   ESOPHAGOGASTRODUODENOSCOPY  09/08/2011   Procedure: ESOPHAGOGASTRODUODENOSCOPY (EGD);  Surgeon: Vertell Novak., MD;  Location: [facility];  Service: Endoscopy;  Laterality: N/A;   HERNIA REPAIR     INGUINAL HERNIA REPAIR Right 08/30/2013   Procedure: HERNIA REPAIR INGUINAL WITH MESH  ADULT;  Surgeon: Valarie Merino, MD;  Location: [facility] ORS;  Service: General;  Laterality: Right;   LEFT HEART CATH AND CORONARY ANGIOGRAPHY N/A 08/19/2020   Procedure: LEFT HEART CATH AND CORONARY ANGIOGRAPHY;  Surgeon: [place], Peter M, MD;   Location: [facility];  Service: Cardiovascular;  Laterality: N/A;   SHOULDER ARTHROSCOPY  2011   x4  one open rt, rotator cuff tear, Dr. Amanda Pea   TOTAL SHOULDER ARTHROPLASTY  01/30/2011   Procedure: TOTAL SHOULDER ARTHROPLASTY;  Surgeon: Verlee Rossetti;  Location: [facility] OR;  Service: Orthopedics;  Laterality: Right;  right total shoulder atrhoplasty    Current Medications: Current Meds  Medication Sig   acetaminophen (TYLENOL) 650 MG CR tablet Take 650 mg by mouth every 8 (eight) hours as needed for pain.   aspirin EC 81 MG tablet Take 81 mg by mouth daily. Swallow whole.   atorvastatin (LIPITOR) 40 MG tablet 20 mg daily at 6 (six) AM.   diphenhydrAMINE (BENADRYL) 25 MG tablet Take 25 mg by mouth 2 (two) times daily.   ezetimibe (ZETIA) 10 MG tablet Take 1 tablet (10 mg total) by mouth daily.   fish oil-omega-3 fatty acids 1000 MG capsule Take 2 g by mouth daily.   furosemide (LASIX) 20 MG tablet Take 1 tablet (20 mg total) by mouth daily.   metoprolol tartrate (LOPRESSOR) 25 MG tablet Take 0.5 tablets (12.5 mg total) by mouth 2 (two) times daily.   Multiple Vitamin (MULTIVITAMIN WITH MINERALS) TABS tablet Take 1 tablet by mouth daily.   pantoprazole (PROTONIX) 40 MG tablet  TAKE 1 TABLET BY MOUTH TWICE A DAY BEFORE A MEAL   primidone (MYSOLINE) 50 MG tablet TAKE 2 TABLETS BY MOUTH AT BEDTIME   tamsulosin (FLOMAX) 0.4 MG CAPS capsule TAKE 1 CAPSULE BY MOUTH EVERY DAY   TURMERIC PO Take 1 capsule by mouth daily.   vitamin C (ASCORBIC ACID) 500 MG tablet Take 500 mg by mouth daily.   Zinc 50 MG TABS Take 50 mg by mouth daily.   [DISCONTINUED] atorvastatin (LIPITOR) 20 MG tablet Take 1 tablet (20 mg total) by mouth daily.   [DISCONTINUED] nitroGLYCERIN (NITROSTAT) 0.4 MG SL tablet Place 1 tablet (0.4 mg total) under the tongue every 5 (five) minutes as needed for chest pain. You can take up to 3 tablets     Allergies:   Bismuth subsalicylate, Monosodium glutamate, and Latex   Social  History   Socioeconomic History   Marital status: Married    Spouse name: Not on file   Number of children: Not on file   Years of education: Not on file   Highest education level: Not on file  Occupational History   Not on file  Tobacco Use   Smoking status: Never   Smokeless tobacco: Never  Vaping Use   Vaping Use: Never used  Substance and Sexual Activity   Alcohol use: No   Drug use: No   Sexual activity: Not on file  Other Topics Concern   Not on file  Social History Narrative   Not on file   Social Determinants of Health   Financial Resource Strain: Not on file  Food Insecurity: No Food Insecurity   Worried About Running Out of Food in the Last Year: Never true   Ran Out of Food in the Last Year: Never true  Transportation Needs: No Transportation Needs   Lack of Transportation (Medical): No   Lack of Transportation (Non-Medical): No  Physical Activity: Not on file  Stress: Not on file  Social Connections: Not on file    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 History of coronary artery disease notable for father with multiple bypass surgeries, mother, uncle, and grandfather.  ROS:   Please see the history of present illness.    All other systems reviewed and are negative.  EKGs/Labs/Other Studies Reviewed:    The following studies were reviewed today:  EKG:   08/14/20: SR rate 76 WNL  Transthoracic Echocardiogram Date:06/12/20 Results: 1. Left ventricular ejection fraction, by estimation, is 50 to 55%. The  left ventricle has low normal function. The left ventricle has no regional  wall motion abnormalities. Definity contrast agent was given IV to  delineate the left ventricular  endocardial borders. The left ventricular internal cavity size was normal  in size. There is no left ventricular hypertrophy. Left ventricular  diastolic  parameters are consistent with Grade I diastolic dysfunction  (impaired relaxation).  2. The right ventricular size is normal. No increase in right ventricular  wall thickness. Right ventricular systolic function is normal. There is  normal pulmonary artery systolic pressure. The tricuspid regurgitant  velocity is 2.29 m/s, and with an  assumed right atrial pressure of 8 mmHg, the estimated right ventricular  systolic pressure is 29.0 mmHg.  3. The mitral valve is abnormal. Mild to moderate mitral annular  calcification. Trivial mitral valve regurgitation. No evidence of mitral  valve stenosis.  4. The aortic valve was not well  visualized. Aortic valve regurgitation is  not visualized. No aortic stenosis is present.  5. Prominent Chiari network seen in the subcostal.   NonCardiac CT: Date:12/16/2006 Results: LM, LAD CAC and Aortic Atherosclerosis  Left/Right Heart Catheterizations: Date: 08/09/20 Results: Prox RCA to Mid RCA lesion is 60% stenosed. Ost RCA lesion is 50% stenosed. Dist LM to Ost LAD lesion is 25% stenosed. 2nd Mrg lesion is 80% stenosed. LPAV lesion is 40% stenosed with 40% stenosed side branch in LPDA. LV end diastolic pressure is mildly elevated.   1. Nonobstructive CAD except for a tiny second OM 2. Mildly elevated LVEDP   Recent Labs: 05/05/2021: ALT 18; BUN 18; Creat 1.07; Hemoglobin 14.2; Platelets 235; Potassium 4.7; Sodium 138; TSH 1.07  Recent Lipid Panel    Component Value Date/Time   CHOL 132 04/11/2021 1001   TRIG 171 (H) 04/11/2021 1001   HDL 33 (L) 04/11/2021 1001   CHOLHDL 4.0 04/11/2021 1001   CHOLHDL 3.2 05/03/2020 1012   LDLCALC 70 04/11/2021 1001   LDLCALC 83 05/03/2020 1012        Physical Exam:    VS:  BP 110/69   Pulse 66   Ht 5\' 8"  (1.727 m)   Wt 206 lb (93.4 kg)   SpO2 96%   BMI 31.32 kg/m     Wt Readings from Last 3 Encounters:  07/14/21 206 lb (93.4 kg)  05/05/21 206 lb 9.6 oz (93.7 kg)  12/09/20 200 lb 6.4 oz (90.9  kg)    Gen: No distress   Neck: No JVD Ears: R sided Frank's Sign Cardiac: No Rubs or Gallops, soft systolic murmur, RRR +2 radial pulses Respiratory: Clear to auscultation bilaterally, normal effort, normal  respiratory rate GI: Soft, nontender, non-distended  MS: trace bilateral edema;  moves all extremities Integument: Skin feels warm Neuro:  At time of evaluation, alert and oriented to person/place/time/situation  Psych: Normal affect, patient feels well   ASSESSMENT:    1. Coronary artery disease of native artery of native heart with stable angina pectoris (HCC)   2. Aortic atherosclerosis (HCC)   3. Hypercholesteremia   4. Myalgia due to statin   5. Essential tremor     PLAN:    CAD (OM2- small vessel) Statin myalgias unable to tolerate statins Aortic atherosclerosis  Essential Tremor (mild left side only) -continue ASA 81 mg PO Daily - zetia 10 mg and atorvastatin 20 mg; cannot tolerate higher dose, LDL 70 -Continue metoprolol 12.5 mg PO BID - No PRN nitro needed - continue lasix 20 mg PO daily  - his anginal equivalent is chest pressure; if he has will have him take nitro; potentially to add IMDUR and PET for now LCX lesion  MAC soft systolic murmur no MR on echo - potential repeat echo 2024-2025  One year me or APP    Medication Adjustments/Labs and Tests Ordered: Current medicines are reviewed at length with the patient today.  Concerns regarding medicines are outlined above.  No orders of the defined types were placed in this encounter.     Meds ordered this encounter  Medications   nitroGLYCERIN (NITROSTAT) 0.4 MG SL tablet    Sig: Place 1 tablet (0.4 mg total) under the tongue every 5 (five) minutes as needed for chest pain. You can take up to 3 tablets    Dispense:  25 tablet    Refill:  3       Patient Instructions  Medication Instructions:  Your physician recommends that you continue on  your current medications as directed. Please refer  to the Current Medication list given to you today. REFILL: Nitroglycerin *If you need a refill on your cardiac medications before your next appointment, please call your pharmacy*   Lab Work: NONE If you have labs (blood work) drawn today and your tests are completely normal, you will receive your results only by: MyChart Message (if you have MyChart) OR A paper copy in the mail If you have any lab test that is abnormal or we need to change your treatment, we will call you to review the results.   Testing/Procedures: NONE   Follow-Up: At [facility], you and your health needs are our priority.  As part of our continuing mission to provide you with exceptional heart care, we have created designated Provider Care Teams.  These Care Teams include your primary Cardiologist (physician) and Advanced Practice Providers (APPs -  Physician Assistants and Nurse Practitioners) who all work together to provide you with the care you need, when you need it.  We recommend signing up for the patient portal called "MyChart".  Sign up information is provided on this After Visit Summary.  MyChart is used to connect with patients for Virtual Visits (Telemedicine).  Patients are able to view lab/test results, encounter notes, upcoming appointments, etc.  Non-urgent messages can be sent to your provider as well.   To learn more about what you can do with MyChart, go to [email or web address].    Your next appointment:   1 year(s)  The format for your next appointment:   In Person  Provider:   Christell Constant, MD     Important Information About Sugar         Signed, Christell Constant, MD  07/14/2021 12:06 PM    [facility]

## 2021-07-14 ENCOUNTER — Encounter: Payer: Self-pay | Admitting: Internal Medicine

## 2021-07-14 ENCOUNTER — Ambulatory Visit: Payer: Medicare HMO | Admitting: Internal Medicine

## 2021-07-14 VITALS — BP 110/69 | HR 66 | Ht 68.0 in | Wt 206.0 lb

## 2021-07-14 DIAGNOSIS — I25118 Atherosclerotic heart disease of native coronary artery with other forms of angina pectoris: Secondary | ICD-10-CM | POA: Diagnosis not present

## 2021-07-14 DIAGNOSIS — T466X5A Adverse effect of antihyperlipidemic and antiarteriosclerotic drugs, initial encounter: Secondary | ICD-10-CM

## 2021-07-14 DIAGNOSIS — T466X5D Adverse effect of antihyperlipidemic and antiarteriosclerotic drugs, subsequent encounter: Secondary | ICD-10-CM | POA: Diagnosis not present

## 2021-07-14 DIAGNOSIS — I700 Atherosclerosis of aorta: Secondary | ICD-10-CM

## 2021-07-14 DIAGNOSIS — M791 Myalgia, unspecified site: Secondary | ICD-10-CM | POA: Diagnosis not present

## 2021-07-14 DIAGNOSIS — G250 Essential tremor: Secondary | ICD-10-CM | POA: Diagnosis not present

## 2021-07-14 DIAGNOSIS — E7800 Pure hypercholesterolemia, unspecified: Secondary | ICD-10-CM

## 2021-07-14 MED ORDER — NITROGLYCERIN 0.4 MG SL SUBL: 0.4000 mg | SUBLINGUAL_TABLET | SUBLINGUAL | 3 refills | Status: DC | PRN

## 2021-07-14 NOTE — Patient Instructions (Signed)
Medication Instructions:  Your physician recommends that you continue on your current medications as directed. Please refer to the Current Medication list given to you today. REFILL: Nitroglycerin *If you need a refill on your cardiac medications before your next appointment, please call your pharmacy*   Lab Work: NONE If you have labs (blood work) drawn today and your tests are completely normal, you will receive your results only by: [place] (if you have MyChart) OR A paper copy in the mail If you have any lab test that is abnormal or we need to change your treatment, we will call you to review the results.   Testing/Procedures: NONE   Follow-Up: At [facility], you and your health needs are our priority.  As part of our continuing mission to provide you with exceptional heart care, we have created designated Provider Care Teams.  These Care Teams include your primary Cardiologist (physician) and Advanced Practice Providers (APPs -  Physician Assistants and Nurse Practitioners) who all work together to provide you with the care you need, when you need it.  We recommend signing up for the patient portal called "MyChart".  Sign up information is provided on this After Visit Summary.  MyChart is used to connect with patients for Virtual Visits (Telemedicine).  Patients are able to view lab/test results, encounter notes, upcoming appointments, etc.  Non-urgent messages can be sent to your provider as well.   To learn more about what you can do with MyChart, go to [email or web address].    Your next appointment:   1 year(s)  The format for your next appointment:   In Person  Provider:   Werner Lean, MD     Important Information About Sugar

## 2021-08-07 ENCOUNTER — Other Ambulatory Visit: Payer: Self-pay | Admitting: Family Medicine

## 2021-08-07 ENCOUNTER — Other Ambulatory Visit: Payer: Self-pay | Admitting: Internal Medicine

## 2021-08-08 NOTE — Telephone Encounter (Signed)
Requested Prescriptions  Pending Prescriptions Disp Refills  . pantoprazole (PROTONIX) 40 MG tablet [Pharmacy Med Name: PANTOPRAZOLE SOD DR 40 MG TAB] 180 tablet 2    Sig: TAKE 1 TABLET BY MOUTH TWICE A DAY BEFORE A MEAL     Gastroenterology: Proton Pump Inhibitors Failed - 08/07/2021 11:06 PM      Failed - Valid encounter within last 12 months    Recent Outpatient Visits          3 months ago Prostate cancer screening   [place] Susy Frizzle, MD   1 year ago DOE (dyspnea on exertion)   [place] Pickard, Cammie Mcgee, MD   1 year ago LLQ abdominal pain   [place] Denn[place] Schaumann, Cammie Mcgee, MD   2 years ago Pure hypercholesterolemia   [place], Cammie Mcgee, MD   2 years ago Low back strain, initial encounter   [place] Pickard, Cammie Mcgee, MD

## 2021-09-01 ENCOUNTER — Ambulatory Visit (INDEPENDENT_AMBULATORY_CARE_PROVIDER_SITE_OTHER): Payer: Medicare HMO | Admitting: *Deleted

## 2021-09-01 DIAGNOSIS — I2510 Atherosclerotic heart disease of native coronary artery without angina pectoris: Secondary | ICD-10-CM

## 2021-09-01 DIAGNOSIS — E7800 Pure hypercholesterolemia, unspecified: Secondary | ICD-10-CM

## 2021-09-01 NOTE — Chronic Care Management (AMB) (Signed)
Chronic Care Management   CCM RN Visit Note  09/01/2021 Name: Richard Arnold. MRN: [id number] DOB: August 25, 1950  Subjective: Richard Arnold. is a 71 y.o. year old male who is a primary care patient of Pickard, Cammie Mcgee, MD. The care management team was consulted for assistance with disease management and care coordination needs.    Engaged with patient by telephone for follow up visit in response to provider referral for case management and/or care coordination services.   Consent to Services:  The patient was given information about Chronic Care Management services, agreed to services, and gave verbal consent prior to initiation of services.  Please see initial visit note for detailed documentation.   Patient agreed to services and verbal consent obtained.   Assessment: Review of patient past medical history, allergies, medications, health status, including review of consultants reports, laboratory and other test data, was performed as part of comprehensive evaluation and provision of chronic care management services.   SDOH (Social Determinants of Health) assessments and interventions performed:    CCM Care Plan  Allergies  Allergen Reactions   Bismuth Subsalicylate     Other reaction(s): hives   Monosodium Glutamate Swelling    Rash, throat swells   Latex Rash    Outpatient Encounter Medications as of 09/01/2021  Medication Sig   acetaminophen (TYLENOL) 650 MG CR tablet Take 650 mg by mouth every 8 (eight) hours as needed for pain.   aspirin EC 81 MG tablet Take 81 mg by mouth daily. Swallow whole.   atorvastatin (LIPITOR) 40 MG tablet 20 mg daily at 6 (six) AM.   diphenhydrAMINE (BENADRYL) 25 MG tablet Take 25 mg by mouth 2 (two) times daily.   ezetimibe (ZETIA) 10 MG tablet TAKE 1 TABLET BY MOUTH EVERY DAY   fish oil-omega-3 fatty acids 1000 MG capsule Take 2 g by mouth daily.   furosemide (LASIX) 20 MG tablet TAKE 1 TABLET BY MOUTH EVERY DAY   metoprolol tartrate  (LOPRESSOR) 25 MG tablet TAKE 0.5 TABLETS BY MOUTH 2 TIMES DAILY.   Multiple Vitamin (MULTIVITAMIN WITH MINERALS) TABS tablet Take 1 tablet by mouth daily.   nitroGLYCERIN (NITROSTAT) 0.4 MG SL tablet Place 1 tablet (0.4 mg total) under the tongue every 5 (five) minutes as needed for chest pain. You can take up to 3 tablets   pantoprazole (PROTONIX) 40 MG tablet TAKE 1 TABLET BY MOUTH TWICE A DAY BEFORE A MEAL   primidone (MYSOLINE) 50 MG tablet TAKE 2 TABLETS BY MOUTH AT BEDTIME   tamsulosin (FLOMAX) 0.4 MG CAPS capsule TAKE 1 CAPSULE BY MOUTH EVERY DAY   TURMERIC PO Take 1 capsule by mouth daily.   vitamin C (ASCORBIC ACID) 500 MG tablet Take 500 mg by mouth daily.   Zinc 50 MG TABS Take 50 mg by mouth daily.   No facility-administered encounter medications on file as of 09/01/2021.    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 08/22/2013   Osteoarthrosis, unspecified whether generalized or localized, shoulder region 01/30/2011    Conditions to be addressed/monitored:CAD and HLD  Care Plan : RN Care Manager plan of care  Updates made by Kassie Mends, RN since 09/01/2021 12:00 AM  Completed 09/01/2021   Problem: No plan of care established for management of chronic disease states (HLD, aortic atherosclerosis) Resolved 09/01/2021  Priority: High     Long-Range Goal: Development of plan of care for chronic disease management (HLD, aortic atherosclerosis) Completed 09/01/2021  Start Date: 12/23/2020  Expected End Date: 12/10/2021   Priority: High  Note:   Current Barriers:  Knowledge Deficits related to plan of care for management of CAD and HLD     Component Value Date/Time   CHOL 149 05/03/2020 1012   TRIG 92 05/03/2020 1012   HDL 47 05/03/2020 1012   CHOLHDL 3.2 05/03/2020 1012   LDLCALC 83 05/03/2020 1012  Hyperlipidemia, complicated by not always following heart healthy diet Current antihyperlipidemic regimen: atorvastatin and ezetimibe Patient reports he lives with spouse, is independent with all aspects of his care, continues to drive, does not have or use DME, continues to be active and rides bicycle, works in his yard, does not have advanced directives and declines. ASCVD risk enhancing conditions: age >27 Does not adhere to provider recommendations re: following heart healthy diet Patient reports he is getting started back trying to ride bicycle outdoors now with warmer days, has been planting his garden, tries to eat healthy but it is difficult Patient reports he is outdoors working today and doing well, no new concerns reported  RNCM Clinical Goal(s):  Patient will verbalize understanding of plan for management of CAD and HLD as evidenced by patient report, review of EHR attend all scheduled medical appointments: no scheduled doctor's appointments at present  as evidenced by pt report, review of EHR and        through collaboration with Consulting civil engineer, provider, and care team.   Interventions: 1:1 collaboration with primary care provider regarding development and update of comprehensive plan of care as evidenced by provider attestation and co-signature Inter-disciplinary care team collaboration (see longitudinal plan of care) Evaluation of current treatment plan related to  self management and patient's adherence to plan as established by provider  Hyperlipidemia Interventions: Medication review performed; medication list updated in electronic medical record.  Counseled on importance of regular  laboratory monitoring as prescribed Reviewed importance of limiting foods high in cholesterol Reviewed exercise goals and target of 150 minutes per week Reviewed importance of continuing to exercise/ ride bicycle Pain assessment completed Reviewed upcoming scheduled appointments Reviewed plan of care with patient including case closure today  CAD Interventions:  Assessed understanding of CAD diagnosis Medications reviewed including medications utilized in CAD treatment plan Provided education on Importance of limiting foods high in cholesterol Counseled on the importance of exercise goals with target of 150 minutes per week Reviewed Importance of taking all medications as prescribed Reviewed Importance of attending all scheduled provider appointments Advised to report any changes in symptoms or exercise tolerance Reinforced heart healthy diet   Encouraged patient to continue getting outside daily weather permitting  Patient Goals/Self-Care Activities: Take medications as prescribed   Attend all scheduled provider appointments Call pharmacy for medication refills 3-7 days in advance of running out of medications Attend church or other social activities Perform all self care activities independently  Perform IADL's (shopping, preparing meals, housekeeping, managing finances) independently Call provider office for new concerns or questions  Continue to get outside and ride your bicycle, yard work [employer] or broil foods instead of frying Follow low sodium diet  and heart healthy diet Limit fast food Practice relaxation and keep stress to a minimum Case closure today       Plan:No further follow up required: case closure today  Jacqlyn Larsen [facility], BSN RN Case Manager [facility] [phone]

## 2021-09-01 NOTE — Patient Instructions (Signed)
Visit Information  Thank you for taking time to visit with me today. Please don't hesitate to contact me if I can be of assistance to you before our next scheduled telephone appointment.  Following are the goals we discussed today:   Take medications as prescribed   Attend all scheduled provider appointments Call pharmacy for medication refills 3-7 days in advance of running out of medications Attend church or other social activities Perform all self care activities independently  Perform IADL's (shopping, preparing meals, housekeeping, managing finances) independently Call provider office for new concerns or questions  Continue to get outside and ride your bicycle, yard work [employer] or broil foods instead of frying Follow low sodium diet and heart healthy diet Limit fast food Practice relaxation and keep stress to a minimum Case closure today  Please call the care guide team at [phone] if you need to cancel or reschedule your appointment.   If you are experiencing a Mental Health or [place] or need someone to talk to, please call the Suicide and Crisis Lifeline: 988 call the [place] National Suicide Prevention Lifeline: (912)437-0746 or TTY: 917-240-2516 TTY (281)336-5804) to talk to a trained counselor call 1-800-273-TALK (toll free, 24 hour hotline) go to [facility] [address] [place] [phone]) call 911   Patient verbalizes understanding of instructions and care plan provided today and agrees to view in [place]. Active MyChart status and patient understanding of how to access instructions and care plan via MyChart confirmed with patient.     Jacqlyn Larsen RNC, BSN RN Case Manager [facility] [phone]

## 2021-09-02 DIAGNOSIS — H524 Presbyopia: Secondary | ICD-10-CM | POA: Diagnosis not present

## 2021-09-02 DIAGNOSIS — Z0100 Encounter for examination of eyes and vision without abnormal findings: Secondary | ICD-10-CM | POA: Diagnosis not present

## 2021-09-05 ENCOUNTER — Telehealth: Payer: Self-pay

## 2021-09-08 DIAGNOSIS — E7800 Pure hypercholesterolemia, unspecified: Secondary | ICD-10-CM

## 2021-09-08 DIAGNOSIS — I2510 Atherosclerotic heart disease of native coronary artery without angina pectoris: Secondary | ICD-10-CM | POA: Diagnosis not present

## 2021-11-04 ENCOUNTER — Other Ambulatory Visit: Payer: Self-pay | Admitting: Family Medicine

## 2021-11-04 ENCOUNTER — Telehealth: Payer: Self-pay

## 2021-11-04 MED ORDER — ATORVASTATIN CALCIUM 20 MG PO TABS: 20.0000 mg | ORAL_TABLET | Freq: Every day | ORAL | 3 refills | Status: DC

## 2021-11-04 NOTE — Telephone Encounter (Signed)
Pt states his cardiologist decreased his atorvastatin from 40 mg to 20 mg and pt needs a new Rx sent in. Okay to send in? Thank you.

## 2021-11-19 ENCOUNTER — Other Ambulatory Visit: Payer: Self-pay | Admitting: Family Medicine

## 2021-11-19 NOTE — Telephone Encounter (Signed)
Requested Prescriptions  Pending Prescriptions Disp Refills  . primidone (MYSOLINE) 50 MG tablet [Pharmacy Med Name: PRIMIDONE 50 MG TABLET] 180 tablet 0    Sig: TAKE 2 TABLETS BY MOUTH AT BEDTIME     Neurology:  Anticonvulsants - primidone Failed - 11/19/2021 12:13 AM      Failed - Primidone (Serum) in normal range and within 360 days    No results found for: "PRIMIDONE"       Failed - Completed PHQ-2 or PHQ-9 in the last 360 days      Failed - Valid encounter within last 12 months    Recent Outpatient Visits          6 months ago Prostate cancer screening   [place] Susy Frizzle, MD   1 year ago DOE (dyspnea on exertion)   [place] Susy Frizzle, MD   2 years ago LLQ abdominal pain   [place] Susy Frizzle, MD   2 years ago Pure hypercholesterolemia   [place] Dennard Schaumann, Cammie Mcgee, MD   2 years ago Low back strain, initial encounter   [place] Pickard, Cammie Mcgee, MD             Passed - HCT in normal range and within 360 days    HCT  Date Value Ref Range Status  05/05/2021 41.9 38.5 - 50.0 % Final   Hematocrit  Date Value Ref Range Status  08/14/2020 42.4 37.5 - 51.0 % Final         Passed - HGB in normal range and within 360 days    Hemoglobin  Date Value Ref Range Status  05/05/2021 14.2 13.2 - 17.1 g/dL Final  08/14/2020 14.4 13.0 - 17.7 g/dL Final         Passed - PLT in normal range and within 360 days    Platelets  Date Value Ref Range Status  05/05/2021 235 140 - 400 Thousand/uL Final  08/14/2020 235 150 - 450 x10E3/uL Final         Passed - WBC in normal range and within 360 days    WBC  Date Value Ref Range Status  05/05/2021 5.3 3.8 - 10.8 Thousand/uL Final         Passed - AST in normal range and within 360 days    AST  Date Value Ref Range Status  05/05/2021 19 10 - 35 U/L Final         Passed - ALT in normal range and within 360  days    ALT  Date Value Ref Range Status  05/05/2021 18 9 - 46 U/L Final         Passed - Cr in normal range and within 360 days    Creat  Date Value Ref Range Status  05/05/2021 1.07 0.70 - 1.28 mg/dL Final         . tamsulosin (FLOMAX) 0.4 MG CAPS capsule [Pharmacy Med Name: TAMSULOSIN HCL 0.4 MG CAPSULE] 90 capsule 0    Sig: TAKE 1 [place]     Urology: Alpha-Adrenergic Blocker Failed - 11/19/2021 12:13 AM      Failed - Valid encounter within last 12 months    Recent Outpatient Visits          6 months ago Prostate cancer screening   [place], Warren T, MD   1 year ago DOE (dyspnea on exertion)  [place] Pickard, Cammie Mcgee, MD   2 years ago LLQ abdominal pain   [place] Dennard Schaumann, Cammie Mcgee, MD   2 years ago Pure hypercholesterolemia   [place] Dennard Schaumann, Cammie Mcgee, MD   2 years ago Low back strain, initial encounter   [place] Pickard, Cammie Mcgee, MD             Passed - PSA in normal range and within 360 days    PSA  Date Value Ref Range Status  05/05/2021 0.39 < OR = 4.00 ng/mL Final    Comment:    The total PSA value from this assay system is  standardized against the WHO standard. The test  result will be approximately 20% lower when compared  to the equimolar-standardized total PSA (Beckman  Coulter). Comparison of serial PSA results should be  interpreted with this fact in mind. . This test was performed using the Siemens  chemiluminescent method. Values obtained from  different assay methods cannot be used interchangeably. PSA levels, regardless of value, should not be interpreted as absolute evidence of the presence or absence of disease.          Passed - Last BP in normal range    BP Readings from Last 1 Encounters:  07/14/21 110/69

## 2022-01-12 ENCOUNTER — Other Ambulatory Visit: Payer: Self-pay

## 2022-01-12 MED ORDER — ATORVASTATIN CALCIUM 20 MG PO TABS: 20.0000 mg | ORAL_TABLET | Freq: Every day | ORAL | 3 refills | Status: DC

## 2022-01-24 ENCOUNTER — Other Ambulatory Visit: Payer: Self-pay | Admitting: Family Medicine

## 2022-02-19 DIAGNOSIS — D485 Neoplasm of uncertain behavior of skin: Secondary | ICD-10-CM | POA: Diagnosis not present

## 2022-02-19 DIAGNOSIS — L570 Actinic keratosis: Secondary | ICD-10-CM | POA: Diagnosis not present

## 2022-02-19 DIAGNOSIS — D225 Melanocytic nevi of trunk: Secondary | ICD-10-CM | POA: Diagnosis not present

## 2022-02-19 DIAGNOSIS — Z85828 Personal history of other malignant neoplasm of skin: Secondary | ICD-10-CM | POA: Diagnosis not present

## 2022-02-19 DIAGNOSIS — L821 Other seborrheic keratosis: Secondary | ICD-10-CM | POA: Diagnosis not present

## 2022-02-19 DIAGNOSIS — L814 Other melanin hyperpigmentation: Secondary | ICD-10-CM | POA: Diagnosis not present

## 2022-03-06 ENCOUNTER — Other Ambulatory Visit: Payer: Self-pay | Admitting: Family Medicine

## 2022-03-06 ENCOUNTER — Telehealth: Payer: Self-pay

## 2022-03-06 MED ORDER — VALACYCLOVIR HCL 1 G PO TABS: 2000.0000 mg | ORAL_TABLET | Freq: Two times a day (BID) | ORAL | 0 refills | Status: DC

## 2022-03-06 NOTE — Telephone Encounter (Signed)
Spoke w/pt and aware Rx sent to pharmacy per pcp  Nothing further.

## 2022-03-06 NOTE — Telephone Encounter (Signed)
Pt called to ask for a Rx for a cold sore.   Pls advice

## 2022-03-14 ENCOUNTER — Other Ambulatory Visit: Payer: Self-pay | Admitting: Family Medicine

## 2022-04-21 ENCOUNTER — Other Ambulatory Visit: Payer: Self-pay

## 2022-04-21 DIAGNOSIS — E7800 Pure hypercholesterolemia, unspecified: Secondary | ICD-10-CM

## 2022-04-21 MED ORDER — ATORVASTATIN CALCIUM 20 MG PO TABS: 20.0000 mg | ORAL_TABLET | Freq: Every day | ORAL | 3 refills | Status: DC

## 2022-04-22 ENCOUNTER — Other Ambulatory Visit: Payer: Self-pay | Admitting: Family Medicine

## 2022-04-29 ENCOUNTER — Telehealth: Payer: Self-pay | Admitting: Family Medicine

## 2022-04-29 NOTE — Telephone Encounter (Signed)
[place]. to schedule their annual wellness visit. Appointment made for 04/30/2022.  Thank you,  Colletta [place],  [place] [facility] Direct Dial ??[phone]

## 2022-04-30 ENCOUNTER — Ambulatory Visit (INDEPENDENT_AMBULATORY_CARE_PROVIDER_SITE_OTHER): Payer: Medicare HMO

## 2022-04-30 VITALS — Ht 68.0 in | Wt 206.0 lb

## 2022-04-30 DIAGNOSIS — Z0000 Encounter for general adult medical examination without abnormal findings: Secondary | ICD-10-CM

## 2022-04-30 NOTE — Patient Instructions (Signed)
Richard Arnold , Thank you for taking time to come for your Medicare Wellness Visit. I appreciate your ongoing commitment to your health goals. Please review the following plan we discussed and let me know if I can assist you in the future.   These are the goals we discussed:  Goals      Increase physical activity        This is a list of the screening recommended for you and due dates:  Health Maintenance  Topic Date Due   DTaP/Tdap/Td vaccine (1 - Tdap) Never done   Zoster (Shingles) Vaccine (1 of 2) Never done   COVID-19 Vaccine (3 - 2023-24 season) 10/10/2021   Medicare Annual Wellness Visit  04/30/2023   Colon Cancer Screening  03/04/2025   Pneumonia Vaccine  Completed   Flu Shot  Completed   Hepatitis C Screening: USPSTF Recommendation to screen - Ages 18-79 yo.  Completed   HPV Vaccine  Aged Out    Advanced directives: Forms are available if you choose in the future to pursue completion.  This is recommended in order to make sure that your health wishes are honored in the event that you are unable to verbalize them to the provider.   Conditions/risks identified: Aim for 30 minutes of exercise or brisk walking, 6-8 glasses of water, and 5 servings of fruits and vegetables each day.   Next appointment: Follow up in one year for your annual wellness visit.   Preventive Care 74 Years and Older, Male  Preventive care refers to lifestyle choices and visits with your health care provider that can promote health and wellness. What does preventive care include? A yearly physical exam. This is also called an annual well check. Dental exams once or twice a year. Routine eye exams. Ask your health care provider how often you should have your eyes checked. Personal lifestyle choices, including: Daily care of your teeth and gums. Regular physical activity. Eating a healthy diet. Avoiding tobacco and drug use. Limiting alcohol use. Practicing safe sex. Taking low doses of aspirin every  day. Taking vitamin and mineral supplements as recommended by your health care provider. What happens during an annual well check? The services and screenings done by your health care provider during your annual well check will depend on your age, overall health, lifestyle risk factors, and family history of disease. Counseling  Your health care provider may ask you questions about your: Alcohol use. Tobacco use. Drug use. Emotional well-being. Home and relationship well-being. Sexual activity. Eating habits. History of falls. Memory and ability to understand (cognition). Work and work Statistician. Screening  You may have the following tests or measurements: Height, weight, and BMI. Blood pressure. Lipid and cholesterol levels. These may be checked every 5 years, or more frequently if you are over 42 years old. Skin check. Lung cancer screening. You may have this screening every year starting at age 80 if you have a 30-pack-year history of smoking and currently smoke or have quit within the past 15 years. Fecal occult blood test (FOBT) of the stool. You may have this test every year starting at age 51. Flexible sigmoidoscopy or colonoscopy. You may have a sigmoidoscopy every 5 years or a colonoscopy every 10 years starting at age 24. Prostate cancer screening. Recommendations will vary depending on your family history and other risks. Hepatitis C blood test. Hepatitis B blood test. Sexually transmitted disease (STD) testing. Diabetes screening. This is done by checking your blood sugar (glucose) after you have  not eaten for a while (fasting). You may have this done every 1-3 years. Abdominal aortic aneurysm (AAA) screening. You may need this if you are a current or former smoker. Osteoporosis. You may be screened starting at age 55 if you are at high risk. Talk with your health care provider about your test results, treatment options, and if necessary, the need for more  tests. Vaccines  Your health care provider may recommend certain vaccines, such as: Influenza vaccine. This is recommended every year. Tetanus, diphtheria, and acellular pertussis (Tdap, Td) vaccine. You may need a Td booster every 10 years. Zoster vaccine. You may need this after age 71. Pneumococcal 13-valent conjugate (PCV13) vaccine. One dose is recommended after age 65. Pneumococcal polysaccharide (PPSV23) vaccine. One dose is recommended after age 15. Talk to your health care provider about which screenings and vaccines you need and how often you need them. This information is not intended to replace advice given to you by your health care provider. Make sure you discuss any questions you have with your health care provider. Document Released: 02/22/2015 Document Revised: 10/16/2015 Document Reviewed: 11/27/2014 Elsevier Interactive Patient Education  2017 [employer] Prevention in the Home Falls can cause injuries. They can happen to people of all ages. There are many things you can do to make your home safe and to help prevent falls. What can I do on the outside of my home? Regularly fix the edges of walkways and driveways and fix any cracks. Remove anything that might make you trip as you walk through a door, such as a raised step or threshold. Trim any bushes or trees on the path to your home. Use bright outdoor lighting. Clear any walking paths of anything that might make someone trip, such as rocks or tools. Regularly check to see if handrails are loose or broken. Make sure that both sides of any steps have handrails. Any raised decks and porches should have guardrails on the edges. Have any leaves, snow, or ice cleared regularly. Use sand or salt on walking paths during winter. Clean up any spills in your garage right away. This includes oil or grease spills. What can I do in the bathroom? Use night lights. Install grab bars by the toilet and in the tub and shower.  Do not use towel bars as grab bars. Use non-skid mats or decals in the tub or shower. If you need to sit down in the shower, use a plastic, non-slip stool. Keep the floor dry. Clean up any water that spills on the floor as soon as it happens. Remove soap buildup in the tub or shower regularly. Attach bath mats securely with double-sided non-slip rug tape. Do not have throw rugs and other things on the floor that can make you trip. What can I do in the bedroom? Use night lights. Make sure that you have a light by your bed that is easy to reach. Do not use any sheets or blankets that are too big for your bed. They should not hang down onto the floor. Have a firm chair that has side arms. You can use this for support while you get dressed. Do not have throw rugs and other things on the floor that can make you trip. What can I do in the kitchen? Clean up any spills right away. Avoid walking on wet floors. Keep items that you use a lot in easy-to-reach places. If you need to reach something above you, use a strong step stool  that has a grab bar. Keep electrical cords out of the way. Do not use floor polish or wax that makes floors slippery. If you must use wax, use non-skid floor wax. Do not have throw rugs and other things on the floor that can make you trip. What can I do with my stairs? Do not leave any items on the stairs. Make sure that there are handrails on both sides of the stairs and use them. Fix handrails that are broken or loose. Make sure that handrails are as long as the stairways. Check any carpeting to make sure that it is firmly attached to the stairs. Fix any carpet that is loose or worn. Avoid having throw rugs at the top or bottom of the stairs. If you do have throw rugs, attach them to the floor with carpet tape. Make sure that you have a light switch at the top of the stairs and the bottom of the stairs. If you do not have them, ask someone to add them for you. What else  can I do to help prevent falls? Wear shoes that: Do not have high heels. Have rubber bottoms. Are comfortable and fit you well. Are closed at the toe. Do not wear sandals. If you use a stepladder: Make sure that it is fully opened. Do not climb a closed stepladder. Make sure that both sides of the stepladder are locked into place. Ask someone to hold it for you, if possible. Clearly mark and make sure that you can see: Any grab bars or handrails. First and last steps. Where the edge of each step is. Use tools that help you move around (mobility aids) if they are needed. These include: Canes. Walkers. Scooters. Crutches. Turn on the lights when you go into a dark area. Replace any light bulbs as soon as they burn out. Set up your furniture so you have a clear path. Avoid moving your furniture around. If any of your floors are uneven, fix them. If there are any pets around you, be aware of where they are. Review your medicines with your doctor. Some medicines can make you feel dizzy. This can increase your chance of falling. Ask your doctor what other things that you can do to help prevent falls. This information is not intended to replace advice given to you by your health care provider. Make sure you discuss any questions you have with your health care provider. Document Released: 11/22/2008 Document Revised: 07/04/2015 Document Reviewed: 03/02/2014 Elsevier Interactive Patient Education  2017 [employer].

## 2022-04-30 NOTE — Progress Notes (Signed)
Subjective:   Richard Arnold. is a 72 y.o. male who presents for Medicare Annual/Subsequent preventive examination.  I connected with  Richard Arnold. on 04/30/22 by a audio enabled telemedicine application and verified that I am speaking with the correct person using two identifiers.  Patient Location: Home  Provider Location: Office/Clinic  I discussed the limitations of evaluation and management by telemedicine. The patient expressed understanding and agreed to proceed.  Review of Systems     Cardiac Risk Factors include: advanced age (>72men, >36 women);male gender;hypertension;dyslipidemia     Objective:    Today's Vitals   04/30/22 1110  Weight: 206 lb (93.4 kg)  Height: 5\' 8"  (1.727 m)   Body mass index is 31.32 kg/m.     04/30/2022   11:13 AM 10/18/2020   10:10 AM 08/19/2020    9:10 AM 05/03/2020    9:26 AM 05/19/2018   12:17 PM 01/07/2016   12:40 PM 03/05/2015   11:35 AM  Advanced Directives  Does Patient Have a Medical Advance Directive? No No No No No No No  Would patient like information on creating a medical advance directive? No - Patient declined No - Patient declined No - Patient declined Yes (MAU/Ambulatory/Procedural Areas - Information given) No - Patient declined No - Patient declined     Current Medications (verified) Outpatient Encounter Medications as of 04/30/2022  Medication Sig   acetaminophen (TYLENOL) 650 MG CR tablet Take 650 mg by mouth every 8 (eight) hours as needed for pain.   aspirin EC 81 MG tablet Take 81 mg by mouth daily. Swallow whole.   atorvastatin (LIPITOR) 20 MG tablet Take 1 tablet (20 mg total) by mouth daily at 6 (six) AM.   diphenhydrAMINE (BENADRYL) 25 MG tablet Take 25 mg by mouth 2 (two) times daily.   ezetimibe (ZETIA) 10 MG tablet TAKE 1 TABLET BY MOUTH EVERY DAY   fish oil-omega-3 fatty acids 1000 MG capsule Take 2 g by mouth daily.   furosemide (LASIX) 20 MG tablet TAKE 1 TABLET BY MOUTH EVERY DAY   metoprolol  tartrate (LOPRESSOR) 25 MG tablet TAKE 0.5 TABLETS BY MOUTH 2 TIMES DAILY.   Multiple Vitamin (MULTIVITAMIN WITH MINERALS) TABS tablet Take 1 tablet by mouth daily.   nitroGLYCERIN (NITROSTAT) 0.4 MG SL tablet Place 1 tablet (0.4 mg total) under the tongue every 5 (five) minutes as needed for chest pain. You can take up to 3 tablets   pantoprazole (PROTONIX) 40 MG tablet TAKE 1 TABLET BY MOUTH TWICE A DAY BEFORE A MEAL   primidone (MYSOLINE) 50 MG tablet TAKE 2 TABLETS BY MOUTH AT BEDTIME   tamsulosin (FLOMAX) 0.4 MG CAPS capsule TAKE 1 CAPSULE BY MOUTH EVERY DAY   TURMERIC PO Take 1 capsule by mouth daily.   valACYclovir (VALTREX) 1000 MG tablet Take 2 tablets (2,000 mg total) by mouth 2 (two) times daily.   vitamin C (ASCORBIC ACID) 500 MG tablet Take 500 mg by mouth daily.   Zinc 50 MG TABS Take 50 mg by mouth daily.   No facility-administered encounter medications on file as of 04/30/2022.    Allergies (verified) Bismuth subsalicylate, Monosodium glutamate, and Latex   History: Past Medical History:  Diagnosis Date   Arthritis    Brachial neuritis    Gastric ulcer    GI bleed    H pylori ulcer    Hypercholesteremia    Turner syndrome    left arm, some numbness in fingers   Past  Surgical History:  Procedure Laterality Date   COLONOSCOPY WITH PROPOFOL N/A 03/05/2015   Procedure: COLONOSCOPY WITH PROPOFOL;  Surgeon: Garlan Fair, MD;  Location: [facility];  Service: Endoscopy;  Laterality: N/A;   ESOPHAGOGASTRODUODENOSCOPY  09/08/2011   Procedure: ESOPHAGOGASTRODUODENOSCOPY (EGD);  Surgeon: Winfield Cunas., MD;  Location: [facility];  Service: Endoscopy;  Laterality: N/A;   HERNIA REPAIR     INGUINAL HERNIA REPAIR Right 08/30/2013   Procedure: HERNIA REPAIR INGUINAL WITH MESH  ADULT;  Surgeon: Pedro Earls, MD;  Location: [facility];  Service: General;  Laterality: Right;   LEFT HEART CATH AND CORONARY ANGIOGRAPHY N/A 08/19/2020   Procedure: LEFT HEART CATH AND CORONARY  ANGIOGRAPHY;  Surgeon: Martinique, Peter M, MD;  Location: [facility];  Service: Cardiovascular;  Laterality: N/A;   SHOULDER ARTHROSCOPY  2011   x4  one open rt, rotator cuff tear, Dr. Amedeo Plenty   TOTAL SHOULDER ARTHROPLASTY  01/30/2011   Procedure: TOTAL SHOULDER ARTHROPLASTY;  Surgeon: Augustin Schooling;  Location: [place];  Service: Orthopedics;  Laterality: Right;  right total shoulder atrhoplasty   Family History  Problem Relation Age of Onset   Heart disease Mother    Heart disease Father    Social History   Socioeconomic History   Marital status: Married    Spouse name: Not on file   Number of children: Not on file   Years of education: Not on file   Highest education level: Not on file  Occupational History   Not on file  Tobacco Use   Smoking status: Never   Smokeless tobacco: Never  Vaping Use   Vaping Use: Never used  Substance and Sexual Activity   Alcohol use: No   Drug use: No   Sexual activity: Not on file  Other Topics Concern   Not on file  Social History Narrative   Not on file   Social Determinants of Health   Financial Resource Strain: Low Risk  (04/30/2022)   Overall Financial Resource Strain (CARDIA)    Difficulty of Paying Living Expenses: Not hard at all  Food Insecurity: No Food Insecurity (04/30/2022)   Hunger Vital Sign    Worried About Running Out of Food in the Last Year: Never true    [place] in the Last Year: Never true  Transportation Needs: No Transportation Needs (04/30/2022)   PRAPARE - Hydrologist (Medical): No    Lack of Transportation (Non-Medical): No  Physical Activity: Sufficiently Active (04/30/2022)   Exercise Vital Sign    Days of Exercise per Week: 4 days    Minutes of Exercise per Session: 70 min  Stress: No Stress Concern Present (04/30/2022)   Newborn    Feeling of Stress : Not at all  Social Connections: [place] (04/30/2022)   Social Connection and Isolation Panel [NHANES]    Frequency of Communication with Friends and Family: More than three times a week    Frequency of Social Gatherings with Friends and Family: More than three times a week    Attends Religious Services: 1 to 4 times per year    Active Member of [employer] or Organizations: Yes    Attends Music therapist: More than 4 times per year    Marital Status: Married    Tobacco Counseling Counseling given: Not Answered   Clinical Intake:  Pre-visit preparation completed: Yes  Pain : No/denies  pain  Diabetes: No  How often do you need to have someone help you when you read instructions, pamphlets, or other written materials from your doctor or pharmacy?: 1 - Never  Diabetic?No   Interpreter Needed?: No  Information entered by :: Denman George LPN   Activities of Daily Living    04/30/2022   11:13 AM 04/29/2022    2:07 PM  In your present state of health, do you have any difficulty performing the following activities:  Hearing? 1 1  Vision? 0 0  Difficulty concentrating or making decisions? 0 0  Walking or climbing stairs? 0 0  Dressing or bathing? 0 0  Doing errands, shopping? 0 0  Preparing Food and eating ? N N  Using the Toilet? N N  In the past six months, have you accidently leaked urine? N N  Do you have problems with loss of bowel control? N N  Managing your Medications? N N  Managing your Finances? N N  Housekeeping or managing your Housekeeping? N N    Patient Care Team: Susy Frizzle, MD as PCP - General (Family Medicine) Werner Lean, MD as PCP - Cardiology (Cardiology)  Indicate any recent Medical Services you may have received from other than [facility] providers in the past year (date may be approximate).     Assessment:   This is a routine wellness examination for [place].  Hearing/Vision screen Hearing Screening - Comments:: Denies hearing difficulties   Vision  Screening - Comments:: up to date with routine eye exams with Dr. Sabra Heck    Dietary issues and exercise activities discussed: Current Exercise Habits: Home exercise routine, Type of exercise: walking;stretching, Time (Minutes): 60, Frequency (Times/Week): 4, Weekly Exercise (Minutes/Week): 240, Intensity: Mild   Goals Addressed             This Visit's Progress    Increase physical activity        Depression Screen    04/30/2022   11:12 AM 10/18/2020   11:05 AM 10/18/2020   10:03 AM 05/03/2020    9:25 AM 05/01/2019    8:10 AM 04/26/2018    8:27 AM 04/19/2017   10:54 AM  PHQ 2/9 Scores  PHQ - 2 Score 0 0 0 0 0 0 0    Fall Risk    04/30/2022   11:11 AM 04/29/2022    2:07 PM 10/18/2020   10:03 AM 05/03/2020    9:25 AM 05/01/2019    8:10 AM  Fall Risk   Falls in the past year? 0 0 0 0 0  Number falls in past yr: 0      Injury with Fall? 0      Risk for fall due to : No Fall Risks   No Fall Risks   Follow up Falls prevention discussed;Education provided;Falls evaluation completed   Falls evaluation completed Falls evaluation completed    FALL RISK PREVENTION PERTAINING TO THE HOME:  Any stairs in or around the home? Yes  If so, are there any without handrails? No  Home free of loose throw rugs in walkways, pet beds, electrical cords, etc? Yes  Adequate lighting in your home to reduce risk of falls? Yes   ASSISTIVE DEVICES UTILIZED TO PREVENT FALLS:  Life alert? No  Use of a cane, walker or w/c? No  Grab bars in the bathroom? Yes  Shower chair or bench in shower? No  Elevated toilet seat or a handicapped toilet? Yes   TIMED UP AND GO:  Was the test performed? No . Telephonic visit   Cognitive Function:        04/30/2022   11:14 AM  6CIT Screen  What Year? 0 points  What month? 0 points  What time? 0 points  Count back from 20 0 points  Months in reverse 0 points  Repeat phrase 0 points  Total Score 0 points    Immunizations Immunization History   Administered Date(s) Administered   Influenza, High Dose Seasonal PF 11/11/2017   Influenza-Unspecified 11/23/2016, 11/29/2018, 11/10/2019, 11/09/2021   PFIZER(Purple Top)SARS-COV-2 Vaccination 04/17/2019, 04/20/2019   Pneumococcal Conjugate-13 04/19/2017   Pneumococcal Polysaccharide-23 05/01/2019    TDAP status: Due, Education has been provided regarding the importance of this vaccine. Advised may receive this vaccine at local pharmacy or Health Dept. Aware to provide a copy of the vaccination record if obtained from local pharmacy or Health Dept. Verbalized acceptance and understanding.  Flu Vaccine status: Up to date  Pneumococcal vaccine status: Up to date  Covid-19 vaccine status: Information provided on how to obtain vaccines.   Qualifies for Shingles Vaccine? Yes   Zostavax completed No   Shingrix Completed?: No.    Education has been provided regarding the importance of this vaccine. Patient has been advised to call insurance company to determine out of pocket expense if they have not yet received this vaccine. Advised may also receive vaccine at local pharmacy or Health Dept. Verbalized acceptance and understanding.  Screening Tests Health Maintenance  Topic Date Due   DTaP/Tdap/Td (1 - Tdap) Never done   Zoster Vaccines- Shingrix (1 of 2) Never done   COVID-19 Vaccine (3 - 2023-24 season) 10/10/2021   Medicare Annual Wellness (AWV)  04/30/2023   COLONOSCOPY (Pts 45-82yrs Insurance coverage will need to be confirmed)  03/04/2025   Pneumonia Vaccine 23+ Years old  Completed   INFLUENZA VACCINE  Completed   Hepatitis C Screening  Completed   HPV VACCINES  Aged Out    Health Maintenance  Health Maintenance Due  Topic Date Due   DTaP/Tdap/Td (1 - Tdap) Never done   Zoster Vaccines- Shingrix (1 of 2) Never done   COVID-19 Vaccine (3 - 2023-24 season) 10/10/2021    Colorectal cancer screening: Type of screening: Colonoscopy. Completed 03/05/15. Repeat every 10  years  Lung Cancer Screening: (Low Dose CT Chest recommended if Age 64-80 years, 30 pack-year currently smoking OR have quit w/in 15years.) does not qualify.   Lung Cancer Screening Referral: n/a  Additional Screening:  Hepatitis C Screening: does qualify; Completed 04/19/17  Vision Screening: Recommended annual ophthalmology exams for early detection of glaucoma and other disorders of the eye. Is the patient up to date with their annual eye exam?  Yes  Who is the provider or what is the name of the office in which the patient attends annual eye exams? Dr. Sabra Heck  If pt is not established with a provider, would they like to be referred to a provider to establish care? No .   Dental Screening: Recommended annual dental exams for proper oral hygiene  Community Resource Referral / Chronic Care Management: CRR required this visit?  No   CCM required this visit?  No      Plan:     I have personally reviewed and noted the following in the patient's chart:   Medical and social history Use of alcohol, tobacco or illicit drugs  Current medications and supplements including opioid prescriptions. Patient is not currently taking opioid prescriptions. Functional ability and status  Nutritional status Physical activity Advanced directives List of other physicians Hospitalizations, surgeries, and ER visits in previous 12 months Vitals Screenings to include cognitive, depression, and falls Referrals and appointments  In addition, I have reviewed and discussed with patient certain preventive protocols, quality metrics, and best practice recommendations. A written personalized care plan for preventive services as well as general preventive health recommendations were provided to patient.     Vanetta Mulders, [place]   [id number]   Due to this being a virtual visit, the after visit summary with patients personalized plan was offered to patient via mail or my-chart.  Patient would like  to access on my-chart  Nurse Notes: No concerns

## 2022-05-06 ENCOUNTER — Encounter: Payer: Self-pay | Admitting: Pharmacist

## 2022-05-07 ENCOUNTER — Ambulatory Visit (INDEPENDENT_AMBULATORY_CARE_PROVIDER_SITE_OTHER): Payer: Medicare HMO | Admitting: Family Medicine

## 2022-05-07 VITALS — BP 120/62 | HR 67 | Temp 97.9°F | Ht 69.0 in | Wt 204.2 lb

## 2022-05-07 DIAGNOSIS — Z125 Encounter for screening for malignant neoplasm of prostate: Secondary | ICD-10-CM

## 2022-05-07 DIAGNOSIS — Z23 Encounter for immunization: Secondary | ICD-10-CM | POA: Diagnosis not present

## 2022-05-07 DIAGNOSIS — Z0000 Encounter for general adult medical examination without abnormal findings: Secondary | ICD-10-CM

## 2022-05-07 DIAGNOSIS — Z1211 Encounter for screening for malignant neoplasm of colon: Secondary | ICD-10-CM

## 2022-05-07 DIAGNOSIS — Z0001 Encounter for general adult medical examination with abnormal findings: Secondary | ICD-10-CM

## 2022-05-07 DIAGNOSIS — I10 Essential (primary) hypertension: Secondary | ICD-10-CM

## 2022-05-07 DIAGNOSIS — R5383 Other fatigue: Secondary | ICD-10-CM

## 2022-05-07 NOTE — Addendum Note (Signed)
Addended by: Chriss Driver on: 05/07/2022 09:49 AM   Modules accepted: Orders

## 2022-05-07 NOTE — Progress Notes (Signed)
Subjective:    Patient ID: Richard Beat., male    DOB: Jan 05, 1951, 72 y.o.   MRN: [id number]  HPI Patient is here today for complete physical exam.  Last colonoscopy that I have records of this from 2017.  He had a tubular adenoma at that time.  He is not certain if he had a colonoscopy recently.  Therefore I will contact his gastroenterologist to determine if he is due again for colonoscopy.  He is due for prostate cancer screening.  He endorses chronic fatigue.  He denies any sleep apnea or snoring or gasping sounds of his breathing.  He denies any hypersomnolence.  He states that he just does not have as much energy and physical stamina as he would expect.  He denies any chest pain or shortness of breath or dyspnea on exertion.  His blood pressure today is excellent.  His immunizations are up-to-date except for the shingles vaccine. Past Medical History:  Diagnosis Date   Arthritis    Brachial neuritis    Gastric ulcer    GI bleed    H pylori ulcer    Hypercholesteremia    Turner syndrome    left arm, some numbness in fingers   Past Surgical History:  Procedure Laterality Date   COLONOSCOPY WITH PROPOFOL N/A 03/05/2015   Procedure: COLONOSCOPY WITH PROPOFOL;  Surgeon: Garlan Fair, MD;  [facility]: [facility];  Service: Endoscopy;  Laterality: N/A;   ESOPHAGOGASTRODUODENOSCOPY  09/08/2011   Procedure: ESOPHAGOGASTRODUODENOSCOPY (EGD);  Surgeon: Winfield Cunas., MD;  Location: [facility];  Service: Endoscopy;  Laterality: N/A;   HERNIA REPAIR     INGUINAL HERNIA REPAIR Right 08/30/2013   Procedure: HERNIA REPAIR INGUINAL WITH MESH  ADULT;  Surgeon: Pedro Earls, MD;  Location: [facility];  Service: General;  Laterality: Right;   LEFT HEART CATH AND CORONARY ANGIOGRAPHY N/A 08/19/2020   Procedure: LEFT HEART CATH AND CORONARY ANGIOGRAPHY;  Surgeon: Martinique, Peter M, MD;  Location: [place] [facility];  Service: Cardiovascular;  Laterality: N/A;   SHOULDER ARTHROSCOPY  2011    x4  one open rt, rotator cuff tear, Dr. Amedeo Plenty   TOTAL SHOULDER ARTHROPLASTY  01/30/2011   Procedure: TOTAL SHOULDER ARTHROPLASTY;  Surgeon: Augustin Schooling;  Location: [place] [facility];  Service: Orthopedics;  Laterality: Right;  right total shoulder atrhoplasty   Current Outpatient Medications on File Prior to Visit  Medication Sig Dispense Refill   acetaminophen (TYLENOL) 650 MG CR tablet Take 650 mg by mouth every 8 (eight) hours as needed for pain.     aspirin EC 81 MG tablet Take 81 mg by mouth daily. Swallow whole.     atorvastatin (LIPITOR) 20 MG tablet Take 1 tablet (20 mg total) by mouth daily at 6 (six) AM. 90 tablet 3   diphenhydrAMINE (BENADRYL) 25 MG tablet Take 25 mg by mouth 2 (two) times daily.     ezetimibe (ZETIA) 10 MG tablet TAKE 1 TABLET BY MOUTH EVERY DAY 90 tablet 3   fish oil-omega-3 fatty acids 1000 MG capsule Take 2 g by mouth daily.     furosemide (LASIX) 20 MG tablet TAKE 1 TABLET BY MOUTH EVERY DAY 90 tablet 3   metoprolol tartrate (LOPRESSOR) 25 MG tablet TAKE 0.5 TABLETS BY MOUTH 2 TIMES DAILY. 90 tablet 3   Multiple Vitamin (MULTIVITAMIN WITH MINERALS) TABS tablet Take 1 tablet by mouth daily.     nitroGLYCERIN (NITROSTAT) 0.4 MG SL tablet Place 1 tablet (0.4 mg total)  under the tongue every 5 (five) minutes as needed for chest pain. You can take up to 3 tablets 25 tablet 3   pantoprazole (PROTONIX) 40 MG tablet TAKE 1 TABLET BY MOUTH TWICE A DAY BEFORE A MEAL 180 tablet 2   primidone (MYSOLINE) 50 MG tablet TAKE 2 TABLETS BY MOUTH AT BEDTIME 180 tablet 0   tamsulosin (FLOMAX) 0.4 MG CAPS capsule TAKE 1 CAPSULE BY MOUTH EVERY DAY 90 capsule 0   TURMERIC PO Take 1 capsule by mouth daily.     valACYclovir (VALTREX) 1000 MG tablet Take 2 tablets (2,000 mg total) by mouth 2 (two) times daily. 4 tablet 0   vitamin C (ASCORBIC ACID) 500 MG tablet Take 500 mg by mouth daily.     Zinc 50 MG TABS Take 50 mg by mouth daily.     No current facility-administered medications on file  prior to visit.   Allergies  Allergen Reactions   Bismuth Subsalicylate     Other reaction(s): hives   Monosodium Glutamate Swelling    Rash, throat swells   Latex Rash   Social History   Socioeconomic History   Marital status: Married    Spouse name: Not on file   Number of children: Not on file   Years of education: Not on file   Highest education level: Not on file  Occupational History   Not on file  Tobacco Use   Smoking status: Never   Smokeless tobacco: Never  Vaping Use   Vaping Use: Never used  Substance and Sexual Activity   Alcohol use: No   Drug use: No   Sexual activity: Not on file  Other Topics Concern   Not on file  Social History Narrative   Not on file   Social Determinants of Health   Financial Resource Strain: Low Risk  (04/30/2022)   Overall Financial Resource Strain (CARDIA)    Difficulty of Paying Living Expenses: Not hard at all  Food Insecurity: No Food Insecurity (04/30/2022)   Hunger Vital Sign    Worried About Running Out of Food in the Last Year: Never true    Ran Out of Food in the Last Year: Never true  Transportation Needs: No Transportation Needs (04/30/2022)   PRAPARE - Hydrologist (Medical): No    Lack of Transportation (Non-Medical): No  Physical Activity: Sufficiently Active (04/30/2022)   Exercise Vital Sign    Days of Exercise per Week: 4 days    Minutes of Exercise per Session: 70 min  Stress: No Stress Concern Present (04/30/2022)   [place]    Feeling of Stress : Not at all  Social Connections: [place] (04/30/2022)   Social Connection and Isolation Panel [NHANES]    Frequency of Communication with Friends and Family: More than three times a week    Frequency of Social Gatherings with Friends and Family: More than three times a week    Attends Religious Services: 1 to 4 times per year    Active Member of [employer] or  Organizations: Yes    Attends Archivist Meetings: More than 4 times per year    Marital Status: Married  Human resources officer Violence: Not At Risk (04/30/2022)   Humiliation, Afraid, Rape, and Kick questionnaire    Fear of Current or Ex-Partner: No    Emotionally Abused: No    Physically Abused: No    Sexually Abused: No   Family History  Problem Relation Age of Onset   Heart disease Mother    Heart disease Father    Family history is significant for a history of CABG in his father later in life   Review of Systems  All other systems reviewed and are negative.      Objective:   Physical Exam Vitals reviewed.  Constitutional:      General: He is not in acute distress.    Appearance: He is well-developed. He is not diaphoretic.  HENT:     Head: Normocephalic and atraumatic.     Right Ear: External ear normal.     Left Ear: External ear normal.     Nose: Nose normal.     Mouth/Throat:     Pharynx: No oropharyngeal exudate.  Eyes:     General: No scleral icterus.       Right eye: No discharge.        Left eye: No discharge.     Conjunctiva/sclera: Conjunctivae normal.     Pupils: Pupils are equal, round, and reactive to light.  Neck:     Thyroid: No thyromegaly.     Vascular: No JVD.     Trachea: No tracheal deviation.  Cardiovascular:     Rate and Rhythm: Normal rate and regular rhythm.     Heart sounds: Normal heart sounds. No murmur heard.    No friction rub. No gallop.  Pulmonary:     Effort: Pulmonary effort is normal. No respiratory distress.     Breath sounds: Normal breath sounds. No stridor. No wheezing or rales.  Chest:     Chest wall: No tenderness.  Abdominal:     General: Bowel sounds are normal. There is no distension.     Palpations: Abdomen is soft. There is no mass.     Tenderness: There is no abdominal tenderness. There is no guarding or rebound.  Genitourinary:    Penis: Normal.      Prostate: Normal.     Rectum: Normal.   Musculoskeletal:        General: No tenderness or deformity. Normal range of motion.     Cervical back: Normal range of motion and neck supple.  Lymphadenopathy:     Cervical: No cervical adenopathy.  Skin:    General: Skin is warm.     Coloration: Skin is not pale.     Findings: No erythema or rash.  Neurological:     Mental Status: He is alert and oriented to person, place, and time.     Cranial Nerves: No cranial nerve deficit.     Motor: No abnormal muscle tone.     Coordination: Coordination normal.     Deep Tendon Reflexes: Reflexes are normal and symmetric.  Psychiatric:        Behavior: Behavior normal.        Thought Content: Thought content normal.        Judgment: Judgment normal.           Assessment & Plan:  Benign essential HTN - Plan: CBC with Differential/Platelet, Lipid panel, COMPLETE METABOLIC PANEL WITH GFR  Fatigue, unspecified type - Plan: TSH, Testosterone Total,Free,Bio, Males  Prostate cancer screening - Plan: PSA  Colon cancer screening - Plan: Ambulatory referral to Gastroenterology  Encounter for Medicare annual wellness exam Consult GI for colonoscopy as this appears to be due.  Check a PSA to screen for prostate cancer.  Patient received his shingles vaccine today which updated his immunization records.  Blood pressure is  outstanding.  Check CBC CMP and lipid panel.  Goal LDL cholesterol less than 100.  Due to his chronic fatigue I will also check a TSH.  I will check a testosterone level as well as a CBC.  He denies any depression or memory loss.

## 2022-05-08 ENCOUNTER — Ambulatory Visit: Payer: Self-pay

## 2022-05-08 ENCOUNTER — Telehealth: Payer: Medicare HMO | Admitting: Physician Assistant

## 2022-05-08 ENCOUNTER — Encounter: Payer: Self-pay | Admitting: Physician Assistant

## 2022-05-08 DIAGNOSIS — R051 Acute cough: Secondary | ICD-10-CM

## 2022-05-08 DIAGNOSIS — R0982 Postnasal drip: Secondary | ICD-10-CM | POA: Diagnosis not present

## 2022-05-08 LAB — TIQ- MISLABELED: DATE RECEIVED:: 3282024

## 2022-05-08 MED ORDER — BENZONATATE 100 MG PO CAPS: 100.0000 mg | ORAL_CAPSULE | Freq: Three times a day (TID) | ORAL | 0 refills | Status: DC | PRN

## 2022-05-08 MED ORDER — PROMETHAZINE-DM 6.25-15 MG/5ML PO SYRP: 5.0000 mL | ORAL_SOLUTION | Freq: Four times a day (QID) | ORAL | 0 refills | Status: DC | PRN

## 2022-05-08 NOTE — Progress Notes (Signed)
Virtual Visit Consent   Richard Beat., you are scheduled for a virtual visit with a [facility] provider today. Just as with appointments in the office, your consent must be obtained to participate. Your consent will be active for this visit and any virtual visit you may have with one of our providers in the next 365 days. If you have a MyChart account, a copy of this consent can be sent to you electronically.  As this is a virtual visit, video technology does not allow for your provider to perform a traditional examination. This may limit your provider's ability to fully assess your condition. If your provider identifies any concerns that need to be evaluated in person or the need to arrange testing (such as labs, EKG, etc.), we will make arrangements to do so. Although advances in technology are sophisticated, we cannot ensure that it will always work on either your end or our end. If the connection with a video visit is poor, the visit may have to be switched to a telephone visit. With either a video or telephone visit, we are not always able to ensure that we have a secure connection.  By engaging in this virtual visit, you consent to the provision of healthcare and authorize for your insurance to be billed (if applicable) for the services provided during this visit. Depending on your insurance coverage, you may receive a charge related to this service.  I need to obtain your verbal consent now. Are you willing to proceed with your visit today? Richard Beat. has provided verbal consent on 05/08/2022 for a virtual visit (video or telephone). Mar Daring, PA-C  Date: 05/08/2022 3:25 PM  Virtual Visit via Video Note   I, Mar Daring, connected with  Richard Bonwell.  ([id number], [birth year]) on 05/08/22 at  3:15 PM EDT by a video-enabled telemedicine application and verified that I am speaking with the correct person using two identifiers.  Location: Patient: Virtual Visit  Location Patient: Home Provider: Virtual Visit Location Provider: Home Office   I discussed the limitations of evaluation and management by telemedicine and the availability of in person appointments. The patient expressed understanding and agreed to proceed.    History of Present Illness: Richard Benjamin. is a 72 y.o. who identifies as a male who was assigned male at birth, and is being seen today for cough.  HPI: Cough This is a new problem. The current episode started in the past 7 days. The problem has been gradually worsening. The problem occurs every few minutes. The cough is Productive of sputum and productive of purulent sputum. Associated symptoms include myalgias (had shingles vaccine yesterday contributing to this), nasal congestion and postnasal drip. Pertinent negatives include no chills, rhinorrhea, sore throat or sweats. The symptoms are aggravated by lying down. He has tried prescription cough suppressant (had been using an old Rx for hydrocodone based cough syrup) for the symptoms. The treatment provided mild relief.     Problems:  Patient Active Problem List   Diagnosis Date Noted   Coronary artery disease of native artery of native heart with stable angina pectoris ([place]) 09/03/2020   Myalgia due to statin 09/03/2020   Aortic atherosclerosis ([place]) 08/14/2020   Essential tremor 08/14/2020   Hypomagnesemia 05/20/2018   Gastrointestinal hemorrhage with melena 05/19/2018   Hypercholesteremia 05/19/2018   S/P right inguinal hernia repair July 2015 08/30/2013   [place]-Turner syndrome-left upper extremity 08/22/2013   Osteoarthrosis, unspecified whether generalized or  localized, shoulder region 01/30/2011    Allergies:  Allergies  Allergen Reactions   Bismuth Subsalicylate     Other reaction(s): hives   Monosodium Glutamate Swelling    Rash, throat swells   Latex Rash   Medications:  Current Outpatient Medications:    benzonatate (TESSALON) 100 MG capsule, Take 1  capsule (100 mg total) by mouth 3 (three) times daily as needed., Disp: 30 capsule, Rfl: 0   promethazine-dextromethorphan (PROMETHAZINE-DM) 6.25-15 MG/5ML syrup, Take 5 mLs by mouth 4 (four) times daily as needed., Disp: 118 mL, Rfl: 0   acetaminophen (TYLENOL) 650 MG CR tablet, Take 650 mg by mouth every 8 (eight) hours as needed for pain., Disp: , Rfl:    aspirin EC 81 MG tablet, Take 81 mg by mouth daily. Swallow whole., Disp: , Rfl:    atorvastatin (LIPITOR) 20 MG tablet, Take 1 tablet (20 mg total) by mouth daily at 6 (six) AM., Disp: 90 tablet, Rfl: 3   diphenhydrAMINE (BENADRYL) 25 MG tablet, Take 25 mg by mouth 2 (two) times daily., Disp: , Rfl:    ezetimibe (ZETIA) 10 MG tablet, TAKE 1 TABLET BY MOUTH EVERY DAY, Disp: 90 tablet, Rfl: 3   fish oil-omega-3 fatty acids 1000 MG capsule, Take 2 g by mouth daily., Disp: , Rfl:    furosemide (LASIX) 20 MG tablet, TAKE 1 TABLET BY MOUTH EVERY DAY, Disp: 90 tablet, Rfl: 3   metoprolol tartrate (LOPRESSOR) 25 MG tablet, TAKE 0.5 TABLETS BY MOUTH 2 TIMES DAILY., Disp: 90 tablet, Rfl: 3   Multiple Vitamin (MULTIVITAMIN WITH MINERALS) TABS tablet, Take 1 tablet by mouth daily., Disp: , Rfl:    nitroGLYCERIN (NITROSTAT) 0.4 MG SL tablet, Place 1 tablet (0.4 mg total) under the tongue every 5 (five) minutes as needed for chest pain. You can take up to 3 tablets, Disp: 25 tablet, Rfl: 3   pantoprazole (PROTONIX) 40 MG tablet, TAKE 1 TABLET BY MOUTH TWICE A DAY BEFORE A MEAL, Disp: 180 tablet, Rfl: 2   primidone (MYSOLINE) 50 MG tablet, TAKE 2 TABLETS BY MOUTH AT BEDTIME, Disp: 180 tablet, Rfl: 0   tamsulosin (FLOMAX) 0.4 MG CAPS capsule, TAKE 1 CAPSULE BY MOUTH EVERY DAY, Disp: 90 capsule, Rfl: 0   TURMERIC PO, Take 1 capsule by mouth daily., Disp: , Rfl:    valACYclovir (VALTREX) 1000 MG tablet, Take 2 tablets (2,000 mg total) by mouth 2 (two) times daily., Disp: 4 tablet, Rfl: 0   vitamin C (ASCORBIC ACID) 500 MG tablet, Take 500 mg by mouth daily.,  Disp: , Rfl:    Zinc 50 MG TABS, Take 50 mg by mouth daily., Disp: , Rfl:   Observations/Objective: Patient is well-developed, well-nourished in no acute distress.  Resting comfortably  Head is normocephalic, atraumatic.  No labored breathing.  Speech is clear and coherent with logical content.  Patient is alert and oriented at baseline.    Assessment and Plan: 1. Post-nasal drainage - promethazine-dextromethorphan (PROMETHAZINE-DM) 6.25-15 MG/5ML syrup; Take 5 mLs by mouth 4 (four) times daily as needed.  Dispense: 118 mL; Refill: 0 - benzonatate (TESSALON) 100 MG capsule; Take 1 capsule (100 mg total) by mouth 3 (three) times daily as needed.  Dispense: 30 capsule; Refill: 0  2. Acute cough - promethazine-dextromethorphan (PROMETHAZINE-DM) 6.25-15 MG/5ML syrup; Take 5 mLs by mouth 4 (four) times daily as needed.  Dispense: 118 mL; Refill: 0 - benzonatate (TESSALON) 100 MG capsule; Take 1 capsule (100 mg total) by mouth 3 (three) times daily as  needed.  Dispense: 30 capsule; Refill: 0  - Advised we could not, unfortunately, prescribed any narcotic based cough medications - Will give Promethazine DM and Tessalon perles - Continue allergy medications, especially flonase - Steam and humidifier can help - Saline nasal rinses/neti pot prior to bedtime - Push fluids - Rest as needed - Seek in person evaluation if continues to worsen or fails to improve  Follow Up Instructions: I discussed the assessment and treatment plan with the patient. The patient was provided an opportunity to ask questions and all were answered. The patient agreed with the plan and demonstrated an understanding of the instructions.  A copy of instructions were sent to the patient via MyChart unless otherwise noted below.    The patient was advised to call back or seek an in-person evaluation if the symptoms worsen or if the condition fails to improve as anticipated.  Time:  I spent 10 minutes with the patient  via telehealth technology discussing the above problems/concerns.    Mar Daring, PA-C

## 2022-05-08 NOTE — Telephone Encounter (Signed)
  Chief Complaint: cough Symptoms: cough, worse at night  Frequency: couple of days Pertinent Negatives: Patient denies SOB or fever Disposition: [] ED /[] Urgent Care (no appt availability in office) / [] Appointment(In office/virtual)/ [x]  [facility]/ [] Home Care/ [] Refused Recommended Disposition /[] [facility]/ []  Follow-up with PCP Additional Notes: Larene Beach called from practice with pt d/t cough and no appts. Pt had OV yesterday and said Dr. Dennard Schaumann was suppose to send in something. Advised him that he is OOO today. Scheduled virtual UC today at 1515. No further assistance needed.   Reason for Disposition  [1] Continuous (nonstop) coughing interferes with work or school AND [2] no improvement using cough treatment per Care Advice  Answer Assessment - Initial Assessment Questions 1. ONSET: "When did the cough begin?"      Couple of days ago  3. SPUTUM: "Describe the color of your sputum" (none, dry cough; clear, white, yellow, green)     no 5. DIFFICULTY BREATHING: "Are you having difficulty breathing?" If Yes, ask: "How bad is it?" (e.g., mild, moderate, severe)    - MILD: No SOB at rest, mild SOB with walking, speaks normally in sentences, can lie down, no retractions, pulse < 100.    - MODERATE: SOB at rest, SOB with minimal exertion and prefers to sit, cannot lie down flat, speaks in phrases, mild retractions, audible wheezing, pulse 100-120.    - SEVERE: Very SOB at rest, speaks in single words, struggling to breathe, sitting hunched forward, retractions, pulse > 120     no 7. CARDIAC HISTORY: "Do you have any history of heart disease?" (e.g., heart attack, congestive heart failure)      HTN 10. OTHER SYMPTOMS: "Do you have any other symptoms?" (e.g., runny nose, wheezing, chest pain)       Cough worse at night  Protocols used: Cough - Acute Productive-A-AH

## 2022-05-08 NOTE — Patient Instructions (Signed)
Richard Beat., thank you for joining Mar Daring, PA-C for today's virtual visit.  While this provider is not your primary care provider (PCP), if your PCP is located in our provider database this encounter information will be shared with them immediately following your visit.   [facility] account gives you access to today's visit and all your visits, tests, and labs performed at [facility] " click here if you don't have a [facility] account or go to [email or web address]  Consent: (Patient) Richard Beat. provided verbal consent for this virtual visit at the beginning of the encounter.  Current Medications:  Current Outpatient Medications:    benzonatate (TESSALON) 100 MG capsule, Take 1 capsule (100 mg total) by mouth 3 (three) times daily as needed., Disp: 30 capsule, Rfl: 0   promethazine-dextromethorphan (PROMETHAZINE-DM) 6.25-15 MG/5ML syrup, Take 5 mLs by mouth 4 (four) times daily as needed., Disp: 118 mL, Rfl: 0   acetaminophen (TYLENOL) 650 MG CR tablet, Take 650 mg by mouth every 8 (eight) hours as needed for pain., Disp: , Rfl:    aspirin EC 81 MG tablet, Take 81 mg by mouth daily. Swallow whole., Disp: , Rfl:    atorvastatin (LIPITOR) 20 MG tablet, Take 1 tablet (20 mg total) by mouth daily at 6 (six) AM., Disp: 90 tablet, Rfl: 3   diphenhydrAMINE (BENADRYL) 25 MG tablet, Take 25 mg by mouth 2 (two) times daily., Disp: , Rfl:    ezetimibe (ZETIA) 10 MG tablet, TAKE 1 TABLET BY MOUTH EVERY DAY, Disp: 90 tablet, Rfl: 3   fish oil-omega-3 fatty acids 1000 MG capsule, Take 2 g by mouth daily., Disp: , Rfl:    furosemide (LASIX) 20 MG tablet, TAKE 1 TABLET BY MOUTH EVERY DAY, Disp: 90 tablet, Rfl: 3   metoprolol tartrate (LOPRESSOR) 25 MG tablet, TAKE 0.5 TABLETS BY MOUTH 2 TIMES DAILY., Disp: 90 tablet, Rfl: 3   Multiple Vitamin (MULTIVITAMIN WITH MINERALS) TABS tablet, Take 1 tablet by mouth daily., Disp: , Rfl:    nitroGLYCERIN  (NITROSTAT) 0.4 MG SL tablet, Place 1 tablet (0.4 mg total) under the tongue every 5 (five) minutes as needed for chest pain. You can take up to 3 tablets, Disp: 25 tablet, Rfl: 3   pantoprazole (PROTONIX) 40 MG tablet, TAKE 1 TABLET BY MOUTH TWICE A DAY BEFORE A MEAL, Disp: 180 tablet, Rfl: 2   primidone (MYSOLINE) 50 MG tablet, TAKE 2 TABLETS BY MOUTH AT BEDTIME, Disp: 180 tablet, Rfl: 0   tamsulosin (FLOMAX) 0.4 MG CAPS capsule, TAKE 1 CAPSULE BY MOUTH EVERY DAY, Disp: 90 capsule, Rfl: 0   TURMERIC PO, Take 1 capsule by mouth daily., Disp: , Rfl:    valACYclovir (VALTREX) 1000 MG tablet, Take 2 tablets (2,000 mg total) by mouth 2 (two) times daily., Disp: 4 tablet, Rfl: 0   vitamin C (ASCORBIC ACID) 500 MG tablet, Take 500 mg by mouth daily., Disp: , Rfl:    Zinc 50 MG TABS, Take 50 mg by mouth daily., Disp: , Rfl:    Medications ordered in this encounter:  Meds ordered this encounter  Medications   promethazine-dextromethorphan (PROMETHAZINE-DM) 6.25-15 MG/5ML syrup    Sig: Take 5 mLs by mouth 4 (four) times daily as needed.    Dispense:  118 mL    Refill:  0    Order Specific Question:   Supervising Provider    Answer:   Chase Picket [id number]   benzonatate (TESSALON) 100 MG  capsule    Sig: Take 1 capsule (100 mg total) by mouth 3 (three) times daily as needed.    Dispense:  30 capsule    Refill:  0    Order Specific Question:   Supervising Provider    Answer:   Chase Picket [id number]     *If you need refills on other medications prior to your next appointment, please contact your pharmacy*  Follow-Up: Call back or seek an in-person evaluation if the symptoms worsen or if the condition fails to improve as anticipated.  [place] [phone]  Other Instructions  Postnasal Drip Postnasal drip is the feeling of mucus going down the back of your throat. Mucus is a slimy substance that moistens and cleans your nose and throat, as well as the air pockets  in face bones near your forehead and cheeks (sinuses). Small amounts of mucus pass from your nose and sinuses down the back of your throat all the time. This is normal. When you produce too much mucus or the mucus gets too thick, you can feel it. Some common causes of postnasal drip include: Having more mucus because of: A cold or the flu. Allergies. Cold air. Certain medicines. Gastroesophageal reflux. Having more mucus that is thicker because of: A sinus or nasal infection. Dry air. A food allergy. Follow these instructions at home: Relieving discomfort  Gargle with a mixture of salt and water 3-4 times a day or as needed. To make salt water, completely dissolve -1 tsp (3-6 g) of salt in 1 cup (237 mL) of warm water. If the air in your home is dry, use a humidifier to add moisture to the air. Use a saline spray or a container (neti pot) to flush out the nose (nasal irrigation). These methods can help clear away mucus and keep the nasal passages moist. General instructions Take over-the-counter and prescription medicines only as told by your health care provider. Follow instructions from your health care provider about eating or drinking restrictions. You may need to avoid caffeine. Avoid things that you know you are allergic to (allergens), like dust, mold, pollen, pets, or certain foods. Drink enough fluid to keep your urine pale yellow. Keep all follow-up visits. This is important. Contact a health care provider if: You have a fever. You have a sore throat or difficulty swallowing. You have a headache. You have sinus or ear pain. You have a cough that does not go away. The mucus from your nose becomes thick and is green or yellow in color. You have cold or flu symptoms that last more than 10 days. Summary Postnasal drip is the feeling of mucus going down the back of your throat. Use nasal irrigation or a nasal spray to help clear away mucus and keep the nasal passages  moist. Avoid things that you know you are allergic to (allergens), like dust, mold, pollen, pets, or certain foods. This information is not intended to replace advice given to you by your health care provider. Make sure you discuss any questions you have with your health care provider. Document Revised: 12/26/2020 Document Reviewed: 12/26/2020 Elsevier Patient Education  [employer].    If you have been instructed to have an in-person evaluation today at a local Urgent Care facility, please use the link below. It will take you to a list of all of our available [facility], including address, phone number and hours of operation. Please do not delay care.  [facility]  Urgent Cares  If you or a family member do not have a primary care provider, use the link below to schedule a visit and establish care. When you choose a [facility] primary care physician or advanced practice provider, you gain a long-term partner in health. Find a Primary Care Provider  Learn more about [facility]'s in-office and virtual care options: [facility] - Get Care Now

## 2022-05-11 ENCOUNTER — Other Ambulatory Visit: Payer: Self-pay | Admitting: Family Medicine

## 2022-05-11 ENCOUNTER — Ambulatory Visit: Payer: Self-pay | Admitting: *Deleted

## 2022-05-11 ENCOUNTER — Telehealth: Payer: Self-pay | Admitting: Family Medicine

## 2022-05-11 LAB — LIPID PANEL
Cholesterol: 131 mg/dL (ref <200)
HDL: 40 mg/dL (ref >=40)
LDL Cholesterol (Calc): 68 mg/dL (calc)
Non-HDL Cholesterol (Calc): 91 mg/dL (calc) (ref <130)
Total CHOL/HDL Ratio: 3.3 (calc) (ref <5.0)
Triglycerides: 152 mg/dL — ABNORMAL HIGH (ref <150)

## 2022-05-11 LAB — CBC WITH DIFFERENTIAL/PLATELET
Absolute Monocytes: 555 cells/uL (ref 200–950)
Basophils Absolute: 43 cells/uL (ref 0–200)
Basophils Relative: 0.7 %
Eosinophils Absolute: 281 cells/uL (ref 15–500)
Eosinophils Relative: 4.6 %
HCT: 42.9 % (ref 38.5–50.0)
Hemoglobin: 14.1 g/dL (ref 13.2–17.1)
Lymphs Abs: 1933.7 cells/uL (ref 850–3900)
MCH: 31.3 pg (ref 27.0–33.0)
MCHC: 32.9 g/dL (ref 32.0–36.0)
MCV: 95.3 fL (ref 80.0–100.0)
MPV: 10.7 fL (ref 7.5–12.5)
Monocytes Relative: 9.1 %
Neutro Abs: 3288 cells/uL (ref 1500–7800)
Neutrophils Relative %: 53.9 %
Platelets: 241 10*3/uL (ref 140–400)
RBC: 4.5 10*6/uL (ref 4.20–5.80)
RDW: 13.1 % (ref 11.0–15.0)
Total Lymphocyte: 31.7 %
WBC: 6.1 10*3/uL (ref 3.8–10.8)

## 2022-05-11 LAB — PAT ID TIQ DOC: Test Affected: 6399

## 2022-05-11 LAB — COMPLETE METABOLIC PANEL WITH GFR
AG Ratio: 1.6 (calc) (ref 1.0–2.5)
ALT: 20 U/L (ref 9–46)
AST: 18 U/L (ref 10–35)
Albumin: 4.2 g/dL (ref 3.6–5.1)
Alkaline phosphatase (APISO): 62 U/L (ref 35–144)
BUN/Creatinine Ratio: 23 (calc) — ABNORMAL HIGH (ref 6–22)
BUN: 26 mg/dL — ABNORMAL HIGH (ref 7–25)
CO2: 28 mmol/L (ref 20–32)
Calcium: 9.4 mg/dL (ref 8.6–10.3)
Chloride: 102 mmol/L (ref 98–110)
Creat: 1.11 mg/dL (ref 0.70–1.28)
Globulin: 2.6 g/dL (calc) (ref 1.9–3.7)
Glucose, Bld: 94 mg/dL (ref 65–99)
Potassium: 4.5 mmol/L (ref 3.5–5.3)
Sodium: 140 mmol/L (ref 135–146)
Total Bilirubin: 0.4 mg/dL (ref 0.2–1.2)
Total Protein: 6.8 g/dL (ref 6.1–8.1)
eGFR: 70 mL/min/{1.73_m2} (ref >=60)

## 2022-05-11 LAB — TSH: TSH: 1.2 mIU/L (ref 0.40–4.50)

## 2022-05-11 LAB — TESTOSTERONE TOTAL,FREE,BIO, MALES
Albumin: 4.2 g/dL (ref 3.6–5.1)
Sex Hormone Binding: 57 nmol/L (ref 22–77)
Testosterone, Bioavailable: 95 ng/dL (ref 15.0–150.0)
Testosterone, Free: 49.3 pg/mL (ref 6.0–73.0)
Testosterone: 575 ng/dL (ref 250–827)

## 2022-05-11 LAB — PSA: PSA: 0.34 ng/mL (ref <=4.00)

## 2022-05-11 MED ORDER — HYDROCODONE BIT-HOMATROP MBR 5-1.5 MG/5ML PO SOLN: 5.0000 mL | Freq: Three times a day (TID) | ORAL | 0 refills | Status: DC | PRN

## 2022-05-11 NOTE — Telephone Encounter (Signed)
Patient called to follow up on recent visit; stated provider offered to call in script for HYDROcodone bit-homatropine (HYCODAN) 5-1.5 MG/5ML syrup [id number]  DISCONTINUED cough med. Pharmacy hasn't received it.  Medication sent in by triage nurse hasn't worked for him.  Pharmacy confirmed as   [id number] [address], [place] [id number], [place] [address] Phone: [phone]  Fax: [phone] DEA #: [id number]   Please advise patient when script called in at [phone].

## 2022-05-11 NOTE — Telephone Encounter (Signed)
  Chief Complaint: Shingles shot given last Thur.   It's still very red, swollen, sore and very itchy.  Antihistamines cream and pills not helping Symptoms: Left arm red, swollen, sore and itching Frequency: Since last Thur.   May be a little better today but not much. Pertinent Negatives: Patient denies Having reactions to vaccines before Disposition: [] ED /[] Urgent Care (no appt availability in office) / [x] Appointment(In office/virtual)/ []  [facility]/ [] Home Care/ [] Refused Recommended Disposition /[]  Mobile Bus/ []  Follow-up with PCP Additional Notes: Appt. Made with Mila Merry, [place] for 05/12/2022 at 8:00 since Dr. Dennard Schaumann did not have any openings today.

## 2022-05-11 NOTE — Telephone Encounter (Signed)
Reason for Disposition  [1] Over 3 days (72 hours) since shot AND [2] redness, swelling or pain getting worse  Answer Assessment - Initial Assessment Questions 1. SYMPTOMS: "What is the main symptom?" (e.g., redness, swelling, pain)      Shingles shot is 1 inch by 2 inches.    My site is sore and itching.   I felt bad for a few days after the shot. 2. ONSET: "When was the vaccine (shot) given?" "How much later did the It's  begin?" (e.g., hours, days ago)      Given Thursday morning in left arm.    The itching is bad.   It developed. 3. SEVERITY: "How bad is it?"      It's red and sore and I feel bad.   Is there something I can use for the itching and soreness.    I used hydrocortisone cream and Benadryl cream did not help either.   4. FEVER: "Is there a fever?" If Yes, ask: "What is it, how was it measured, and when did it start?"      I had body aches.    No fever now. 5. IMMUNIZATIONS GIVEN: "What shots have you recently received?"     The Shingles vaccine on Thursday morning.    The site is getting better.   6. PAST REACTIONS: "Have you reacted to immunizations before?" If Yes, ask: "What happened?"     No 7. OTHER SYMPTOMS: "Do you have any other symptoms?"     No  Protocols used: Immunization Reactions-A-AH

## 2022-05-12 ENCOUNTER — Ambulatory Visit (INDEPENDENT_AMBULATORY_CARE_PROVIDER_SITE_OTHER): Payer: Medicare HMO | Admitting: Family Medicine

## 2022-05-12 ENCOUNTER — Encounter: Payer: Self-pay | Admitting: Family Medicine

## 2022-05-12 VITALS — BP 138/72 | HR 67 | Temp 98.5°F | Resp 12 | Ht 68.0 in | Wt 209.6 lb

## 2022-05-12 DIAGNOSIS — T8090XA Unspecified complication following infusion and therapeutic injection, initial encounter: Secondary | ICD-10-CM

## 2022-05-12 NOTE — Progress Notes (Signed)
   Acute Office Visit  Subjective:     Patient ID: Richard Arnold., male    DOB: [birth year], 72 y.o.   MRN: [id number]  No chief complaint on file.   HPI Patient is in today for injection site reaction (redness and swelling) after Shingles vaccine last Thursday associated with fever to 101 and malaise Friday and Saturday. He also has had a cough and congestion that started just prior to the vaccine.  Denies nausea, vomiting, swelling, rash. Has tried Tylenol.  Review of Systems  Constitutional:  Positive for fever and malaise/fatigue.  HENT:  Positive for congestion.   Respiratory:  Positive for cough.   Skin:  Positive for itching. Negative for rash.  All other systems reviewed and are negative.       Objective:    BP 138/72 (BP Location: Right Arm, Patient Position: Sitting, Cuff Size: Large)   Pulse 67   Temp 98.5 F (36.9 C) (Oral)   Resp 12   Ht 5\' 8"  (1.727 m)   Wt 209 lb 9.6 oz (95.1 kg)   SpO2 96%   BMI 31.87 kg/m    Physical Exam Vitals and nursing note reviewed.  Constitutional:      Appearance: Normal appearance. He is normal weight.  HENT:     Head: Normocephalic and atraumatic.  Skin:    General: Skin is warm and dry.     Capillary Refill: Capillary refill takes less than 2 seconds.     Findings: Bruising and erythema present.       Neurological:     General: No focal deficit present.     Mental Status: He is alert and oriented to person, place, and time. Mental status is at baseline.  Psychiatric:        Mood and Affect: Mood normal.        Behavior: Behavior normal.        Thought Content: Thought content normal.        Judgment: Judgment normal.     No results found for any visits on 05/12/22.      Assessment & Plan:   Problem List Items Addressed This Visit       Other   Injection site reaction - Primary    Patient has localized bruising, erythema, and edema at site of Zoster injection. Systemic symptoms include fever and  malaise but he is also battling a viral URI. Recommended Tylenol or Ibuprofen and cold compress to injection site. Return to office if symptoms persist or worsen or for signs of infection to include worsening swelling, redness, warmth, or purulent drainage.       No orders of the defined types were placed in this encounter.   Return if symptoms worsen or fail to improve.  Rubie Maid, FNP

## 2022-05-12 NOTE — Assessment & Plan Note (Signed)
Patient has localized bruising, erythema, and edema at site of Zoster injection. Systemic symptoms include fever and malaise but he is also battling a viral URI. Recommended Tylenol or Ibuprofen and cold compress to injection site. Return to office if symptoms persist or worsen or for signs of infection to include worsening swelling, redness, warmth, or purulent drainage.

## 2022-07-09 DIAGNOSIS — S63501A Unspecified sprain of right wrist, initial encounter: Secondary | ICD-10-CM | POA: Diagnosis not present

## 2022-07-15 NOTE — Progress Notes (Unsigned)
Cardiology Office Note:    Date:  07/16/2022   ID:  Richard Spark., DOB 1950-09-02, MRN [id number]  PCP:  Donita Brooks, MD   [facility] Providers Cardiologist:  Christell Constant, MD     Referring MD: Donita Brooks, MD   CC: CAD f/u  History of Present Illness:    Richard Leavelle. is a 72 y.o. male with a hx of HLD who presents for evaluation 08/14/20.  In 2022 had cath with OM2 disease that was too small for intervention. Statin myalgias.  Caled 06/18/21 with SOB with lasix non adherence. 2023:: back in the garden and doing E biking with rare chest pressure.  Patient notes that he is doing ok.   He had an issues with a fall (he was on a ladder and the ladder slipped).  There are no interval hospital/ED visit.    No chest pain or pressure .  No SOB/DOE and no PND/Orthopnea.  No weight gain or leg swelling.  No palpitations or syncope.  Notes more fatigue. Notes that he has a very brisk response to lasix.   Past Medical History:  Diagnosis Date   Arthritis    Brachial neuritis    Gastric ulcer    GI bleed    H pylori ulcer    Hypercholesteremia    Turner syndrome    left arm, some numbness in fingers    Past Surgical History:  Procedure Laterality Date   COLONOSCOPY WITH PROPOFOL N/A 03/05/2015   Procedure: COLONOSCOPY WITH PROPOFOL;  Surgeon: Charolett Bumpers, MD;  Location: [facility] ENDOSCOPY;  Service: Endoscopy;  Laterality: N/A;   ESOPHAGOGASTRODUODENOSCOPY  09/08/2011   Procedure: ESOPHAGOGASTRODUODENOSCOPY (EGD);  Surgeon: Vertell Novak., MD;  Location: [facility];  Service: Endoscopy;  Laterality: N/A;   HERNIA REPAIR     INGUINAL HERNIA REPAIR Right 08/30/2013   Procedure: HERNIA REPAIR INGUINAL WITH MESH  ADULT;  Surgeon: Valarie Merino, MD;  Location: [facility] ORS;  Service: General;  Laterality: Right;   LEFT HEART CATH AND CORONARY ANGIOGRAPHY N/A 08/19/2020   Procedure: LEFT HEART CATH AND CORONARY ANGIOGRAPHY;  Surgeon: Swaziland, Peter M, MD;   Location: [facility] INVASIVE CV LAB;  Service: Cardiovascular;  Laterality: N/A;   SHOULDER ARTHROSCOPY  2011   x4  one open rt, rotator cuff tear, Dr. Amanda Pea   TOTAL SHOULDER ARTHROPLASTY  01/30/2011   Procedure: TOTAL SHOULDER ARTHROPLASTY;  Surgeon: Verlee Rossetti;  Location: [facility] OR;  Service: Orthopedics;  Laterality: Right;  right total shoulder atrhoplasty    Current Medications: Current Meds  Medication Sig   acetaminophen (TYLENOL) 650 MG CR tablet Take 650 mg by mouth every 8 (eight) hours as needed for pain.   aspirin EC 81 MG tablet Take 81 mg by mouth daily. Swallow whole.   atorvastatin (LIPITOR) 20 MG tablet Take 1 tablet (20 mg total) by mouth daily at 6 (six) AM.   diphenhydrAMINE (BENADRYL) 25 MG tablet Take 25 mg by mouth 2 (two) times daily.   ezetimibe (ZETIA) 10 MG tablet TAKE 1 TABLET BY MOUTH EVERY DAY   fish oil-omega-3 fatty acids 1000 MG capsule Take 2 g by mouth daily.   furosemide (LASIX) 20 MG tablet Take 1 tablet (20 mg total) by mouth daily as needed.   HYDROcodone bit-homatropine (HYCODAN) 5-1.5 MG/5ML syrup Take 5 mLs by mouth every 8 (eight) hours as needed for cough.   Multiple Vitamin (MULTIVITAMIN WITH MINERALS) TABS tablet Take 1 tablet by  mouth daily.   nitroGLYCERIN (NITROSTAT) 0.4 MG SL tablet Place 1 tablet (0.4 mg total) under the tongue every 5 (five) minutes as needed for chest pain. You can take up to 3 tablets   pantoprazole (PROTONIX) 40 MG tablet TAKE 1 TABLET BY MOUTH TWICE A DAY BEFORE A MEAL   primidone (MYSOLINE) 50 MG tablet TAKE 2 TABLETS BY MOUTH AT BEDTIME   tamsulosin (FLOMAX) 0.4 MG CAPS capsule TAKE 1 CAPSULE BY MOUTH EVERY DAY   TURMERIC PO Take 1 capsule by mouth daily.   valACYclovir (VALTREX) 1000 MG tablet Take 2 tablets (2,000 mg total) by mouth 2 (two) times daily. (Patient taking differently: Take 2,000 mg by mouth as needed (cold sores).)   vitamin C (ASCORBIC ACID) 500 MG tablet Take 500 mg by mouth daily.   Zinc 50 MG TABS Take  50 mg by mouth daily.   [DISCONTINUED] benzonatate (TESSALON) 100 MG capsule Take 1 capsule (100 mg total) by mouth 3 (three) times daily as needed.   [DISCONTINUED] furosemide (LASIX) 20 MG tablet TAKE 1 TABLET BY MOUTH EVERY DAY   [DISCONTINUED] metoprolol tartrate (LOPRESSOR) 25 MG tablet TAKE 0.5 TABLETS BY MOUTH 2 TIMES DAILY.   [DISCONTINUED] promethazine-dextromethorphan (PROMETHAZINE-DM) 6.25-15 MG/5ML syrup Take 5 mLs by mouth 4 (four) times daily as needed.     Allergies:   Bismuth subsalicylate, Monosodium glutamate, Latex, and Zoster vac recomb adjuvanted   Social History   Socioeconomic History   Marital status: Married    Spouse name: Not on file   Number of children: Not on file   Years of education: Not on file   Highest education level: Associate degree: occupational, Scientist, product/process development, or vocational program  Occupational History   Not on file  Tobacco Use   Smoking status: Never   Smokeless tobacco: Never  Vaping Use   Vaping Use: Never used  Substance and Sexual Activity   Alcohol use: No   Drug use: No   Sexual activity: Not on file  Other Topics Concern   Not on file  Social History Narrative   Not on file   Social Determinants of Health   Financial Resource Strain: Low Risk  (05/11/2022)   Overall Financial Resource Strain (CARDIA)    Difficulty of Paying Living Expenses: Not hard at all  Food Insecurity: No Food Insecurity (05/11/2022)   Hunger Vital Sign    Worried About Running Out of Food in the Last Year: Never true    Ran Out of Food in the Last Year: Never true  Transportation Needs: No Transportation Needs (05/11/2022)   PRAPARE - Administrator, Civil Service (Medical): No    Lack of Transportation (Non-Medical): No  Physical Activity: Insufficiently Active (05/11/2022)   Exercise Vital Sign    Days of Exercise per Week: 2 days    Minutes of Exercise per Session: 50 min  Stress: No Stress Concern Present (05/11/2022)   [employer] of  Occupational Health - Occupational Stress Questionnaire    Feeling of Stress : Only a little  Social Connections: Socially Integrated (05/11/2022)   Social Connection and Isolation Panel [NHANES]    Frequency of Communication with Friends and Family: More than three times a week    Frequency of Social Gatherings with Friends and Family: More than three times a week    Attends Religious Services: More than 4 times per year    Active Member of [employer] or Organizations: Yes    Attends Banker Meetings: More  than 4 times per year    Marital Status: Married    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 History of coronary artery disease notable for father with multiple bypass surgeries, mother, uncle, and grandfather.  ROS:   Please see the history of present illness.    All other systems reviewed and are negative.  EKGs/Labs/Other Studies Reviewed:    The following studies were reviewed today:  EKG:   07/16/2022: SR rate 79  08/14/20: SR rate 76 WNL  Cardiac Studies & Procedures   CARDIAC CATHETERIZATION  CARDIAC CATHETERIZATION 08/19/2020  Narrative  Prox RCA to Mid RCA lesion is 60% stenosed.  Ost RCA lesion is 50% stenosed.  Dist LM to Ost LAD lesion is 25% stenosed.  2nd Mrg lesion is 80% stenosed.  LPAV lesion is 40% stenosed with 40% stenosed side branch in LPDA.  LV end diastolic pressure is mildly elevated.  1. Nonobstructive CAD except for a tiny second OM 2. Mildly elevated LVEDP  Plan: medical management.  Findings Coronary Findings Diagnostic  Dominance: Left  Left Main Dist LM to Ost LAD lesion is 25% stenosed. The lesion is severely calcified.  Left Circumflex The vessel is moderately ectatic.  Second Obtuse Marginal Branch Vessel is small in size. 2nd Mrg lesion is 80% stenosed.  Left Posterior Atrioventricular  Artery LPAV lesion is 40% stenosed with 40% stenosed side branch in LPDA.  Right Coronary Artery Vessel is small. The vessel is mildly ectatic. Ost RCA lesion is 50% stenosed. Prox RCA to Mid RCA lesion is 60% stenosed.  Intervention  No interventions have been documented.     ECHOCARDIOGRAM  ECHOCARDIOGRAM COMPLETE 06/12/2020  Narrative ECHOCARDIOGRAM REPORT    Patient Name:   Richard Arnold. Date of Exam: 06/12/2020 Medical Rec #:  [id number]         Height:       67.0 in Accession #:    [id number]        Weight:       200.0 lb Date of Birth:  [birth year]         BSA:          2.022 m Patient Age:    70 years          BP:           120/64 mmHg Patient Gender: M                 HR:           72 bpm. Exam Location:  [address]  Procedure: 2D Echo, Strain Analysis, Intracardiac Opacification Agent and Color Doppler  Indications:    R06.00 Dyspnea  History:        Patient has no prior history of Echocardiogram examinations. Signs/Symptoms:Dyspnea; Risk Factors:Dyslipidemia. [place]-Turner sydrome.  Sonographer:    Chanetta Marshall BA, RDCS Referring Phys: [id number] Priscille Heidelberg [facility]   Sonographer Comments: Technically difficult study due to poor echo windows. Lung attenutation.  Comparison(s): No prior Echocardiogram. Summary  1. Left ventricular ejection fraction, by estimation, is 50 to 55%. The left ventricle has low normal function. The left ventricle has no regional wall motion abnormalities. Definity contrast agent was given IV to delineate the left ventricular endocardial borders. The left ventricular internal cavity size was normal in size. There is no left ventricular hypertrophy. Left ventricular diastolic parameters are consistent with Grade I diastolic dysfunction (impaired  relaxation). 2. The right ventricular size is normal. No increase in right ventricular wall thickness. Right ventricular systolic function is normal. There is normal pulmonary artery systolic  pressure. The tricuspid regurgitant velocity is 2.29 m/s, and with an assumed right atrial pressure of 8 mmHg, the estimated right ventricular systolic pressure is 29.0 mmHg. 3. The mitral valve is abnormal. Mild to moderate mitral annular calcification. Trivial mitral valve regurgitation. No evidence of mitral valve stenosis. 4. The aortic valve was not well visualized. Aortic valve regurgitation is not visualized. No aortic stenosis is present. 5. Prominent Chiari network seen in the subcostal.  FINDINGS Left Ventricle: Left ventricular ejection fraction, by estimation, is 50 to 55%. The left ventricle has low normal function. The left ventricle has no regional wall motion abnormalities. Definity contrast agent was given IV to delineate the left ventricular endocardial borders. The left ventricular internal cavity size was normal in size. There is no left ventricular hypertrophy. Left ventricular diastolic parameters are consistent with Grade I diastolic dysfunction (impaired relaxation).  Right Ventricle: The right ventricular size is normal. No increase in right ventricular wall thickness. Right ventricular systolic function is normal. There is normal pulmonary artery systolic pressure. The tricuspid regurgitant velocity is 2.29 m/s, and with an assumed right atrial pressure of 8 mmHg, the estimated right ventricular systolic pressure is 29.0 mmHg.  Left Atrium: Left atrial size was normal in size.  Right Atrium: Right atrial size was normal in size. Prominent Chiari network.  Pericardium: There is no evidence of pericardial effusion.  Mitral Valve: The mitral valve is abnormal. Mild to moderate mitral annular calcification. Trivial mitral valve regurgitation. No evidence of mitral valve stenosis.  Tricuspid Valve: The tricuspid valve is grossly normal. Tricuspid valve regurgitation is trivial. No evidence of tricuspid stenosis.  Aortic Valve: The aortic valve was not well visualized.  Aortic valve regurgitation is not visualized. No aortic stenosis is present.  Pulmonic Valve: The pulmonic valve was not well visualized. Pulmonic valve regurgitation is not visualized. No evidence of pulmonic stenosis.  Aorta: The aortic root was not well visualized and the ascending aorta was not well visualized.  Venous: The inferior vena cava is dilated in size with greater than 50% respiratory variability, suggesting right atrial pressure of 8 mmHg.  IAS/Shunts: The atrial septum is grossly normal.   LEFT VENTRICLE PLAX 2D LVOT diam:     2.00 cm  Diastology LV SV:         73       LV e' medial:    5.87 cm/s LV SV Index:   36       LV E/e' medial:  12.1 LVOT Area:     3.14 cm LV e' lateral:   5.87 cm/s LV E/e' lateral: 12.1   RIGHT VENTRICLE            IVC RV Basal diam:  2.90 cm    IVC diam: 2.50 cm RVSP:           29.0 mmHg  LEFT ATRIUM             Index       RIGHT ATRIUM           Index LA Vol (A2C):   51.5 ml 25.47 ml/m RA Pressure: 8.00 mmHg LA Vol (A4C):   68.0 ml 33.63 ml/m RA Area:     11.00 cm LA Biplane Vol: 59.7 ml 29.52 ml/m RA Volume:   20.60 ml  10.19 ml/m AORTIC VALVE LVOT  Vmax:   103.00 cm/s LVOT Vmean:  72.800 cm/s LVOT VTI:    0.233 m  MITRAL VALVE                TRICUSPID VALVE MV Area (PHT): 4.36 cm     TR Peak grad:   21.0 mmHg MV Decel Time: 174 msec     TR Vmax:        229.00 cm/s MV E velocity: 71.10 cm/s   Estimated RAP:  8.00 mmHg MV A velocity: 104.00 cm/s  RVSP:           29.0 mmHg MV E/A ratio:  0.68 SHUNTS Systemic VTI:  0.23 m Systemic Diam: 2.00 cm  Richard Lam MD Electronically signed by Richard Lam MD Signature Date/Time: 06/12/2020/8:36:18 AM    Final               Recent Labs: 05/07/2022: ALT 20; BUN 26; Creat 1.11; Hemoglobin 14.1; Platelets 241; Potassium 4.5; Sodium 140; TSH 1.20  Recent Lipid Panel    Component Value Date/Time   CHOL 131 05/07/2022 0845   CHOL 132 04/11/2021 1001   TRIG  152 (H) 05/07/2022 0845   HDL 40 05/07/2022 0845   HDL 33 (L) 04/11/2021 1001   CHOLHDL 3.3 05/07/2022 0845   LDLCALC 68 05/07/2022 0845        Physical Exam:    VS:  BP 122/68   Pulse 79   Ht 5\' 8"  (1.727 m)   Wt 204 lb 9.6 oz (92.8 kg)   SpO2 93%   BMI 31.11 kg/m     Wt Readings from Last 3 Encounters:  07/16/22 204 lb 9.6 oz (92.8 kg)  05/12/22 209 lb 9.6 oz (95.1 kg)  05/07/22 204 lb 3.2 oz (92.6 kg)    Gen: No distress   Neck: No JVD Ears: R sided Frank's Sign Cardiac: No Rubs or Gallops, no murmur, RRR +2 radial pulses Respiratory: Clear to auscultation bilaterally, normal effort, normal  respiratory rate GI: Soft, nontender, non-distended  MS: trace bilateral edema;  moves all extremities Integument: Skin feels warm Neuro:  At time of evaluation, alert and oriented to person/place/time/situation  Psych: Normal affect, patient feels well   ASSESSMENT:    1. Coronary artery disease of native artery of native heart with stable angina pectoris (HCC)   2. Essential tremor   3. Bilateral lower extremity edema   4. Aortic atherosclerosis (HCC)   5. Hypercholesteremia   6. Myalgia due to statin     PLAN:    Coronary Artery Disease; Obstructive HLD and Aortic atherosclerosis  Statin myalgias unable to tolerate statins - asymptomatic with new fatigue and essential tremor - anatomy: OM2 vessel un amenable to medical management - continue ASA 81 mg - continue statin (highest tolerated) and zetia , goal LDL < 55; discussed exercise using health weight and wellness - will do two week trial of tartrate and look for worsening of tremor and chest pain; if occurs restart 12.5 mg PO BID - continue nitrates; none needed  Diet Prescription Type: Mediterranean  Calorie goal: 1800 Weight loss goal if applicable: ~ 1 lbs a weeks Limitations: doesn't like avocados Meal plan: Created one day meal plan and gave online resources for weekly planning; patient and wife are  amenable to diet created   LE edema MAC soft systolic murmur no MR on echo - echo in 2024 - switching to PRN diuretics with BMP and BNP in two weeks  One year me or APP  Medication Adjustments/Labs and Tests Ordered: Current medicines are reviewed at length with the patient today.  Concerns regarding medicines are outlined above.  Orders Placed This Encounter  Procedures   Basic metabolic panel   Pro b natriuretic peptide (BNP)   EKG 12-Lead      Meds ordered this encounter  Medications   furosemide (LASIX) 20 MG tablet    Sig: Take 1 tablet (20 mg total) by mouth daily as needed.    Dispense:  30 tablet    Refill:  11       Patient Instructions  Medication Instructions:  Your physician has recommended you make the following change in your medication:  STOP: metoprolol TAKE: furosemide (Lasix) 20 mg by mouth once daily as needed for shortness of breath/ swelling  *If you need a refill on your cardiac medications before your next appointment, please call your pharmacy*   Lab Work: IN 2 WEEKS: BMP, BNP  If you have labs (blood work) drawn today and your tests are completely normal, you will receive your results only by: MyChart Message (if you have MyChart) OR A paper copy in the mail If you have any lab test that is abnormal or we need to change your treatment, we will call you to review the results.   Testing/Procedures: NONE   Follow-Up: At [facility], you and your health needs are our priority.  As part of our continuing mission to provide you with exceptional heart care, we have created designated Provider Care Teams.  These Care Teams include your primary Cardiologist (physician) and Advanced Practice Providers (APPs -  Physician Assistants and Nurse Practitioners) who all work together to provide you with the care you need, when you need it.      Your next appointment:   1 year(s)  Provider:   Christell Constant, MD        Signed, Christell Constant, MD  07/16/2022 12:30 PM    [facility]

## 2022-07-16 ENCOUNTER — Ambulatory Visit: Payer: Medicare HMO | Attending: Internal Medicine | Admitting: Internal Medicine

## 2022-07-16 ENCOUNTER — Encounter: Payer: Self-pay | Admitting: Internal Medicine

## 2022-07-16 VITALS — BP 122/68 | HR 79 | Ht 68.0 in | Wt 204.6 lb

## 2022-07-16 DIAGNOSIS — I700 Atherosclerosis of aorta: Secondary | ICD-10-CM | POA: Diagnosis not present

## 2022-07-16 DIAGNOSIS — M791 Myalgia, unspecified site: Secondary | ICD-10-CM | POA: Diagnosis not present

## 2022-07-16 DIAGNOSIS — T466X5D Adverse effect of antihyperlipidemic and antiarteriosclerotic drugs, subsequent encounter: Secondary | ICD-10-CM | POA: Diagnosis not present

## 2022-07-16 DIAGNOSIS — I25118 Atherosclerotic heart disease of native coronary artery with other forms of angina pectoris: Secondary | ICD-10-CM | POA: Diagnosis not present

## 2022-07-16 DIAGNOSIS — T466X5A Adverse effect of antihyperlipidemic and antiarteriosclerotic drugs, initial encounter: Secondary | ICD-10-CM

## 2022-07-16 DIAGNOSIS — E7800 Pure hypercholesterolemia, unspecified: Secondary | ICD-10-CM

## 2022-07-16 DIAGNOSIS — R600 Localized edema: Secondary | ICD-10-CM | POA: Diagnosis not present

## 2022-07-16 DIAGNOSIS — G250 Essential tremor: Secondary | ICD-10-CM

## 2022-07-16 MED ORDER — FUROSEMIDE 20 MG PO TABS: 20.0000 mg | ORAL_TABLET | Freq: Every day | ORAL | 11 refills | Status: DC | PRN

## 2022-07-16 NOTE — Patient Instructions (Addendum)
Medication Instructions:  Your physician has recommended you make the following change in your medication:  STOP: metoprolol TAKE: furosemide (Lasix) 20 mg by mouth once daily as needed for shortness of breath/ swelling  *If you need a refill on your cardiac medications before your next appointment, please call your pharmacy*   Lab Work: IN 2 WEEKS: BMP, BNP  If you have labs (blood work) drawn today and your tests are completely normal, you will receive your results only by: MyChart Message (if you have MyChart) OR A paper copy in the mail If you have any lab test that is abnormal or we need to change your treatment, we will call you to review the results.   Testing/Procedures: NONE   Follow-Up: At [facility], you and your health needs are our priority.  As part of our continuing mission to provide you with exceptional heart care, we have created designated Provider Care Teams.  These Care Teams include your primary Cardiologist (physician) and Advanced Practice Providers (APPs -  Physician Assistants and Nurse Practitioners) who all work together to provide you with the care you need, when you need it.      Your next appointment:   1 year(s)  Provider:   Christell Constant, MD

## 2022-07-18 ENCOUNTER — Other Ambulatory Visit: Payer: Self-pay | Admitting: Family Medicine

## 2022-07-20 NOTE — Telephone Encounter (Signed)
Last OV 04/27/22, within protocol.  Requested Prescriptions  Pending Prescriptions Disp Refills   tamsulosin (FLOMAX) 0.4 MG CAPS capsule [Pharmacy Med Name: TAMSULOSIN HCL 0.4 MG CAPSULE] 90 capsule 0    Sig: TAKE 1 CAPSULE BY MOUTH EVERY DAY     Urology: Alpha-Adrenergic Blocker Failed - 07/18/2022 12:15 AM      Failed - Valid encounter within last 12 months    Recent Outpatient Visits           1 year ago Prostate cancer screening   [facility] Donita Brooks, MD   2 years ago DOE (dyspnea on exertion)   [facility] Pickard, Priscille Heidelberg, MD   2 years ago LLQ abdominal pain   [facility] Tanya Nones, Priscille Heidelberg, MD   3 years ago Pure hypercholesterolemia   [facility] Pickard, Priscille Heidelberg, MD   3 years ago Low back strain, initial encounter   [facility] Pickard, Priscille Heidelberg, MD              Passed - PSA in normal range and within 360 days    PSA  Date Value Ref Range Status  05/07/2022 0.34 < OR = 4.00 ng/mL Final    Comment:    The total PSA value from this assay system is  standardized against the WHO standard. The test  result will be approximately 20% lower when compared  to the equimolar-standardized total PSA (Beckman  Coulter). Comparison of serial PSA results should be  interpreted with this fact in mind. . This test was performed using the Siemens  chemiluminescent method. Values obtained from  different assay methods cannot be used interchangeably. PSA levels, regardless of value, should not be interpreted as absolute evidence of the presence or absence of disease.          Passed - Last BP in normal range    BP Readings from Last 1 Encounters:  07/16/22 122/68          primidone (MYSOLINE) 50 MG tablet [Pharmacy Med Name: PRIMIDONE 50 MG TABLET] 180 tablet 0    Sig: TAKE 2 TABLETS BY MOUTH AT BEDTIME     Neurology:  Anticonvulsants - primidone Failed - 07/18/2022 12:15 AM       Failed - Primidone (Serum) in normal range and within 360 days    No results found for: "PRIMIDONE"       Failed - Valid encounter within last 12 months    Recent Outpatient Visits           1 year ago Prostate cancer screening   [facility] Donita Brooks, MD   2 years ago DOE (dyspnea on exertion)   [facility] Pickard, Priscille Heidelberg, MD   2 years ago LLQ abdominal pain   [facility] Tanya Nones, Priscille Heidelberg, MD   3 years ago Pure hypercholesterolemia   [facility] Pickard, Priscille Heidelberg, MD   3 years ago Low back strain, initial encounter   [facility] Pickard, Priscille Heidelberg, MD              Passed - HCT in normal range and within 360 days    HCT  Date Value Ref Range Status  05/07/2022 42.9 38.5 - 50.0 % Final   Hematocrit  Date Value Ref Range Status  08/14/2020 42.4 37.5 - 51.0 % Final  Passed - HGB in normal range and within 360 days    Hemoglobin  Date Value Ref Range Status  05/07/2022 14.1 13.2 - 17.1 g/dL Final  78/46/9629 52.8 13.0 - 17.7 g/dL Final         Passed - PLT in normal range and within 360 days    Platelets  Date Value Ref Range Status  05/07/2022 241 140 - 400 Thousand/uL Final  08/14/2020 235 150 - 450 x10E3/uL Final         Passed - WBC in normal range and within 360 days    WBC  Date Value Ref Range Status  05/07/2022 6.1 3.8 - 10.8 Thousand/uL Final         Passed - AST in normal range and within 360 days    AST  Date Value Ref Range Status  05/07/2022 18 10 - 35 U/L Final         Passed - ALT in normal range and within 360 days    ALT  Date Value Ref Range Status  05/07/2022 20 9 - 46 U/L Final         Passed - Cr in normal range and within 360 days    Creat  Date Value Ref Range Status  05/07/2022 1.11 0.70 - 1.28 mg/dL Final         Passed - Completed PHQ-2 or PHQ-9 in the last 360 days

## 2022-07-22 ENCOUNTER — Other Ambulatory Visit: Payer: Self-pay | Admitting: Internal Medicine

## 2022-07-31 ENCOUNTER — Ambulatory Visit: Payer: Medicare HMO | Attending: Internal Medicine

## 2022-07-31 DIAGNOSIS — R600 Localized edema: Secondary | ICD-10-CM | POA: Diagnosis not present

## 2022-07-31 DIAGNOSIS — I25118 Atherosclerotic heart disease of native coronary artery with other forms of angina pectoris: Secondary | ICD-10-CM | POA: Diagnosis not present

## 2022-07-31 DIAGNOSIS — G250 Essential tremor: Secondary | ICD-10-CM

## 2022-07-31 LAB — BASIC METABOLIC PANEL
BUN/Creatinine Ratio: 13 (ref 10–24)
Chloride: 101 mmol/L (ref 96–106)

## 2022-08-01 LAB — BASIC METABOLIC PANEL
BUN: 15 mg/dL (ref 8–27)
CO2: 24 mmol/L (ref 20–29)
Calcium: 9.3 mg/dL (ref 8.6–10.2)
Creatinine, Ser: 1.18 mg/dL (ref 0.76–1.27)
Glucose: 92 mg/dL (ref 70–99)
Potassium: 4.4 mmol/L (ref 3.5–5.2)
Sodium: 140 mmol/L (ref 134–144)
eGFR: 65 mL/min/{1.73_m2} (ref >59)

## 2022-08-01 LAB — PRO B NATRIURETIC PEPTIDE: NT-Pro BNP: 55 pg/mL (ref 0–376)

## 2022-08-05 ENCOUNTER — Other Ambulatory Visit: Payer: Self-pay | Admitting: Internal Medicine

## 2022-08-19 DIAGNOSIS — L259 Unspecified contact dermatitis, unspecified cause: Secondary | ICD-10-CM | POA: Diagnosis not present

## 2022-10-15 ENCOUNTER — Other Ambulatory Visit: Payer: Self-pay | Admitting: Family Medicine

## 2022-10-15 NOTE — Telephone Encounter (Signed)
Requested medications are due for refill today.  yes  Requested medications are on the active medications list.  yes  Last refill. 07/20/2022 #180 0 rf  Future visit scheduled.   no  Notes to clinic.  Missing labs    Requested Prescriptions  Pending Prescriptions Disp Refills   primidone (MYSOLINE) 50 MG tablet [Pharmacy Med Name: PRIMIDONE 50 MG TABLET] 180 tablet 0    Sig: TAKE 2 TABLETS BY MOUTH AT BEDTIME     Neurology:  Anticonvulsants - primidone Failed - 10/15/2022 12:16 AM      Failed - Primidone (Serum) in normal range and within 360 days    No results found for: "PRIMIDONE"       Failed - Valid encounter within last 12 months    Recent Outpatient Visits           1 year ago Prostate cancer screening   [facility] Donita Brooks, MD   2 years ago DOE (dyspnea on exertion)   [facility] Pickard, Priscille Heidelberg, MD   3 years ago LLQ abdominal pain   [facility] Tanya Nones, Priscille Heidelberg, MD   3 years ago Pure hypercholesterolemia   [facility] Tanya Nones, Priscille Heidelberg, MD   3 years ago Low back strain, initial encounter   [facility] Pickard, Priscille Heidelberg, MD              Passed - HCT in normal range and within 360 days    HCT  Date Value Ref Range Status  05/07/2022 42.9 38.5 - 50.0 % Final   Hematocrit  Date Value Ref Range Status  08/14/2020 42.4 37.5 - 51.0 % Final         Passed - HGB in normal range and within 360 days    Hemoglobin  Date Value Ref Range Status  05/07/2022 14.1 13.2 - 17.1 g/dL Final  76/28/3151 76.1 13.0 - 17.7 g/dL Final         Passed - PLT in normal range and within 360 days    Platelets  Date Value Ref Range Status  05/07/2022 241 140 - 400 Thousand/uL Final  08/14/2020 235 150 - 450 x10E3/uL Final         Passed - WBC in normal range and within 360 days    WBC  Date Value Ref Range Status  05/07/2022 6.1 3.8 - 10.8 Thousand/uL Final         Passed -  AST in normal range and within 360 days    AST  Date Value Ref Range Status  05/07/2022 18 10 - 35 U/L Final         Passed - ALT in normal range and within 360 days    ALT  Date Value Ref Range Status  05/07/2022 20 9 - 46 U/L Final         Passed - Cr in normal range and within 360 days    Creat  Date Value Ref Range Status  05/07/2022 1.11 0.70 - 1.28 mg/dL Final   Creatinine, Ser  Date Value Ref Range Status  07/31/2022 1.18 0.76 - 1.27 mg/dL Final         Passed - Completed PHQ-2 or PHQ-9 in the last 360 days      Signed Prescriptions Disp Refills   tamsulosin (FLOMAX) 0.4 MG CAPS capsule 90 capsule 1    Sig: TAKE 1 CAPSULE BY MOUTH EVERY DAY  Urology: Alpha-Adrenergic Blocker Failed - 10/15/2022 12:16 AM      Failed - Valid encounter within last 12 months    Recent Outpatient Visits           1 year ago Prostate cancer screening   [facility] Tanya Nones, Priscille Heidelberg, MD   2 years ago DOE (dyspnea on exertion)   [facility] Tanya Nones, Priscille Heidelberg, MD   3 years ago LLQ abdominal pain   [facility] Tanya Nones, Priscille Heidelberg, MD   3 years ago Pure hypercholesterolemia   [facility] Tanya Nones, Priscille Heidelberg, MD   3 years ago Low back strain, initial encounter   [facility] Pickard, Priscille Heidelberg, MD              Passed - PSA in normal range and within 360 days    PSA  Date Value Ref Range Status  05/07/2022 0.34 < OR = 4.00 ng/mL Final    Comment:    The total PSA value from this assay system is  standardized against the WHO standard. The test  result will be approximately 20% lower when compared  to the equimolar-standardized total PSA (Beckman  Coulter). Comparison of serial PSA results should be  interpreted with this fact in mind. . This test was performed using the Siemens  chemiluminescent method. Values obtained from  different assay methods cannot be used interchangeably. PSA levels,  regardless of value, should not be interpreted as absolute evidence of the presence or absence of disease.          Passed - Last BP in normal range    BP Readings from Last 1 Encounters:  07/16/22 122/68

## 2022-10-15 NOTE — Telephone Encounter (Signed)
Requested Prescriptions  Pending Prescriptions Disp Refills   tamsulosin (FLOMAX) 0.4 MG CAPS capsule [Pharmacy Med Name: TAMSULOSIN HCL 0.4 MG CAPSULE] 90 capsule 1    Sig: TAKE 1 CAPSULE BY MOUTH EVERY DAY     Urology: Alpha-Adrenergic Blocker Failed - 10/15/2022 12:16 AM      Failed - Valid encounter within last 12 months    Recent Outpatient Visits           1 year ago Prostate cancer screening   [facility] Donita Brooks, MD   2 years ago DOE (dyspnea on exertion)   [facility] Pickard, Priscille Heidelberg, MD   3 years ago LLQ abdominal pain   [facility] Tanya Nones, Priscille Heidelberg, MD   3 years ago Pure hypercholesterolemia   [facility] Pickard, Priscille Heidelberg, MD   3 years ago Low back strain, initial encounter   [facility] Pickard, Priscille Heidelberg, MD              Passed - PSA in normal range and within 360 days    PSA  Date Value Ref Range Status  05/07/2022 0.34 < OR = 4.00 ng/mL Final    Comment:    The total PSA value from this assay system is  standardized against the WHO standard. The test  result will be approximately 20% lower when compared  to the equimolar-standardized total PSA (Beckman  Coulter). Comparison of serial PSA results should be  interpreted with this fact in mind. . This test was performed using the Siemens  chemiluminescent method. Values obtained from  different assay methods cannot be used interchangeably. PSA levels, regardless of value, should not be interpreted as absolute evidence of the presence or absence of disease.          Passed - Last BP in normal range    BP Readings from Last 1 Encounters:  07/16/22 122/68          primidone (MYSOLINE) 50 MG tablet [Pharmacy Med Name: PRIMIDONE 50 MG TABLET] 180 tablet 0    Sig: TAKE 2 TABLETS BY MOUTH AT BEDTIME     Neurology:  Anticonvulsants - primidone Failed - 10/15/2022 12:16 AM      Failed - Primidone (Serum) in  normal range and within 360 days    No results found for: "PRIMIDONE"       Failed - Valid encounter within last 12 months    Recent Outpatient Visits           1 year ago Prostate cancer screening   [facility] Donita Brooks, MD   2 years ago DOE (dyspnea on exertion)   [facility] Pickard, Priscille Heidelberg, MD   3 years ago LLQ abdominal pain   [facility] Tanya Nones, Priscille Heidelberg, MD   3 years ago Pure hypercholesterolemia   [facility] Pickard, Priscille Heidelberg, MD   3 years ago Low back strain, initial encounter   [facility] Pickard, Priscille Heidelberg, MD              Passed - HCT in normal range and within 360 days    HCT  Date Value Ref Range Status  05/07/2022 42.9 38.5 - 50.0 % Final   Hematocrit  Date Value Ref Range Status  08/14/2020 42.4 37.5 - 51.0 % Final         Passed - HGB in normal  range and within 360 days    Hemoglobin  Date Value Ref Range Status  05/07/2022 14.1 13.2 - 17.1 g/dL Final  09/60/4540 98.1 13.0 - 17.7 g/dL Final         Passed - PLT in normal range and within 360 days    Platelets  Date Value Ref Range Status  05/07/2022 241 140 - 400 Thousand/uL Final  08/14/2020 235 150 - 450 x10E3/uL Final         Passed - WBC in normal range and within 360 days    WBC  Date Value Ref Range Status  05/07/2022 6.1 3.8 - 10.8 Thousand/uL Final         Passed - AST in normal range and within 360 days    AST  Date Value Ref Range Status  05/07/2022 18 10 - 35 U/L Final         Passed - ALT in normal range and within 360 days    ALT  Date Value Ref Range Status  05/07/2022 20 9 - 46 U/L Final         Passed - Cr in normal range and within 360 days    Creat  Date Value Ref Range Status  05/07/2022 1.11 0.70 - 1.28 mg/dL Final   Creatinine, Ser  Date Value Ref Range Status  07/31/2022 1.18 0.76 - 1.27 mg/dL Final         Passed - Completed PHQ-2 or PHQ-9 in the last 360  days

## 2022-11-06 ENCOUNTER — Other Ambulatory Visit: Payer: Self-pay

## 2022-11-06 DIAGNOSIS — G545 Neuralgic amyotrophy: Secondary | ICD-10-CM

## 2022-11-06 DIAGNOSIS — G250 Essential tremor: Secondary | ICD-10-CM

## 2022-11-06 MED ORDER — PRIMIDONE 50 MG PO TABS: 100.0000 mg | ORAL_TABLET | Freq: Every day | ORAL | 1 refills | Status: DC

## 2022-11-30 DIAGNOSIS — L570 Actinic keratosis: Secondary | ICD-10-CM | POA: Diagnosis not present

## 2022-11-30 DIAGNOSIS — D485 Neoplasm of uncertain behavior of skin: Secondary | ICD-10-CM | POA: Diagnosis not present

## 2022-12-30 ENCOUNTER — Telehealth: Payer: Self-pay | Admitting: Family Medicine

## 2022-12-30 ENCOUNTER — Other Ambulatory Visit: Payer: Self-pay

## 2022-12-30 DIAGNOSIS — K921 Melena: Secondary | ICD-10-CM

## 2022-12-30 MED ORDER — PANTOPRAZOLE SODIUM 40 MG PO TBEC
DELAYED_RELEASE_TABLET | ORAL | 0 refills | Status: DC
Start: 2022-12-30 — End: 2023-01-21

## 2022-12-30 NOTE — Telephone Encounter (Signed)
Prescription Request  12/30/2022  LOV: 05/07/2022  What is the name of the medication or equipment?  pantoprazole (PROTONIX) 40 MG tablet  SIG: take 1 tablet by mouth twice a day before a meal **90 day script requested**  Have you contacted your pharmacy to request a refill? Yes   Which pharmacy would you like this sent to?  [id number] - [place], [place] - [address] AT [facility] [address] [place] [address] Phone: [phone] Fax: [phone]    Patient notified that their request is being sent to the clinical staff for review and that they should receive a response within 2 business days.   Please advise pharmacist.

## 2023-01-06 DIAGNOSIS — M542 Cervicalgia: Secondary | ICD-10-CM | POA: Diagnosis not present

## 2023-01-21 ENCOUNTER — Encounter: Payer: Self-pay | Admitting: Family Medicine

## 2023-01-21 ENCOUNTER — Ambulatory Visit (INDEPENDENT_AMBULATORY_CARE_PROVIDER_SITE_OTHER): Payer: Medicare HMO | Admitting: Family Medicine

## 2023-01-21 VITALS — Ht 68.0 in | Wt 206.1 lb

## 2023-01-21 DIAGNOSIS — I2510 Atherosclerotic heart disease of native coronary artery without angina pectoris: Secondary | ICD-10-CM | POA: Diagnosis not present

## 2023-01-21 DIAGNOSIS — K921 Melena: Secondary | ICD-10-CM | POA: Diagnosis not present

## 2023-01-21 DIAGNOSIS — R5383 Other fatigue: Secondary | ICD-10-CM

## 2023-01-21 DIAGNOSIS — E7800 Pure hypercholesterolemia, unspecified: Secondary | ICD-10-CM | POA: Diagnosis not present

## 2023-01-21 DIAGNOSIS — I10 Essential (primary) hypertension: Secondary | ICD-10-CM | POA: Diagnosis not present

## 2023-01-21 DIAGNOSIS — G545 Neuralgic amyotrophy: Secondary | ICD-10-CM

## 2023-01-21 DIAGNOSIS — I700 Atherosclerosis of aorta: Secondary | ICD-10-CM

## 2023-01-21 DIAGNOSIS — Z125 Encounter for screening for malignant neoplasm of prostate: Secondary | ICD-10-CM | POA: Diagnosis not present

## 2023-01-21 MED ORDER — PANTOPRAZOLE SODIUM 40 MG PO TBEC: 40.0000 mg | DELAYED_RELEASE_TABLET | Freq: Every day | ORAL | 3 refills | Status: DC

## 2023-01-21 MED ORDER — METOPROLOL TARTRATE 25 MG PO TABS: 12.5000 mg | ORAL_TABLET | Freq: Two times a day (BID) | ORAL | 3 refills | Status: DC

## 2023-01-21 NOTE — Progress Notes (Signed)
Subjective:    Patient ID: Richard Arnold., male    DOB: 01/05/1951, 72 y.o.   MRN: [id number]  HPI Cath in 2022 revealed: Prox RCA to Mid RCA lesion is 60% stenosed. Ost RCA lesion is 50% stenosed. Dist LM to Ost LAD lesion is 25% stenosed. 2nd Mrg lesion is 80% stenosed. LPAV lesion is 40% stenosed with 40% stenosed side branch in LPDA. LV end diastolic pressure is mildly elevated. He is currently on Zetia and atorvastatin for hyperlipidemia.  He also has a severe essential tremor.  His cardiologist discontinued metoprolol this summer.  The patient states the essential tremor has worsened since he stopped the metoprolol.  He denies any syncope or lightheadedness or bradycardia.  His blood pressure today is acceptable.  He would like to resume the metoprolol as this seemed to help with his tremor.  He denies any chest pain shortness of breath dyspnea on exertion or angina. Past Medical History:  Diagnosis Date   Arthritis    Brachial neuritis    Gastric ulcer    GI bleed    H pylori ulcer    Hypercholesteremia    Turner syndrome    left arm, some numbness in fingers   Past Surgical History:  Procedure Laterality Date   COLONOSCOPY WITH PROPOFOL N/A 03/05/2015   Procedure: COLONOSCOPY WITH PROPOFOL;  Surgeon: Charolett Bumpers, MD;  Location: [facility];  Service: Endoscopy;  Laterality: N/A;   ESOPHAGOGASTRODUODENOSCOPY  09/08/2011   Procedure: ESOPHAGOGASTRODUODENOSCOPY (EGD);  Surgeon: Vertell Novak., MD;  Location: [facility];  Service: Endoscopy;  Laterality: N/A;   HERNIA REPAIR     INGUINAL HERNIA REPAIR Right 08/30/2013   Procedure: HERNIA REPAIR INGUINAL WITH MESH  ADULT;  Surgeon: Valarie Merino, MD;  Location: [facility];  Service: General;  Laterality: Right;   LEFT HEART CATH AND CORONARY ANGIOGRAPHY N/A 08/19/2020   Procedure: LEFT HEART CATH AND CORONARY ANGIOGRAPHY;  Surgeon: Swaziland, Peter M, MD;  Location: [facility];  Service: Cardiovascular;   Laterality: N/A;   SHOULDER ARTHROSCOPY  2011   x4  one open rt, rotator cuff tear, Dr. Amanda Pea   TOTAL SHOULDER ARTHROPLASTY  01/30/2011   Procedure: TOTAL SHOULDER ARTHROPLASTY;  Surgeon: Verlee Rossetti;  Location: [facility];  Service: Orthopedics;  Laterality: Right;  right total shoulder atrhoplasty   Current Outpatient Medications on File Prior to Visit  Medication Sig Dispense Refill   acetaminophen (TYLENOL) 650 MG CR tablet Take 650 mg by mouth every 8 (eight) hours as needed for pain.     aspirin EC 81 MG tablet Take 81 mg by mouth daily. Swallow whole.     atorvastatin (LIPITOR) 20 MG tablet Take 1 tablet (20 mg total) by mouth daily at 6 (six) AM. 90 tablet 3   diphenhydrAMINE (BENADRYL) 25 MG tablet Take 25 mg by mouth 2 (two) times daily.     ezetimibe (ZETIA) 10 MG tablet TAKE 1 TABLET BY MOUTH EVERY DAY 90 tablet 3   fish oil-omega-3 fatty acids 1000 MG capsule Take 2 g by mouth daily.     furosemide (LASIX) 20 MG tablet Take 1 tablet (20 mg total) by mouth daily as needed. 30 tablet 11   HYDROcodone bit-homatropine (HYCODAN) 5-1.5 MG/5ML syrup Take 5 mLs by mouth every 8 (eight) hours as needed for cough. 120 mL 0   Multiple Vitamin (MULTIVITAMIN WITH MINERALS) TABS tablet Take 1 tablet by mouth daily.     nitroGLYCERIN (NITROSTAT) 0.4  MG SL tablet PLACE 1 TABLET UNDER THE TONGUE EVERY 5 MINUTES AS NEEDED FOR CHEST PAIN YOU CAN TAKE UP TO 3 TABLET 25 tablet 11   pantoprazole (PROTONIX) 40 MG tablet TAKE 1 TABLET BY MOUTH TWICE A DAY BEFORE A MEAL 60 tablet 0   primidone (MYSOLINE) 50 MG tablet Take 2 tablets (100 mg total) by mouth at bedtime. 180 tablet 1   tamsulosin (FLOMAX) 0.4 MG CAPS capsule TAKE 1 CAPSULE BY MOUTH EVERY DAY 90 capsule 1   TURMERIC PO Take 1 capsule by mouth daily.     valACYclovir (VALTREX) 1000 MG tablet Take 2 tablets (2,000 mg total) by mouth 2 (two) times daily. (Patient taking differently: Take 2,000 mg by mouth as needed (cold sores).) 4 tablet 0    vitamin C (ASCORBIC ACID) 500 MG tablet Take 500 mg by mouth daily.     Zinc 50 MG TABS Take 50 mg by mouth daily.     No current facility-administered medications on file prior to visit.   Allergies  Allergen Reactions   Bismuth Subsalicylate     Other reaction(s): hives   Monosodium Glutamate Swelling    Rash, throat swells   Latex Rash   Zoster Vac Recomb Adjuvanted Dermatitis   Social History   Socioeconomic History   Marital status: Married    Spouse name: Not on file   Number of children: Not on file   Years of education: Not on file   Highest education level: Some college, no degree  Occupational History   Not on file  Tobacco Use   Smoking status: Never   Smokeless tobacco: Never  Vaping Use   Vaping status: Never Used  Substance and Sexual Activity   Alcohol use: No   Drug use: No   Sexual activity: Not on file  Other Topics Concern   Not on file  Social History Narrative   Not on file   Social Drivers of Health   Financial Resource Strain: Low Risk  (01/15/2023)   Overall Financial Resource Strain (CARDIA)    Difficulty of Paying Living Expenses: Not hard at all  Food Insecurity: No Food Insecurity (01/15/2023)   Hunger Vital Sign    Worried About Running Out of Food in the Last Year: Never true    Ran Out of Food in the Last Year: Never true  Transportation Needs: No Transportation Needs (01/15/2023)   PRAPARE - Administrator, Civil Service (Medical): No    Lack of Transportation (Non-Medical): No  Physical Activity: Insufficiently Active (01/15/2023)   Exercise Vital Sign    Days of Exercise per Week: 3 days    Minutes of Exercise per Session: 30 min  Stress: No Stress Concern Present (01/15/2023)   [employer] of Occupational Health - Occupational Stress Questionnaire    Feeling of Stress : Only a little  Social Connections: Socially Integrated (01/15/2023)   Social Connection and Isolation Panel [NHANES]    Frequency of  Communication with Friends and Family: More than three times a week    Frequency of Social Gatherings with Friends and Family: Twice a week    Attends Religious Services: More than 4 times per year    Active Member of [employer] or Organizations: Yes    Attends Banker Meetings: More than 4 times per year    Marital Status: Married  Catering manager Violence: Not At Risk (04/30/2022)   Humiliation, Afraid, Rape, and Kick questionnaire    Fear of Current  or Ex-Partner: No    Emotionally Abused: No    Physically Abused: No    Sexually Abused: No   Family History  Problem Relation Age of Onset   Heart disease Mother    Heart disease Father    Family history is significant for a history of CABG in his father later in life   Review of Systems  All other systems reviewed and are negative.      Objective:   Physical Exam Vitals reviewed.  Constitutional:      General: He is not in acute distress.    Appearance: He is well-developed. He is not diaphoretic.  HENT:     Head: Normocephalic and atraumatic.     Right Ear: External ear normal.     Left Ear: External ear normal.     Nose: Nose normal.     Mouth/Throat:     Pharynx: No oropharyngeal exudate.  Eyes:     General: No scleral icterus.       Right eye: No discharge.        Left eye: No discharge.     Conjunctiva/sclera: Conjunctivae normal.     Pupils: Pupils are equal, round, and reactive to light.  Neck:     Thyroid: No thyromegaly.     Vascular: No JVD.     Trachea: No tracheal deviation.  Cardiovascular:     Rate and Rhythm: Normal rate and regular rhythm.     Heart sounds: Normal heart sounds. No murmur heard.    No friction rub. No gallop.  Pulmonary:     Effort: Pulmonary effort is normal. No respiratory distress.     Breath sounds: Normal breath sounds. No stridor. No wheezing or rales.  Chest:     Chest wall: No tenderness.  Abdominal:     General: Bowel sounds are normal. There is no distension.      Palpations: Abdomen is soft. There is no mass.     Tenderness: There is no abdominal tenderness. There is no guarding or rebound.  Genitourinary:    Penis: Normal.      Prostate: Normal.     Rectum: Normal.  Musculoskeletal:        General: No tenderness or deformity. Normal range of motion.     Cervical back: Normal range of motion and neck supple.  Lymphadenopathy:     Cervical: No cervical adenopathy.  Skin:    General: Skin is warm.     Coloration: Skin is not pale.     Findings: No erythema or rash.  Neurological:     Mental Status: He is alert and oriented to person, place, and time.     Cranial Nerves: No cranial nerve deficit.     Motor: No abnormal muscle tone.     Coordination: Coordination normal.     Deep Tendon Reflexes: Reflexes are normal and symmetric.  Psychiatric:        Behavior: Behavior normal.        Thought Content: Thought content normal.        Judgment: Judgment normal.           Assessment & Plan:  Benign essential HTN - Plan: COMPLETE METABOLIC PANEL WITH GFR  Coronary artery disease involving native coronary artery of native heart without angina pectoris - Plan: CBC with Differential/Platelet, COMPLETE METABOLIC PANEL WITH GFR  Gastrointestinal hemorrhage with melena - Plan: pantoprazole (PROTONIX) 40 MG tablet, CBC with Differential/Platelet, COMPLETE METABOLIC PANEL WITH GFR  [place]-Turner syndrome -  Plan: CBC with Differential/Platelet, COMPLETE METABOLIC PANEL WITH GFR  Aortic atherosclerosis (HCC) - Plan: CBC with Differential/Platelet, COMPLETE METABOLIC PANEL WITH GFR  Hypercholesteremia - Plan: CBC with Differential/Platelet, COMPLETE METABOLIC PANEL WITH GFR, Lipid panel  Fatigue, unspecified type - Plan: CBC with Differential/Platelet, TSH  Screening for prostate cancer - Plan: PSA We will resume metoprolol 12.5 mg twice daily both for his coronary artery disease as well as his essential tremor.  Given his history of GI  bleed I will follow a CBC because the patient is on aspirin daily.  Check fasting lipid panel.  Goal LDL is less than 55 given his history of coronary artery disease.  Will also screen for prostate cancer.  Blood pressure today is well-controlled

## 2023-01-22 LAB — COMPLETE METABOLIC PANEL WITH GFR
AG Ratio: 1.7 (calc) (ref 1.0–2.5)
ALT: 21 U/L (ref 9–46)
AST: 17 U/L (ref 10–35)
Albumin: 4.3 g/dL (ref 3.6–5.1)
Alkaline phosphatase (APISO): 64 U/L (ref 35–144)
BUN: 18 mg/dL (ref 7–25)
CO2: 27 mmol/L (ref 20–32)
Calcium: 9.7 mg/dL (ref 8.6–10.3)
Chloride: 105 mmol/L (ref 98–110)
Creat: 1.03 mg/dL (ref 0.70–1.28)
Globulin: 2.5 g/dL (ref 1.9–3.7)
Glucose, Bld: 96 mg/dL (ref 65–99)
Potassium: 4.5 mmol/L (ref 3.5–5.3)
Sodium: 139 mmol/L (ref 135–146)
Total Bilirubin: 0.5 mg/dL (ref 0.2–1.2)
Total Protein: 6.8 g/dL (ref 6.1–8.1)
eGFR: 76 mL/min/{1.73_m2} (ref >=60)

## 2023-01-22 LAB — LIPID PANEL
Cholesterol: 162 mg/dL (ref <200)
HDL: 42 mg/dL (ref >=40)
LDL Cholesterol (Calc): 86 mg/dL
Non-HDL Cholesterol (Calc): 120 mg/dL (ref <130)
Total CHOL/HDL Ratio: 3.9 (calc) (ref <5.0)
Triglycerides: 244 mg/dL — ABNORMAL HIGH (ref <150)

## 2023-01-22 LAB — CBC WITH DIFFERENTIAL/PLATELET
Absolute Lymphocytes: 1715 {cells}/uL (ref 850–3900)
Absolute Monocytes: 557 {cells}/uL (ref 200–950)
Basophils Absolute: 51 {cells}/uL (ref 0–200)
Basophils Relative: 0.8 %
Eosinophils Absolute: 198 {cells}/uL (ref 15–500)
Eosinophils Relative: 3.1 %
HCT: 43.1 % (ref 38.5–50.0)
Hemoglobin: 14.3 g/dL (ref 13.2–17.1)
MCH: 30.8 pg (ref 27.0–33.0)
MCHC: 33.2 g/dL (ref 32.0–36.0)
MCV: 92.7 fL (ref 80.0–100.0)
MPV: 11 fL (ref 7.5–12.5)
Monocytes Relative: 8.7 %
Neutro Abs: 3878 {cells}/uL (ref 1500–7800)
Neutrophils Relative %: 60.6 %
Platelets: 237 10*3/uL (ref 140–400)
RBC: 4.65 10*6/uL (ref 4.20–5.80)
RDW: 12.9 % (ref 11.0–15.0)
Total Lymphocyte: 26.8 %
WBC: 6.4 10*3/uL (ref 3.8–10.8)

## 2023-01-22 LAB — TSH: TSH: 1.26 m[IU]/L (ref 0.40–4.50)

## 2023-01-22 LAB — PSA: PSA: 0.37 ng/mL (ref <=4.00)

## 2023-02-08 DIAGNOSIS — M542 Cervicalgia: Secondary | ICD-10-CM | POA: Diagnosis not present

## 2023-02-23 DIAGNOSIS — L821 Other seborrheic keratosis: Secondary | ICD-10-CM | POA: Diagnosis not present

## 2023-02-23 DIAGNOSIS — L814 Other melanin hyperpigmentation: Secondary | ICD-10-CM | POA: Diagnosis not present

## 2023-02-23 DIAGNOSIS — Z85828 Personal history of other malignant neoplasm of skin: Secondary | ICD-10-CM | POA: Diagnosis not present

## 2023-02-23 DIAGNOSIS — L570 Actinic keratosis: Secondary | ICD-10-CM | POA: Diagnosis not present

## 2023-02-23 DIAGNOSIS — D045 Carcinoma in situ of skin of trunk: Secondary | ICD-10-CM | POA: Diagnosis not present

## 2023-02-24 ENCOUNTER — Other Ambulatory Visit: Payer: Self-pay

## 2023-02-24 ENCOUNTER — Telehealth: Payer: Self-pay | Admitting: Family Medicine

## 2023-02-24 ENCOUNTER — Ambulatory Visit: Payer: Self-pay | Admitting: Family Medicine

## 2023-02-24 DIAGNOSIS — E7800 Pure hypercholesterolemia, unspecified: Secondary | ICD-10-CM

## 2023-02-24 DIAGNOSIS — I2510 Atherosclerotic heart disease of native coronary artery without angina pectoris: Secondary | ICD-10-CM

## 2023-02-24 MED ORDER — ATORVASTATIN CALCIUM 80 MG PO TABS: 80.0000 mg | ORAL_TABLET | Freq: Every day | ORAL | 3 refills | Status: DC

## 2023-02-24 NOTE — Telephone Encounter (Unsigned)
 Copied from CRM [id number]. Topic: Clinical - Medication Refill >> Feb 24, 2023  2:01 PM Elle L wrote: Most Recent Primary Care Visit:  Provider: Eliane Grooms T  Department: [facility]  Visit Type: OFFICE VISIT  Date: 01/21/2023  Medication: atorvastatin  (LIPITOR) 80 MG tablet   Per patient and notes it was to be moved from 20 MG to 80MG  on 12/13 but was not called in at that time.   Has the patient contacted their pharmacy? Yes   Is this the correct pharmacy for this prescription? {yes/no:20286} If no, delete pharmacy and type the correct one.  This is the patient's preferred pharmacy:  [id number] [place], [place] - [address] AT [facility] [address] [place] [address] Phone: [phone] Fax: [phone]   Has the prescription been filled recently? {yes/no:20286}  Is the patient out of the medication? {yes/no:20286}  Has the patient been seen for an appointment in the last year OR does the patient have an upcoming appointment? {yes/no:20286}  Can we respond through MyChart? {yes/no:20286}  Agent: Please be advised that Rx refills may take up to 3 business days. We ask that you follow-up with your pharmacy.

## 2023-02-24 NOTE — Telephone Encounter (Signed)
 Copied from CRM [id number]. Topic: Clinical - Medication Refill >> Feb 24, 2023  2:01 PM Elle L wrote: Most Recent Primary Care Visit:  Provider: Eliane Grooms T  Department: [facility]  Visit Type: OFFICE VISIT  Date: 01/21/2023   Medication: atorvastatin  (LIPITOR) 80 MG tablet    Per patient and notes it was to be moved from 20 MG to 80MG  on 12/13 but was not called in at that time.    Has the patient contacted their pharmacy? Yes     Is this the correct pharmacy for this prescription? Yes If no, delete pharmacy and type the correct one.  This is the patient's preferred pharmacy:  [id number] - [place], [place] - [address] AT [facility] [address] [place] [address] Phone: [phone] Fax: [phone]

## 2023-02-24 NOTE — Telephone Encounter (Unsigned)
 Copied from CRM [id number]. Topic: Clinical - Medication Refill >> Feb 24, 2023  2:05 PM Elle L wrote: Most Recent Primary Care Visit:  Provider: Eliane Grooms T  Department: [facility]  Visit Type: OFFICE VISIT  Date: 01/21/2023  Medication: ***  Has the patient contacted their pharmacy? {yes/no:20286} (Agent: If no, request that the patient contact the pharmacy for the refill. If patient does not wish to contact the pharmacy document the reason why and proceed with request.) (Agent: If yes, when and what did the pharmacy advise?)  Is this the correct pharmacy for this prescription? {yes/no:20286} If no, delete pharmacy and type the correct one.  This is the patient's preferred pharmacy:  [id number] [place], [place] - [address] AT [facility] [address] [place] [address] Phone: [phone] Fax: [phone]   Has the prescription been filled recently? {yes/no:20286}  Is the patient out of the medication? {yes/no:20286}  Has the patient been seen for an appointment in the last year OR does the patient have an upcoming appointment? {yes/no:20286}  Can we respond through MyChart? {yes/no:20286}  Agent: Please be advised that Rx refills may take up to 3 business days. We ask that you follow-up with your pharmacy.

## 2023-03-10 DIAGNOSIS — M542 Cervicalgia: Secondary | ICD-10-CM | POA: Diagnosis not present

## 2023-04-07 DIAGNOSIS — M542 Cervicalgia: Secondary | ICD-10-CM | POA: Diagnosis not present

## 2023-04-13 ENCOUNTER — Other Ambulatory Visit: Payer: Self-pay | Admitting: Family Medicine

## 2023-04-13 DIAGNOSIS — G545 Neuralgic amyotrophy: Secondary | ICD-10-CM

## 2023-04-13 DIAGNOSIS — G250 Essential tremor: Secondary | ICD-10-CM

## 2023-05-05 DIAGNOSIS — M19031 Primary osteoarthritis, right wrist: Secondary | ICD-10-CM | POA: Diagnosis not present

## 2023-05-05 DIAGNOSIS — M542 Cervicalgia: Secondary | ICD-10-CM | POA: Diagnosis not present

## 2023-05-06 ENCOUNTER — Ambulatory Visit: Payer: Medicare HMO

## 2023-05-06 DIAGNOSIS — Z0000 Encounter for general adult medical examination without abnormal findings: Secondary | ICD-10-CM | POA: Diagnosis not present

## 2023-05-06 NOTE — Progress Notes (Signed)
 Subjective:   Richard Arnold. is a 73 y.o. male who presents for Medicare Annual/Subsequent preventive examination.  Visit Complete: Virtual I connected with  Richard Arnold. on 05/06/23 by a audio enabled telemedicine application and verified that I am speaking with the correct person using two identifiers.  Patient Location: Home  Provider Location: Home Office  I discussed the limitations of evaluation and management by telemedicine. The patient expressed understanding and agreed to proceed.  Vital Signs: Because this visit was a virtual/telehealth visit, some criteria may be missing or patient reported. Any vitals not documented were not able to be obtained and vitals that have been documented are patient reported. 04-30-2023 Patient Medicare AWV questionnaire was completed by the patient on 04-30-2023; I have confirmed that all information answered by patient is correct and no changes since this date.  Cardiac Risk Factors include: advanced age (>48men, >78 women);male gender;family history of premature cardiovascular disease     Objective:    There were no vitals filed for this visit. There is no height or weight on file to calculate BMI.     05/06/2023    3:20 PM 04/30/2022   11:13 AM 10/18/2020   10:10 AM 08/19/2020    9:10 AM 05/03/2020    9:26 AM 05/19/2018   12:17 PM 01/07/2016   12:40 PM  Advanced Directives  Does Patient Have a Medical Advance Directive? No No No No No No No  Would patient like information on creating a medical advance directive? No - Patient declined No - Patient declined No - Patient declined No - Patient declined Yes (MAU/Ambulatory/Procedural Areas - Information given) No - Patient declined No - Patient declined    Current Medications (verified) Outpatient Encounter Medications as of 05/06/2023  Medication Sig   acetaminophen (TYLENOL) 650 MG CR tablet Take 650 mg by mouth every 8 (eight) hours as needed for pain.   aspirin EC 81 MG tablet Take  81 mg by mouth daily. Swallow whole.   atorvastatin (LIPITOR) 80 MG tablet Take 1 tablet (80 mg total) by mouth daily.   diphenhydrAMINE (BENADRYL) 25 MG tablet Take 25 mg by mouth 2 (two) times daily.   ezetimibe (ZETIA) 10 MG tablet TAKE 1 TABLET BY MOUTH EVERY DAY   fish oil-omega-3 fatty acids 1000 MG capsule Take 2 g by mouth daily.   furosemide (LASIX) 20 MG tablet Take 1 tablet (20 mg total) by mouth daily as needed.   metoprolol tartrate (LOPRESSOR) 25 MG tablet Take 0.5 tablets (12.5 mg total) by mouth 2 (two) times daily.   Multiple Vitamin (MULTIVITAMIN WITH MINERALS) TABS tablet Take 1 tablet by mouth daily.   nitroGLYCERIN (NITROSTAT) 0.4 MG SL tablet PLACE 1 TABLET UNDER THE TONGUE EVERY 5 MINUTES AS NEEDED FOR CHEST PAIN YOU CAN TAKE UP TO 3 TABLET   pantoprazole (PROTONIX) 40 MG tablet Take 1 tablet (40 mg total) by mouth daily. TAKE 1 TABLET BY MOUTH TWICE A DAY BEFORE A MEAL   primidone (MYSOLINE) 50 MG tablet TAKE 2 TABLETS BY MOUTH AT BEDTIME.   tamsulosin (FLOMAX) 0.4 MG CAPS capsule TAKE 1 CAPSULE BY MOUTH EVERY DAY   TURMERIC PO Take 1 capsule by mouth daily.   valACYclovir (VALTREX) 1000 MG tablet Take 2 tablets (2,000 mg total) by mouth 2 (two) times daily. (Patient taking differently: Take 2,000 mg by mouth as needed (cold sores).)   vitamin C (ASCORBIC ACID) 500 MG tablet Take 500 mg by mouth daily.  Zinc 50 MG TABS Take 50 mg by mouth daily.   No facility-administered encounter medications on file as of 05/06/2023.    Allergies (verified) Bismuth subsalicylate, Monosodium glutamate, Latex, and Zoster vac recomb adjuvanted   History: Past Medical History:  Diagnosis Date   Arthritis    Brachial neuritis    Gastric ulcer    GI bleed    H pylori ulcer    Hypercholesteremia    Turner syndrome    left arm, some numbness in fingers   Past Surgical History:  Procedure Laterality Date   COLONOSCOPY WITH PROPOFOL N/A 03/05/2015   Procedure: COLONOSCOPY WITH  PROPOFOL;  Surgeon: Charolett Bumpers, MD;  Location: [facility];  Service: Endoscopy;  Laterality: N/A;   ESOPHAGOGASTRODUODENOSCOPY  09/08/2011   Procedure: ESOPHAGOGASTRODUODENOSCOPY (EGD);  Surgeon: Vertell Novak., MD;  Location: [facility];  Service: Endoscopy;  Laterality: N/A;   HERNIA REPAIR     INGUINAL HERNIA REPAIR Right 08/30/2013   Procedure: HERNIA REPAIR INGUINAL WITH MESH  ADULT;  Surgeon: Valarie Merino, MD;  Location: [facility] ORS;  Service: General;  Laterality: Right;   LEFT HEART CATH AND CORONARY ANGIOGRAPHY N/A 08/19/2020   Procedure: LEFT HEART CATH AND CORONARY ANGIOGRAPHY;  Surgeon: Swazil[place], Peter M, MD;  Location: [facility] INVASIVE CV LAB;  Service: Cardiovascular;  Laterality: N/A;   SHOULDER ARTHROSCOPY  2011   x4  one open rt, rotator cuff tear, Dr. Amanda Pea   TOTAL SHOULDER ARTHROPLASTY  01/30/2011   Procedure: TOTAL SHOULDER ARTHROPLASTY;  Surgeon: Verlee Rossetti;  Location: [facility] OR;  Service: Orthopedics;  Laterality: Right;  right total shoulder atrhoplasty   Family History  Problem Relation Age of Onset   Heart disease Mother    Heart disease Father    Social History   Socioeconomic History   Marital status: Married    Spouse name: Not on file   Number of children: Not on file   Years of education: Not on file   Highest education level: Some college, no degree  Occupational History   Not on file  Tobacco Use   Smoking status: Never   Smokeless tobacco: Never  Vaping Use   Vaping status: Never Used  Substance and Sexual Activity   Alcohol use: No   Drug use: No   Sexual activity: Yes  Other Topics Concern   Not on file  Social History Narrative   Not on file   Social Drivers of Health   Financial Resource Strain: Low Risk  (05/06/2023)   Overall Financial Resource Strain (CARDIA)    Difficulty of Paying Living Expenses: Not hard at all  Food Insecurity: No Food Insecurity (05/06/2023)   Hunger Vital Sign    Worried About Running Out of Food in  the Last Year: Never true    Ran Out of Food in the Last Year: Never true  Transportation Needs: No Transportation Needs (05/06/2023)   PRAPARE - Administrator, Civil Service (Medical): No    Lack of Transportation (Non-Medical): No  Physical Activity: Insufficiently Active (05/06/2023)   Exercise Vital Sign    Days of Exercise per Week: 3 days    Minutes of Exercise per Session: 30 min  Stress: No Stress Concern Present (05/06/2023)   [employer] of Occupational Health - Occupational Stress Questionnaire    Feeling of Stress : Only a little  Social Connections: Socially Integrated (05/06/2023)   Social Connection and Isolation Panel [NHANES]    Frequency of Communication with Friends  and Family: More than three times a week    Frequency of Social Gatherings with Friends and Family: Twice a week    Attends Religious Services: More than 4 times per year    Active Member of [employer] or Organizations: Yes    Attends Engineer, structural: More than 4 times per year    Marital Status: Married    Tobacco Counseling Counseling given: Not Answered   Clinical Intake:  Pre-visit preparation completed: Yes  Pain : No/denies pain     Diabetes: No  How often do you need to have someone help you when you read instructions, pamphlets, or other written materials from your doctor or pharmacy?: 1 - Never  Interpreter Needed?: No  Information entered by :: Remi Haggard LPN   Activities of Daily Living    05/06/2023    3:31 PM 04/30/2023    9:11 AM  In your present state of health, do you have any difficulty performing the following activities:  Hearing? 1 1  Vision? 0 0  Difficulty concentrating or making decisions? 0 0  Walking or climbing stairs? 0 0  Dressing or bathing? 0 0  Doing errands, shopping? 0 0  Preparing Food and eating ? N N  Using the Toilet? N N  In the past six months, have you accidently leaked urine? N N  Do you have problems with loss of  bowel control? N N  Managing your Medications? N N  Managing your Finances? N N  Housekeeping or managing your Housekeeping? N N    Patient Care Team: Donita Brooks, MD as PCP - General (Family Medicine) Christell Constant, MD as PCP - Cardiology (Cardiology) Aris Lot, MD as Consulting Physician (Dermatology) Carman Ching, OD (Optometry)  Indicate any recent Medical Services you may have received from other than [facility] providers in the past year (date may be approximate).     Assessment:   This is a routine wellness examination for Merle.  Hearing/Vision screen Hearing Screening - Comments:: Bilateral hearing aids Vision Screening - Comments:: Hyacinth Meeker Up to date   Goals Addressed   None    Depression Screen    05/06/2023    3:22 PM 01/21/2023    9:02 AM 05/07/2022    8:24 AM 04/30/2022   11:12 AM 10/18/2020   11:05 AM 10/18/2020   10:03 AM 05/03/2020    9:25 AM  PHQ 2/9 Scores  PHQ - 2 Score 0 0 0 0 0 0 0  PHQ- 9 Score 4 3         Fall Risk    05/06/2023    3:32 PM 04/30/2023    9:11 AM 01/21/2023    9:02 AM 05/07/2022    8:24 AM 04/30/2022   11:11 AM  Fall Risk   Falls in the past year? 1 1 0 0 0  Number falls in past yr: 0 0 0 0 0  Injury with Fall? 0 0 0 0 0  Risk for fall due to :    No Fall Risks No Fall Risks  Follow up Falls evaluation completed;Education provided;Falls prevention discussed   Falls prevention discussed Falls prevention discussed;Education provided;Falls evaluation completed    MEDICARE RISK AT HOME: Medicare Risk at Home Any stairs in or around the home?: Yes If so, are there any without handrails?: No Home free of loose throw rugs in walkways, pet beds, electrical cords, etc?: Yes Adequate lighting in your home to reduce risk of falls?: Yes Life  alert?: No Use of a cane, walker or w/c?: No Grab bars in the bathroom?: No Shower chair or bench in shower?: No Elevated toilet seat or a handicapped toilet?: Yes  TIMED UP  AND GO:  Was the test performed?  No    Cognitive Function:        04/30/2022   11:14 AM  6CIT Screen  What Year? 0 points  What month? 0 points  What time? 0 points  Count back from 20 0 points  Months in reverse 0 points  Repeat phrase 0 points  Total Score 0 points    Immunizations Immunization History  Administered Date(s) Administered   Influenza, High Dose Seasonal PF 11/11/2017, 12/24/2020   Influenza-Unspecified 11/23/2016, 11/29/2018, 11/10/2019, 11/09/2021   PFIZER(Purple Top)SARS-COV-2 Vaccination 04/17/2019, 04/20/2019   Pneumococcal Conjugate-13 04/19/2017   Pneumococcal Polysaccharide-23 05/01/2019   Zoster Recombinant(Shingrix) 05/07/2022    TDAP status: Due, Education has been provided regarding the importance of this vaccine. Advised may receive this vaccine at local pharmacy or Health Dept. Aware to provide a copy of the vaccination record if obtained from local pharmacy or Health Dept. Verbalized acceptance and understanding.  Flu Vaccine status: Due, Education has been provided regarding the importance of this vaccine. Advised may receive this vaccine at local pharmacy or Health Dept. Aware to provide a copy of the vaccination record if obtained from local pharmacy or Health Dept. Verbalized acceptance and understanding.  Pneumococcal vaccine status: Up to date  Covid-19 vaccine status: Information provided on how to obtain vaccines.   Qualifies for Shingles Vaccine? Yes   Zostavax completed No   Shingrix Completed?: No.    Education has been provided regarding the importance of this vaccine. Patient has been advised to call insurance company to determine out of pocket expense if they have not yet received this vaccine. Advised may also receive vaccine at local pharmacy or Health Dept. Verbalized acceptance and understanding.  Screening Tests Health Maintenance  Topic Date Due   Zoster Vaccines- Shingrix (2 of 2) 07/02/2022   COVID-19 Vaccine (3 -  2024-25 season) 10/11/2022   INFLUENZA VACCINE  05/10/2023 (Originally 09/10/2022)   Medicare Annual Wellness (AWV)  05/05/2024   Colonoscopy  01/11/2031   Pneumonia Vaccine 39+ Years old  Completed   Hepatitis C Screening  Completed   HPV VACCINES  Aged Out   DTaP/Tdap/Td  Discontinued    Health Maintenance  Health Maintenance Due  Topic Date Due   Zoster Vaccines- Shingrix (2 of 2) 07/02/2022   COVID-19 Vaccine (3 - 2024-25 season) 10/11/2022    Colorectal cancer screening: Type of screening: Colonoscopy. Completed 2022. Repeat every 10 years  Lung Cancer Screening: (Low Dose CT Chest recommended if Age 56-80 years, 20 pack-year currently smoking OR have quit w/in 15years.) does not qualify.   Lung Cancer Screening Referral:   Additional Screening:  Hepatitis C Screening: does not qualify; Completed 2019  Vision Screening: Recommended annual ophthalmology exams for early detection of glaucoma and other disorders of the eye. Is the patient up to date with their annual eye exam?  Yes  Who is the provider or what is the name of the office in which the patient attends annual eye exams? [facility] If pt is not established with a provider, would they like to be referred to a provider to establish care? No .   Dental Screening: Recommended annual dental exams for proper oral hygiene    Community Resource Referral / Chronic Care Management: CRR required this visit?  No   CCM required this visit?  No     Plan:     I have personally reviewed and noted the following in the patient's chart:   Medical and social history Use of alcohol, tobacco or illicit drugs  Current medications and supplements including opioid prescriptions. Patient is not currently taking opioid prescriptions. Functional ability and status Nutritional status Physical activity Advanced directives List of other physicians Hospitalizations, surgeries, and ER visits in previous 12 months Vitals Screenings to  include cognitive, depression, and falls Referrals and appointments  In addition, I have reviewed and discussed with patient certain preventive protocols, quality metrics, and best practice recommendations. A written personalized care plan for preventive services as well as general preventive health recommendations were provided to patient.     Remi Haggard, LPN   [id number]/61/0960   After Visit Summary: (MyChart) Due to this being a telephonic visit, the after visit summary with patients personalized plan was offered to patient via MyChart   Nurse Notes:

## 2023-05-06 NOTE — Patient Instructions (Signed)
 Richard Arnold , Thank you for taking time to come for your Medicare Wellness Visit. I appreciate your ongoing commitment to your health goals. Please review the following plan we discussed and let me know if I can assist you in the future.   Screening recommendations/referrals: Colonoscopy: up to date Recommended yearly ophthalmology/optometry visit for glaucoma screening and checkup Recommended yearly dental visit for hygiene and checkup  Vaccinations: Influenza vaccine: up to date Pneumococcal vaccine: up to date Tdap vaccine: Education provided Shingles vaccine: up to date        Preventive Care 65 Years and Older, Male Preventive care refers to lifestyle choices and visits with your health care provider that can promote health and wellness. What does preventive care include? A yearly physical exam. This is also called an annual well check. Dental exams once or twice a year. Routine eye exams. Ask your health care provider how often you should have your eyes checked. Personal lifestyle choices, including: Daily care of your teeth and gums. Regular physical activity. Eating a healthy diet. Avoiding tobacco and drug use. Limiting alcohol use. Practicing safe sex. Taking low doses of aspirin every day. Taking vitamin and mineral supplements as recommended by your health care provider. What happens during an annual well check? The services and screenings done by your health care provider during your annual well check will depend on your age, overall health, lifestyle risk factors, and family history of disease. Counseling  Your health care provider may ask you questions about your: Alcohol use. Tobacco use. Drug use. Emotional well-being. Home and relationship well-being. Sexual activity. Eating habits. History of falls. Memory and ability to understand (cognition). Work and work Astronomer. Screening  You may have the following tests or measurements: Height, weight, and  BMI. Blood pressure. Lipid and cholesterol levels. These may be checked every 5 years, or more frequently if you are over 66 years old. Skin check. Lung cancer screening. You may have this screening every year starting at age 60 if you have a 30-pack-year history of smoking and currently smoke or have quit within the past 15 years. Fecal occult blood test (FOBT) of the stool. You may have this test every year starting at age 65. Flexible sigmoidoscopy or colonoscopy. You may have a sigmoidoscopy every 5 years or a colonoscopy every 10 years starting at age 3. Prostate cancer screening. Recommendations will vary depending on your family history and other risks. Hepatitis C blood test. Hepatitis B blood test. Sexually transmitted disease (STD) testing. Diabetes screening. This is done by checking your blood sugar (glucose) after you have not eaten for a while (fasting). You may have this done every 1-3 years. Abdominal aortic aneurysm (AAA) screening. You may need this if you are a current or former smoker. Osteoporosis. You may be screened starting at age 62 if you are at high risk. Talk with your health care provider about your test results, treatment options, and if necessary, the need for more tests. Vaccines  Your health care provider may recommend certain vaccines, such as: Influenza vaccine. This is recommended every year. Tetanus, diphtheria, and acellular pertussis (Tdap, Td) vaccine. You may need a Td booster every 10 years. Zoster vaccine. You may need this after age 50. Pneumococcal 13-valent conjugate (PCV13) vaccine. One dose is recommended after age 29. Pneumococcal polysaccharide (PPSV23) vaccine. One dose is recommended after age 29. Talk to your health care provider about which screenings and vaccines you need and how often you need them. This information is not  intended to replace advice given to you by your health care provider. Make sure you discuss any questions you have  with your health care provider. Document Released: 02/22/2015 Document Revised: 10/16/2015 Document Reviewed: 11/27/2014 Elsevier Interactive Patient Education  2017 [employer].  Fall Prevention in the Home Falls can cause injuries. They can happen to people of all ages. There are many things you can do to make your home safe and to help prevent falls. What can I do on the outside of my home? Regularly fix the edges of walkways and driveways and fix any cracks. Remove anything that might make you trip as you walk through a door, such as a raised step or threshold. Trim any bushes or trees on the path to your home. Use bright outdoor lighting. Clear any walking paths of anything that might make someone trip, such as rocks or tools. Regularly check to see if handrails are loose or broken. Make sure that both sides of any steps have handrails. Any raised decks and porches should have guardrails on the edges. Have any leaves, snow, or ice cleared regularly. Use sand or salt on walking paths during winter. Clean up any spills in your garage right away. This includes oil or grease spills. What can I do in the bathroom? Use night lights. Install grab bars by the toilet and in the tub and shower. Do not use towel bars as grab bars. Use non-skid mats or decals in the tub or shower. If you need to sit down in the shower, use a plastic, non-slip stool. Keep the floor dry. Clean up any water that spills on the floor as soon as it happens. Remove soap buildup in the tub or shower regularly. Attach bath mats securely with double-sided non-slip rug tape. Do not have throw rugs and other things on the floor that can make you trip. What can I do in the bedroom? Use night lights. Make sure that you have a light by your bed that is easy to reach. Do not use any sheets or blankets that are too big for your bed. They should not hang down onto the floor. Have a firm chair that has side arms. You can use  this for support while you get dressed. Do not have throw rugs and other things on the floor that can make you trip. What can I do in the kitchen? Clean up any spills right away. Avoid walking on wet floors. Keep items that you use a lot in easy-to-reach places. If you need to reach something above you, use a strong step stool that has a grab bar. Keep electrical cords out of the way. Do not use floor polish or wax that makes floors slippery. If you must use wax, use non-skid floor wax. Do not have throw rugs and other things on the floor that can make you trip. What can I do with my stairs? Do not leave any items on the stairs. Make sure that there are handrails on both sides of the stairs and use them. Fix handrails that are broken or loose. Make sure that handrails are as long as the stairways. Check any carpeting to make sure that it is firmly attached to the stairs. Fix any carpet that is loose or worn. Avoid having throw rugs at the top or bottom of the stairs. If you do have throw rugs, attach them to the floor with carpet tape. Make sure that you have a light switch at the top of the stairs  and the bottom of the stairs. If you do not have them, ask someone to add them for you. What else can I do to help prevent falls? Wear shoes that: Do not have high heels. Have rubber bottoms. Are comfortable and fit you well. Are closed at the toe. Do not wear sandals. If you use a stepladder: Make sure that it is fully opened. Do not climb a closed stepladder. Make sure that both sides of the stepladder are locked into place. Ask someone to hold it for you, if possible. Clearly mark and make sure that you can see: Any grab bars or handrails. First and last steps. Where the edge of each step is. Use tools that help you move around (mobility aids) if they are needed. These include: Canes. Walkers. Scooters. Crutches. Turn on the lights when you go into a dark area. Replace any light bulbs  as soon as they burn out. Set up your furniture so you have a clear path. Avoid moving your furniture around. If any of your floors are uneven, fix them. If there are any pets around you, be aware of where they are. Review your medicines with your doctor. Some medicines can make you feel dizzy. This can increase your chance of falling. Ask your doctor what other things that you can do to help prevent falls. This information is not intended to replace advice given to you by your health care provider. Make sure you discuss any questions you have with your health care provider. Document Released: 11/22/2008 Document Revised: 07/04/2015 Document Reviewed: 03/02/2014 Elsevier Interactive Patient Education  2017 [employer].

## 2023-05-07 ENCOUNTER — Other Ambulatory Visit: Payer: Medicare HMO

## 2023-05-07 DIAGNOSIS — E7800 Pure hypercholesterolemia, unspecified: Secondary | ICD-10-CM | POA: Diagnosis not present

## 2023-05-08 LAB — LIPID PANEL
Cholesterol: 122 mg/dL (ref <200)
HDL: 46 mg/dL (ref >=40)
LDL Cholesterol (Calc): 54 mg/dL
Non-HDL Cholesterol (Calc): 76 mg/dL (ref <130)
Total CHOL/HDL Ratio: 2.7 (calc) (ref <5.0)
Triglycerides: 138 mg/dL (ref <150)

## 2023-05-10 ENCOUNTER — Encounter: Payer: Self-pay | Admitting: Family Medicine

## 2023-05-10 ENCOUNTER — Ambulatory Visit (INDEPENDENT_AMBULATORY_CARE_PROVIDER_SITE_OTHER): Payer: Medicare HMO | Admitting: Family Medicine

## 2023-05-10 VITALS — BP 130/70 | HR 63 | Temp 98.7°F | Ht 68.0 in | Wt 204.8 lb

## 2023-05-10 DIAGNOSIS — N529 Male erectile dysfunction, unspecified: Secondary | ICD-10-CM | POA: Insufficient documentation

## 2023-05-10 DIAGNOSIS — Z1211 Encounter for screening for malignant neoplasm of colon: Secondary | ICD-10-CM

## 2023-05-10 DIAGNOSIS — I2510 Atherosclerotic heart disease of native coronary artery without angina pectoris: Secondary | ICD-10-CM | POA: Diagnosis not present

## 2023-05-10 DIAGNOSIS — I10 Essential (primary) hypertension: Secondary | ICD-10-CM

## 2023-05-10 DIAGNOSIS — Z0001 Encounter for general adult medical examination with abnormal findings: Secondary | ICD-10-CM

## 2023-05-10 DIAGNOSIS — Z860101 Personal history of adenomatous and serrated colon polyps: Secondary | ICD-10-CM | POA: Insufficient documentation

## 2023-05-10 DIAGNOSIS — Z0000 Encounter for general adult medical examination without abnormal findings: Secondary | ICD-10-CM

## 2023-05-10 DIAGNOSIS — E7800 Pure hypercholesterolemia, unspecified: Secondary | ICD-10-CM

## 2023-05-10 DIAGNOSIS — J309 Allergic rhinitis, unspecified: Secondary | ICD-10-CM | POA: Insufficient documentation

## 2023-05-10 DIAGNOSIS — Z23 Encounter for immunization: Secondary | ICD-10-CM

## 2023-05-10 MED ORDER — TIRZEPATIDE-WEIGHT MANAGEMENT 2.5 MG/0.5ML ~~LOC~~ SOLN: 2.5000 mg | SUBCUTANEOUS | 3 refills | Status: DC

## 2023-05-10 NOTE — Addendum Note (Signed)
 Addended by: Venia Carbon K on: 05/10/2023 12:18 PM   Modules accepted: Orders

## 2023-05-10 NOTE — Progress Notes (Signed)
 Subjective:    Patient ID: Richard Arnold., male    DOB: 1950/06/24, 73 y.o.   MRN: [id number]  HPI Patient is here today for complete physical exam.  Last colonoscopy that I have records of this from 2017.  He had a tubular adenoma at that time.  He appears to be due for repeat colonoscopy as it has been more than 7 years.  His last PSA was checked in December and was normal.  He is due for the second dose of the shingles vaccine.  He is also due for the RSV vaccine and a tetanus shot.  We discussed these today and he would like to get the second shingles vaccine today.  Otherwise he is doing well.  His only concern is elevated weight.  Patient is 5 foot 8 and 204 pounds.  This causes low back pain.  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   Past Surgical History:  Procedure Laterality Date   COLONOSCOPY WITH PROPOFOL N/A 03/05/2015   Procedure: COLONOSCOPY WITH PROPOFOL;  Surgeon: Charolett Bumpers, MD;  Location: [facility];  Service: Endoscopy;  Laterality: N/A;   ESOPHAGOGASTRODUODENOSCOPY  09/08/2011   Procedure: ESOPHAGOGASTRODUODENOSCOPY (EGD);  Surgeon: Vertell Novak., MD;  Location: [facility];  Service: Endoscopy;  Laterality: N/A;   HERNIA REPAIR     INGUINAL HERNIA REPAIR Right 08/30/2013   Procedure: HERNIA REPAIR INGUINAL WITH MESH  ADULT;  Surgeon: Valarie Merino, MD;  Location: [facility];  Service: General;  Laterality: Right;   LEFT HEART CATH AND CORONARY ANGIOGRAPHY N/A 08/19/2020   Procedure: LEFT HEART CATH AND CORONARY ANGIOGRAPHY;  Surgeon: Swaziland, Peter M, MD;  Location: [facility] INVASIVE CV LAB;  Service: Cardiovascular;  Laterality: N/A;   SHOULDER ARTHROSCOPY   2011   x4  one open rt, rotator cuff tear, Dr. Amanda Pea   TOTAL SHOULDER ARTHROPLASTY  01/30/2011   Procedure: TOTAL SHOULDER ARTHROPLASTY;  Surgeon: Verlee Rossetti;  Location: [facility];  Service: Orthopedics;  Laterality: Right;  right total shoulder atrhoplasty   Current Outpatient Medications on File Prior to Visit  Medication Sig Dispense Refill   acetaminophen (TYLENOL) 650 MG CR tablet Take 650 mg by mouth every 8 (eight) hours as needed for pain.     aspirin EC 81 MG tablet Take 81 mg by mouth daily. Swallow whole.     atorvastatin (LIPITOR) 80 MG tablet Take 1 tablet (80 mg total) by mouth daily. 90 tablet 3   diphenhydrAMINE (BENADRYL) 25 MG tablet Take 25 mg by mouth 2 (two) times daily.     ezetimibe (ZETIA) 10 MG tablet TAKE 1 TABLET BY MOUTH EVERY DAY 90 tablet 3   fish oil-omega-3 fatty acids 1000 MG capsule Take 2 g by mouth daily.     furosemide (LASIX) 20 MG tablet Take 1 tablet (20 mg total) by mouth daily as needed. 30 tablet 11   metoprolol tartrate (LOPRESSOR) 25 MG tablet Take 0.5 tablets (12.5 mg total) by mouth 2 (two) times daily. 90 tablet 3   Multiple Vitamin (MULTIVITAMIN WITH MINERALS) TABS tablet Take 1 tablet  by mouth daily.     nitroGLYCERIN (NITROSTAT) 0.4 MG SL tablet PLACE 1 TABLET UNDER THE TONGUE EVERY 5 MINUTES AS NEEDED FOR CHEST PAIN YOU CAN TAKE UP TO 3 TABLET 25 tablet 11   pantoprazole (PROTONIX) 40 MG tablet Take 1 tablet (40 mg total) by mouth daily. TAKE 1 TABLET BY MOUTH TWICE A DAY BEFORE A MEAL 90 tablet 3   primidone (MYSOLINE) 50 MG tablet TAKE 2 TABLETS BY MOUTH AT BEDTIME. 180 tablet 1   tamsulosin (FLOMAX) 0.4 MG CAPS capsule TAKE 1 CAPSULE BY MOUTH EVERY DAY 90 capsule 1   TURMERIC PO Take 1 capsule by mouth daily.     valACYclovir (VALTREX) 1000 MG tablet Take 2 tablets (2,000 mg total) by mouth 2 (two) times daily. (Patient taking differently: Take 2,000 mg by mouth as needed (cold sores).) 4 tablet 0   vitamin C (ASCORBIC ACID) 500 MG tablet  Take 500 mg by mouth daily.     Zinc 50 MG TABS Take 50 mg by mouth daily.     No current facility-administered medications on file prior to visit.   Allergies  Allergen Reactions   Bismuth Subsalicylate     Other reaction(s): hives   Monosodium Glutamate Swelling    Rash, throat swells   Latex Rash   Tyloxapol Anxiety   Zoster Vac Recomb Adjuvanted Dermatitis   Social History   Socioeconomic History   Marital status: Married    Spouse name: Not on file   Number of children: Not on file   Years of education: Not on file   Highest education level: Associate degree: occupational, Scientist, product/process development, or vocational program  Occupational History   Not on file  Tobacco Use   Smoking status: Never   Smokeless tobacco: Never  Vaping Use   Vaping status: Never Used  Substance and Sexual Activity   Alcohol use: No   Drug use: No   Sexual activity: Yes  Other Topics Concern   Not on file  Social History Narrative   Not on file   Social Drivers of Health   Financial Resource Strain: Low Risk  (05/09/2023)   Overall Financial Resource Strain (CARDIA)    Difficulty of Paying Living Expenses: Not hard at all  Food Insecurity: No Food Insecurity (05/09/2023)   Hunger Vital Sign    Worried About Running Out of Food in the Last Year: Never true    Ran Out of Food in the Last Year: Never true  Transportation Needs: No Transportation Needs (05/09/2023)   PRAPARE - Administrator, Civil Service (Medical): No    Lack of Transportation (Non-Medical): No  Physical Activity: Insufficiently Active (05/09/2023)   Exercise Vital Sign    Days of Exercise per Week: 2 days    Minutes of Exercise per Session: 60 min  Stress: Stress Concern Present (05/09/2023)   [employer] of Occupational Health - Occupational Stress Questionnaire    Feeling of Stress : To some extent  Social Connections: Socially Integrated (05/09/2023)   Social Connection and Isolation Panel [NHANES]    Frequency of  Communication with Friends and Family: More than three times a week    Frequency of Social Gatherings with Friends and Family: More than three times a week    Attends Religious Services: More than 4 times per year    Active Member of [employer] or Organizations: Yes    Attends Banker Meetings: More than 4 times per year    Marital Status:  Married  Intimate Partner Violence: Not At Risk (05/06/2023)   Humiliation, Afraid, Rape, and Kick questionnaire    Fear of Current or Ex-Partner: No    Emotionally Abused: No    Physically Abused: No    Sexually Abused: No   Family History  Problem Relation Age of Onset   Heart disease Mother    Heart disease Father    Family history is significant for a history of CABG in his father later in life   Review of Systems  All other systems reviewed and are negative.      Objective:   Physical Exam Vitals reviewed.  Constitutional:      General: He is not in acute distress.    Appearance: He is well-developed. He is not diaphoretic.  HENT:     Head: Normocephalic and atraumatic.     Right Ear: External ear normal.     Left Ear: External ear normal.     Nose: Nose normal.     Mouth/Throat:     Pharynx: No oropharyngeal exudate.  Eyes:     General: No scleral icterus.       Right eye: No discharge.        Left eye: No discharge.     Conjunctiva/sclera: Conjunctivae normal.     Pupils: Pupils are equal, round, and reactive to light.  Neck:     Thyroid: No thyromegaly.     Vascular: No JVD.     Trachea: No tracheal deviation.  Cardiovascular:     Rate and Rhythm: Normal rate and regular rhythm.     Heart sounds: Normal heart sounds. No murmur heard.    No friction rub. No gallop.  Pulmonary:     Effort: Pulmonary effort is normal. No respiratory distress.     Breath sounds: Normal breath sounds. No stridor. No wheezing or rales.  Chest:     Chest wall: No tenderness.  Abdominal:     General: Bowel sounds are normal. There  is no distension.     Palpations: Abdomen is soft. There is no mass.     Tenderness: There is no abdominal tenderness. There is no guarding or rebound.  Genitourinary:    Penis: Normal.      Prostate: Normal.     Rectum: Normal.  Musculoskeletal:        General: No tenderness or deformity. Normal range of motion.     Cervical back: Normal range of motion and neck supple.  Lymphadenopathy:     Cervical: No cervical adenopathy.  Skin:    General: Skin is warm.     Coloration: Skin is not pale.     Findings: No erythema or rash.  Neurological:     Mental Status: He is alert and oriented to person, place, and time.     Cranial Nerves: No cranial nerve deficit.     Motor: No abnormal muscle tone.     Coordination: Coordination normal.     Deep Tendon Reflexes: Reflexes are normal and symmetric.  Psychiatric:        Behavior: Behavior normal.        Thought Content: Thought content normal.        Judgment: Judgment normal.           Assessment & Plan:  Colon cancer screening - Plan: Ambulatory referral to Gastroenterology  Pure hypercholesterolemia - Plan: CBC with Differential/Platelet, COMPLETE METABOLIC PANEL WITHOUT GFR  Coronary artery disease involving native coronary artery of native heart without angina  pectoris  Benign essential HTN  Encounter for Medicare annual wellness exam Physical exam today is normal.  Schedule the patient to meet with gastroenterology for a colonoscopy.  PSA is not due until December.  Blood pressure is well-controlled.  Cholesterol is outstanding.  I will check a CBC and a CMP.  Patient received his second shingles vaccine today.  Recommended RSV vaccine along with a flu shot and a COVID shot in the fall.  We discussed Zepbound.  He would like to try this medication and his insurance will cover it

## 2023-05-11 LAB — COMPLETE METABOLIC PANEL WITHOUT GFR
AG Ratio: 1.8 (calc) (ref 1.0–2.5)
ALT: 25 U/L (ref 9–46)
AST: 19 U/L (ref 10–35)
Albumin: 4.4 g/dL (ref 3.6–5.1)
Alkaline phosphatase (APISO): 56 U/L (ref 35–144)
BUN: 21 mg/dL (ref 7–25)
CO2: 29 mmol/L (ref 20–32)
Calcium: 9.9 mg/dL (ref 8.6–10.3)
Chloride: 103 mmol/L (ref 98–110)
Creat: 1.1 mg/dL (ref 0.70–1.28)
Globulin: 2.4 g/dL (ref 1.9–3.7)
Glucose, Bld: 95 mg/dL (ref 65–99)
Potassium: 4.7 mmol/L (ref 3.5–5.3)
Sodium: 139 mmol/L (ref 135–146)
Total Bilirubin: 0.6 mg/dL (ref 0.2–1.2)
Total Protein: 6.8 g/dL (ref 6.1–8.1)

## 2023-05-11 LAB — CBC WITH DIFFERENTIAL/PLATELET
Absolute Lymphocytes: 1880 {cells}/uL (ref 850–3900)
Absolute Monocytes: 703 {cells}/uL (ref 200–950)
Basophils Absolute: 47 {cells}/uL (ref 0–200)
Basophils Relative: 0.6 %
Eosinophils Absolute: 198 {cells}/uL (ref 15–500)
Eosinophils Relative: 2.5 %
HCT: 40.6 % (ref 38.5–50.0)
Hemoglobin: 13.7 g/dL (ref 13.2–17.1)
MCH: 31.8 pg (ref 27.0–33.0)
MCHC: 33.7 g/dL (ref 32.0–36.0)
MCV: 94.2 fL (ref 80.0–100.0)
MPV: 10.4 fL (ref 7.5–12.5)
Monocytes Relative: 8.9 %
Neutro Abs: 5072 {cells}/uL (ref 1500–7800)
Neutrophils Relative %: 64.2 %
Platelets: 267 10*3/uL (ref 140–400)
RBC: 4.31 10*6/uL (ref 4.20–5.80)
RDW: 12.8 % (ref 11.0–15.0)
Total Lymphocyte: 23.8 %
WBC: 7.9 10*3/uL (ref 3.8–10.8)

## 2023-06-02 DIAGNOSIS — M19031 Primary osteoarthritis, right wrist: Secondary | ICD-10-CM | POA: Diagnosis not present

## 2023-06-27 ENCOUNTER — Other Ambulatory Visit: Payer: Self-pay | Admitting: Internal Medicine

## 2023-07-07 ENCOUNTER — Encounter: Payer: Self-pay | Admitting: Family Medicine

## 2023-07-14 ENCOUNTER — Other Ambulatory Visit: Payer: Self-pay

## 2023-07-14 MED ORDER — EZETIMIBE 10 MG PO TABS: 10.0000 mg | ORAL_TABLET | Freq: Every day | ORAL | 0 refills | Status: DC

## 2023-07-15 ENCOUNTER — Ambulatory Visit (INDEPENDENT_AMBULATORY_CARE_PROVIDER_SITE_OTHER): Admitting: Family Medicine

## 2023-07-15 VITALS — BP 124/62 | HR 66 | Temp 98.5°F | Ht 68.0 in | Wt 201.0 lb

## 2023-07-15 DIAGNOSIS — M5030 Other cervical disc degeneration, unspecified cervical region: Secondary | ICD-10-CM

## 2023-07-15 DIAGNOSIS — M5412 Radiculopathy, cervical region: Secondary | ICD-10-CM | POA: Diagnosis not present

## 2023-07-15 NOTE — Progress Notes (Signed)
 Subjective:    Patient ID: Richard Arnold., male    DOB: 28-May-1950, 73 y.o.   MRN: [id number]  Shoulder Pain   Leg Pain   Has been seeing his orthopedist for the last 6 months complaining of pain in the center of his neck.  He has been on multiple rounds of steroids.  X-rays have been inconclusive.  Recently, the patient started developing burning aching throbbing pain radiating into his left shoulder blade and down his left arm into his left hand.  He has some numbness around his left elbow and in his 4th and 5th digits.  He has a history of [place]-Turner syndrome in his left arm.  He is concerned about this returning.  There are also 3 erythema papules on the neck at his hairline but there are no bumps spreading down his left arm. Past Medical History:  Diagnosis Date   Arthritis    Brachial neuritis    Gastric ulcer    GI bleed    H pylori ulcer    Hypercholesteremia    Turner syndrome    left arm, some numbness in fingers   Past Surgical History:  Procedure Laterality Date   COLONOSCOPY WITH PROPOFOL  N/A 03/05/2015   Procedure: COLONOSCOPY WITH PROPOFOL ;  Surgeon: Garrett Kallman, MD;  Location: [facility];  Service: Endoscopy;  Laterality: N/A;   ESOPHAGOGASTRODUODENOSCOPY  09/08/2011   Procedure: ESOPHAGOGASTRODUODENOSCOPY (EGD);  Surgeon: Venson Ginger., MD;  Location: [facility];  Service: Endoscopy;  Laterality: N/A;   HERNIA REPAIR     INGUINAL HERNIA REPAIR Right 08/30/2013   Procedure: HERNIA REPAIR INGUINAL WITH MESH  ADULT;  Surgeon: Azucena Bollard, MD;  Location: [facility];  Service: General;  Laterality: Right;   LEFT HEART CATH AND CORONARY ANGIOGRAPHY N/A 08/19/2020   Procedure: LEFT HEART CATH AND CORONARY ANGIOGRAPHY;  Surgeon: Swaziland, Peter M, MD;  Location: [facility] INVASIVE CV LAB;  Service: Cardiovascular;  Laterality: N/A;   SHOULDER ARTHROSCOPY  2011   x4  one open rt, rotator cuff tear, Dr. Aloha Arnold   TOTAL SHOULDER ARTHROPLASTY  01/30/2011    Procedure: TOTAL SHOULDER ARTHROPLASTY;  Surgeon: Lorriane Rote;  Location: [facility];  Service: Orthopedics;  Laterality: Right;  right total shoulder atrhoplasty   Current Outpatient Medications on File Prior to Visit  Medication Sig Dispense Refill   furosemide  (LASIX ) 20 MG tablet TAKE 1 TABLET BY MOUTH EVERY DAY AS NEEDED 30 tablet 1   acetaminophen  (TYLENOL ) 650 MG CR tablet Take 650 mg by mouth every 8 (eight) hours as needed for pain.     aspirin  EC 81 MG tablet Take 81 mg by mouth daily. Swallow whole.     atorvastatin  (LIPITOR) 80 MG tablet Take 1 tablet (80 mg total) by mouth daily. 90 tablet 3   diphenhydrAMINE (BENADRYL) 25 MG tablet Take 25 mg by mouth 2 (two) times daily.     ezetimibe  (ZETIA ) 10 MG tablet Take 1 tablet (10 mg total) by mouth daily. 30 tablet 0   fish oil-omega-3 fatty acids  1000 MG capsule Take 2 g by mouth daily.     metoprolol  tartrate (LOPRESSOR ) 25 MG tablet Take 0.5 tablets (12.5 mg total) by mouth 2 (two) times daily. 90 tablet 3   Multiple Vitamin (MULTIVITAMIN WITH MINERALS) TABS tablet Take 1 tablet by mouth daily.     nitroGLYCERIN  (NITROSTAT ) 0.4 MG SL tablet PLACE 1 TABLET UNDER THE TONGUE EVERY 5 MINUTES AS NEEDED FOR CHEST PAIN YOU CAN  TAKE UP TO 3 TABLET 25 tablet 11   pantoprazole  (PROTONIX ) 40 MG tablet Take 1 tablet (40 mg total) by mouth daily. TAKE 1 TABLET BY MOUTH TWICE A DAY BEFORE A MEAL 90 tablet 3   primidone  (MYSOLINE ) 50 MG tablet TAKE 2 TABLETS BY MOUTH AT BEDTIME. 180 tablet 1   tamsulosin  (FLOMAX ) 0.4 MG CAPS capsule TAKE 1 CAPSULE BY MOUTH EVERY DAY 90 capsule 1   tirzepatide (ZEPBOUND) 2.5 MG/0.5ML injection vial Inject 2.5 mg into the skin once a week. 2 mL 3   TURMERIC PO Take 1 capsule by mouth daily.     valACYclovir  (VALTREX ) 1000 MG tablet Take 2 tablets (2,000 mg total) by mouth 2 (two) times daily. (Patient taking differently: Take 2,000 mg by mouth as needed (cold sores).) 4 tablet 0   vitamin C  (ASCORBIC ACID ) 500 MG tablet  Take 500 mg by mouth daily.     Zinc 50 MG TABS Take 50 mg by mouth daily.     No current facility-administered medications on file prior to visit.   Allergies  Allergen Reactions   Bismuth Subsalicylate     Other reaction(s): hives   Monosodium Glutamate Swelling    Rash, throat swells   Latex Rash   Tyloxapol Anxiety   Zoster Vac Recomb Adjuvanted Dermatitis   Social History   Socioeconomic History   Marital status: Married    Spouse name: Not on file   Number of children: Not on file   Years of education: Not on file   Highest education level: Associate degree: occupational, Scientist, product/process development, or vocational program  Occupational History   Not on file  Tobacco Use   Smoking status: Never   Smokeless tobacco: Never  Vaping Use   Vaping status: Never Used  Substance and Sexual Activity   Alcohol use: No   Drug use: No   Sexual activity: Yes  Other Topics Concern   Not on file  Social History Narrative   Not on file   Social Drivers of Health   Financial Resource Strain: Low Risk  (05/09/2023)   Overall Financial Resource Strain (CARDIA)    Difficulty of Paying Living Expenses: Not hard at all  Food Insecurity: No Food Insecurity (05/09/2023)   Hunger Vital Sign    Worried About Running Out of Food in the Last Year: Never true    Ran Out of Food in the Last Year: Never true  Transportation Needs: No Transportation Needs (05/09/2023)   PRAPARE - Administrator, Civil Service (Medical): No    Lack of Transportation (Non-Medical): No  Physical Activity: Insufficiently Active (05/09/2023)   Exercise Vital Sign    Days of Exercise per Week: 2 days    Minutes of Exercise per Session: 60 min  Stress: Stress Concern Present (05/09/2023)   [employer] of Occupational Health - Occupational Stress Questionnaire    Feeling of Stress : To some extent  Social Connections: Socially Integrated (05/09/2023)   Social Connection and Isolation Panel [NHANES]    Frequency of  Communication with Friends and Family: More than three times a week    Frequency of Social Gatherings with Friends and Family: More than three times a week    Attends Religious Services: More than 4 times per year    Active Member of [employer] or Organizations: Yes    Attends Banker Meetings: More than 4 times per year    Marital Status: Married  Catering manager Violence: Not At Risk (05/06/2023)  Humiliation, Afraid, Rape, and Kick questionnaire    Fear of Current or Ex-Partner: No    Emotionally Abused: No    Physically Abused: No    Sexually Abused: No   Family History  Problem Relation Age of Onset   Heart disease Mother    Heart disease Father    Family history is significant for a history of CABG in his father later in life   Review of Systems  All other systems reviewed and are negative.      Objective:   Physical Exam Vitals reviewed.  Constitutional:      General: He is not in acute distress.    Appearance: He is well-developed. He is not diaphoretic.  HENT:     Head: Normocephalic and atraumatic.     Right Ear: External ear normal.     Left Ear: External ear normal.     Nose: Nose normal.     Mouth/Throat:     Pharynx: No oropharyngeal exudate.  Eyes:     General: No scleral icterus.       Right eye: No discharge.        Left eye: No discharge.     Conjunctiva/sclera: Conjunctivae normal.     Pupils: Pupils are equal, round, and reactive to light.  Neck:     Thyroid : No thyromegaly.     Vascular: No JVD.     Trachea: No tracheal deviation.  Cardiovascular:     Rate and Rhythm: Normal rate and regular rhythm.     Heart sounds: Normal heart sounds. No murmur heard.    No friction rub. No gallop.  Pulmonary:     Effort: Pulmonary effort is normal. No respiratory distress.     Breath sounds: Normal breath sounds. No stridor. No wheezing or rales.  Chest:     Chest wall: No tenderness.  Abdominal:     General: Bowel sounds are normal. There  is no distension.     Palpations: Abdomen is soft. There is no mass.     Tenderness: There is no abdominal tenderness. There is no guarding or rebound.  Musculoskeletal:        General: No deformity.     Cervical back: Neck supple. Tenderness present. Pain with movement and muscular tenderness present. No spinous process tenderness. Decreased range of motion.  Lymphadenopathy:     Cervical: No cervical adenopathy.  Skin:    General: Skin is warm.     Coloration: Skin is not pale.     Findings: No erythema or rash.  Neurological:     Mental Status: He is alert and oriented to person, place, and time.     Cranial Nerves: No cranial nerve deficit.     Motor: No abnormal muscle tone.     Coordination: Coordination normal.     Deep Tendon Reflexes: Reflexes are normal and symmetric.  Psychiatric:        Behavior: Behavior normal.        Thought Content: Thought content normal.        Judgment: Judgment normal.           Assessment & Plan:  Cervical radiculopathy - Plan: MR Cervical Spine Wo Contrast I suspect the patient is having neuropathic pain in his left arm.  I suspect the source is due to his cervical degenerative disc disease with possible nerve impingement.  Proceed with an MRI of the neck to evaluate further.  Monitor for spread of the rash to indicate shingles

## 2023-07-21 ENCOUNTER — Ambulatory Visit
Admission: RE | Admit: 2023-07-21 | Discharge: 2023-07-21 | Discharge status: Home / Self Care | Source: Ambulatory Visit | Attending: Family Medicine | Admitting: Family Medicine

## 2023-07-21 DIAGNOSIS — M4722 Other spondylosis with radiculopathy, cervical region: Secondary | ICD-10-CM | POA: Diagnosis not present

## 2023-07-21 DIAGNOSIS — M5412 Radiculopathy, cervical region: Secondary | ICD-10-CM

## 2023-07-21 DIAGNOSIS — M5030 Other cervical disc degeneration, unspecified cervical region: Secondary | ICD-10-CM

## 2023-07-21 DIAGNOSIS — M4802 Spinal stenosis, cervical region: Secondary | ICD-10-CM | POA: Diagnosis not present

## 2023-07-27 ENCOUNTER — Other Ambulatory Visit: Payer: Self-pay

## 2023-07-27 ENCOUNTER — Encounter: Payer: Self-pay | Admitting: Family Medicine

## 2023-07-27 MED ORDER — FUROSEMIDE 20 MG PO TABS: 20.0000 mg | ORAL_TABLET | Freq: Every day | ORAL | 0 refills | Status: AC | PRN

## 2023-07-27 NOTE — Telephone Encounter (Unsigned)
 Copied from CRM [id number]. Topic: Clinical - Lab/Test Results >> Jul 27, 2023  9:58 AM Stanly Early wrote: Reason for CRM: patient is calling for MRI results, Please call [phone]

## 2023-08-05 ENCOUNTER — Telehealth: Payer: Self-pay | Admitting: Family Medicine

## 2023-08-05 NOTE — Telephone Encounter (Unsigned)
 Copied from CRM [id number]. Topic: Clinical - Lab/Test Results >> Aug 05, 2023  1:34 PM Donee H wrote: Reason for CRM: Patient calling to get results from recent MRI that was done July 21, 2023. Patient states he still haven't been notified of results and would like to know if he needs surgery. Please give call back at [phone]

## 2023-08-06 ENCOUNTER — Telehealth: Payer: Self-pay

## 2023-08-06 ENCOUNTER — Other Ambulatory Visit: Payer: Self-pay

## 2023-08-06 ENCOUNTER — Ambulatory Visit: Payer: Self-pay | Admitting: Family Medicine

## 2023-08-06 DIAGNOSIS — M4802 Spinal stenosis, cervical region: Secondary | ICD-10-CM

## 2023-08-06 NOTE — Addendum Note (Signed)
 Addended by: ERIKA ELIDA RAMAN on: 08/06/2023 04:13 PM   Modules accepted: Orders

## 2023-08-06 NOTE — Telephone Encounter (Signed)
 Copied from CRM [id number]. Topic: Clinical - Lab/Test Results >> Aug 05, 2023  1:34 PM Donee H wrote: Reason for CRM: Patient calling to get results from recent MRI that was done July 21, 2023. Patient states he still haven't been notified of results and would like to know if he needs surgery. Please give call back at [phone] >> Aug 06, 2023 12:33 PM Powell HERO wrote: Patient calling to follow up. Mad he has not gotten his results. Believes it is E2C2 fault. I advised him the message from yesterday was relayed he will be called when the results are available. He stated he will just have to find a new doctor and hung up.

## 2023-08-06 NOTE — Telephone Encounter (Signed)
 Called the reading room to get a reading.

## 2023-08-10 ENCOUNTER — Telehealth: Payer: Self-pay | Admitting: Family Medicine

## 2023-08-10 ENCOUNTER — Encounter: Payer: Self-pay | Admitting: Family Medicine

## 2023-08-10 NOTE — Telephone Encounter (Signed)
 Copied from CRM [id number]. Topic: Referral - Status >> Aug 10, 2023 11:33 AM Berwyn MATSU wrote: Reason for CRM: Patient called in requesting an update on referral status for neurosurgery.  Patient is requesting a call back.   May you please assist.

## 2023-08-11 ENCOUNTER — Encounter: Payer: Self-pay | Admitting: Family Medicine

## 2023-08-18 ENCOUNTER — Encounter: Payer: Self-pay | Admitting: Family Medicine

## 2023-08-19 ENCOUNTER — Encounter: Payer: Self-pay | Admitting: Family Medicine

## 2023-08-19 NOTE — Telephone Encounter (Signed)
 Addressed in separate encounter.

## 2023-09-07 DIAGNOSIS — M47812 Spondylosis without myelopathy or radiculopathy, cervical region: Secondary | ICD-10-CM | POA: Diagnosis not present

## 2023-09-07 DIAGNOSIS — Z6831 Body mass index (BMI) 31.0-31.9, adult: Secondary | ICD-10-CM | POA: Diagnosis not present

## 2023-09-07 DIAGNOSIS — M4712 Other spondylosis with myelopathy, cervical region: Secondary | ICD-10-CM | POA: Diagnosis not present

## 2023-09-07 DIAGNOSIS — M4802 Spinal stenosis, cervical region: Secondary | ICD-10-CM | POA: Diagnosis not present

## 2023-09-07 DIAGNOSIS — M542 Cervicalgia: Secondary | ICD-10-CM | POA: Diagnosis not present

## 2023-09-16 ENCOUNTER — Telehealth: Payer: Self-pay | Admitting: Internal Medicine

## 2023-09-16 MED ORDER — EZETIMIBE 10 MG PO TABS: 10.0000 mg | ORAL_TABLET | Freq: Every day | ORAL | 0 refills | Status: DC

## 2023-09-16 NOTE — Telephone Encounter (Signed)
*  STAT* If patient is at the pharmacy, call can be transferred to refill team.   1. Which medications need to be refilled? (please list name of each medication and dose if known)  ezetimibe  (ZETIA ) 10 MG tablet  2. Which pharmacy/location (including street and city if local pharmacy) is medication to be sent to? [id number] - , [place] - [address]   3. Do they need a 30 day or 90 day supply?  90 DAY SUPPLY

## 2023-09-16 NOTE — Telephone Encounter (Signed)
 RX sent in

## 2023-09-22 DIAGNOSIS — M542 Cervicalgia: Secondary | ICD-10-CM | POA: Diagnosis not present

## 2023-09-28 DIAGNOSIS — M542 Cervicalgia: Secondary | ICD-10-CM | POA: Diagnosis not present

## 2023-10-06 DIAGNOSIS — M542 Cervicalgia: Secondary | ICD-10-CM | POA: Diagnosis not present

## 2023-10-08 ENCOUNTER — Other Ambulatory Visit: Payer: Self-pay | Admitting: Family Medicine

## 2023-10-08 DIAGNOSIS — G545 Neuralgic amyotrophy: Secondary | ICD-10-CM

## 2023-10-08 DIAGNOSIS — G250 Essential tremor: Secondary | ICD-10-CM

## 2023-10-13 DIAGNOSIS — M542 Cervicalgia: Secondary | ICD-10-CM | POA: Diagnosis not present

## 2023-10-20 DIAGNOSIS — M542 Cervicalgia: Secondary | ICD-10-CM | POA: Diagnosis not present

## 2023-10-27 DIAGNOSIS — M542 Cervicalgia: Secondary | ICD-10-CM | POA: Diagnosis not present

## 2023-11-09 DIAGNOSIS — Z6830 Body mass index (BMI) 30.0-30.9, adult: Secondary | ICD-10-CM | POA: Diagnosis not present

## 2023-11-09 DIAGNOSIS — M4712 Other spondylosis with myelopathy, cervical region: Secondary | ICD-10-CM | POA: Diagnosis not present

## 2023-11-09 DIAGNOSIS — M25512 Pain in left shoulder: Secondary | ICD-10-CM | POA: Diagnosis not present

## 2023-11-15 ENCOUNTER — Other Ambulatory Visit: Payer: Self-pay | Admitting: Internal Medicine

## 2023-11-19 DIAGNOSIS — M75102 Unspecified rotator cuff tear or rupture of left shoulder, not specified as traumatic: Secondary | ICD-10-CM | POA: Diagnosis not present

## 2023-11-19 DIAGNOSIS — G8929 Other chronic pain: Secondary | ICD-10-CM | POA: Insufficient documentation

## 2023-11-19 DIAGNOSIS — M25512 Pain in left shoulder: Secondary | ICD-10-CM | POA: Diagnosis not present

## 2023-11-19 DIAGNOSIS — M12812 Other specific arthropathies, not elsewhere classified, left shoulder: Secondary | ICD-10-CM | POA: Diagnosis not present

## 2023-11-19 DIAGNOSIS — M75112 Incomplete rotator cuff tear or rupture of left shoulder, not specified as traumatic: Secondary | ICD-10-CM | POA: Diagnosis not present

## 2023-11-25 ENCOUNTER — Other Ambulatory Visit: Payer: Self-pay | Admitting: Orthopaedic Surgery

## 2023-11-25 DIAGNOSIS — M12812 Other specific arthropathies, not elsewhere classified, left shoulder: Secondary | ICD-10-CM

## 2023-11-30 ENCOUNTER — Encounter (HOSPITAL_BASED_OUTPATIENT_CLINIC_OR_DEPARTMENT_OTHER): Payer: Self-pay

## 2023-12-01 ENCOUNTER — Ambulatory Visit
Admission: RE | Admit: 2023-12-01 | Discharge: 2023-12-01 | Disposition: A | Discharge status: Home / Self Care | Source: Ambulatory Visit | Attending: Orthopaedic Surgery | Admitting: Orthopaedic Surgery

## 2023-12-01 DIAGNOSIS — M12812 Other specific arthropathies, not elsewhere classified, left shoulder: Secondary | ICD-10-CM

## 2023-12-01 DIAGNOSIS — M75102 Unspecified rotator cuff tear or rupture of left shoulder, not specified as traumatic: Secondary | ICD-10-CM

## 2023-12-01 DIAGNOSIS — M19012 Primary osteoarthritis, left shoulder: Secondary | ICD-10-CM | POA: Diagnosis not present

## 2023-12-01 DIAGNOSIS — Z01818 Encounter for other preprocedural examination: Secondary | ICD-10-CM | POA: Diagnosis not present

## 2023-12-02 ENCOUNTER — Ambulatory Visit: Attending: Internal Medicine | Admitting: Internal Medicine

## 2023-12-02 VITALS — BP 130/68 | HR 62 | Ht 68.0 in | Wt 198.0 lb

## 2023-12-02 DIAGNOSIS — I25118 Atherosclerotic heart disease of native coronary artery with other forms of angina pectoris: Secondary | ICD-10-CM | POA: Diagnosis not present

## 2023-12-02 DIAGNOSIS — R0602 Shortness of breath: Secondary | ICD-10-CM

## 2023-12-02 DIAGNOSIS — I340 Nonrheumatic mitral (valve) insufficiency: Secondary | ICD-10-CM | POA: Diagnosis not present

## 2023-12-02 NOTE — Progress Notes (Signed)
 Cardiology Office Note:    Date:  12/02/2023   ID:  Lynwood GORMAN Watt Mickey., DOB 03-21-1950, MRN [id number]  PCP:  Duanne Butler DASEN, MD   [facility] Providers Cardiologist:  Stanly DELENA Leavens, MD     Referring MD: Duanne Butler DASEN, MD   CC: CAD f/u  History of Present Illness:    Judge Duque. is a 73 y.o. 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 In 2022 had cath with OM2 disease that was too small for intervention. Statin myalgias.  Caled 06/18/21 with SOB with lasix  non adherence. 2023:: back in the garden and doing E biking with rare chest pressure. 2024: Asymptomatic.  Denny Mccree. is a 73 year old male with coronary artery disease who presents for discussion of his condition and potential surgery.  He has a history of coronary artery disease with a blockage in the OM2 vessel that is too small for surgical intervention or stenting. He experiences occasional 'weird feelings' but no significant chest pain. He has had shortness of breath in the past, particularly when not on diuretic therapy, but currently denies significant shortness of breath. He manages his hyperlipidemia with diet due to statin myalgias, although he has difficulty adhering to the Mediterranean diet due to personal food preferences.  He has mild mitral regurgitation and mitral annular calcification. He previously experienced shortness of breath and lower extremity edema, prompting plans for a repeat echocardiogram. He remains active with yard work and gardening, though he notes a decrease in speed. He reports no significant leg swelling and states that Lasix  works well for him when he takes it.  He is considering shoulder surgery, having had a previous shoulder replacement 10-12 years ago. He experiences shoulder pain and has been under the care of a neurosurgeon, Dr. Mavis, for therapy, which has aggravated his shoulder.  Recent MRI and CT scans were performed to assess his shoulder condition.   Past Medical History:  Diagnosis Date   Arthritis    Brachial neuritis    Gastric ulcer    GI bleed    H pylori ulcer    Hypercholesteremia    Turner syndrome    left arm, some numbness in fingers    Past Surgical History:  Procedure Laterality Date   COLONOSCOPY WITH PROPOFOL  N/A 03/05/2015   Procedure: COLONOSCOPY WITH PROPOFOL ;  Surgeon: Gladis MARLA Louder, MD;  Location: [facility];  Service: Endoscopy;  Laterality: N/A;   ESOPHAGOGASTRODUODENOSCOPY  09/08/2011   Procedure: ESOPHAGOGASTRODUODENOSCOPY (EGD);  Surgeon: Lynwood LITTIE Celestia Mickey., MD;  Location: [facility];  Service: Endoscopy;  Laterality: N/A;   HERNIA REPAIR     INGUINAL HERNIA REPAIR Right 08/30/2013   Procedure: HERNIA REPAIR INGUINAL WITH MESH  ADULT;  Surgeon: Donnice KATHEE Gladis, MD;  Location: [facility];  Service: General;  Laterality: Right;   LEFT HEART CATH AND CORONARY ANGIOGRAPHY N/A 08/19/2020   Procedure: LEFT HEART CATH AND CORONARY ANGIOGRAPHY;  Surgeon: [place], Peter M, MD;  Location: [facility]IVE CV LAB;  Service: Cardiovascular;  Laterality: N/A;   SHOULDER ARTHROSCOPY  2011   x4  one open rt, rotator cuff tear, Dr. Camella   TOTAL SHOULDER ARTHROPLASTY  01/30/2011   Procedure: TOTAL SHOULDER ARTHROPLASTY;  Surgeon: Elspeth JONELLE Her;  Location: [facility];  Service: Orthopedics;  Laterality: Right;  right total shoulder atrhoplasty    Current Medications: Current Meds  Medication Sig   acetaminophen  (  TYLENOL ) 650 MG CR tablet Take 650 mg by mouth every 8 (eight) hours as needed for pain.   aspirin  EC 81 MG tablet Take 81 mg by mouth daily. Swallow whole.   atorvastatin  (LIPITOR) 80 MG tablet Take 1 tablet (80 mg total) by mouth daily.   diphenhydrAMINE (BENADRYL) 25 MG tablet Take 25 mg by mouth 2 (two) times daily. (Patient taking differently: Take 25 mg by mouth at bedtime as needed.)   ezetimibe  (ZETIA ) 10 MG tablet Take 1 tablet (10  mg total) by mouth daily.   fish oil-omega-3 fatty acids  1000 MG capsule Take 2 g by mouth daily.   furosemide  (LASIX ) 20 MG tablet Take 1 tablet (20 mg total) by mouth daily as needed. (Patient taking differently: Take 20 mg by mouth daily.)   metoprolol  tartrate (LOPRESSOR ) 25 MG tablet Take 0.5 tablets (12.5 mg total) by mouth 2 (two) times daily.   Multiple Vitamin (MULTIVITAMIN WITH MINERALS) TABS tablet Take 1 tablet by mouth daily.   nitroGLYCERIN  (NITROSTAT ) 0.4 MG SL tablet PLACE 1 TABLET UNDER THE TONGUE EVERY 5 MINUTES AS NEEDED FOR CHEST PAIN YOU CAN TAKE UP TO 3 TABLET   pantoprazole  (PROTONIX ) 40 MG tablet Take 1 tablet (40 mg total) by mouth daily. TAKE 1 TABLET BY MOUTH TWICE A DAY BEFORE A MEAL   primidone  (MYSOLINE ) 50 MG tablet TAKE 2 TABLETS BY MOUTH AT BEDTIME   tamsulosin  (FLOMAX ) 0.4 MG CAPS capsule TAKE 1 CAPSULE BY MOUTH EVERY DAY   TURMERIC PO Take 1 capsule by mouth daily.   vitamin C  (ASCORBIC ACID ) 500 MG tablet Take 500 mg by mouth daily.   Zinc 50 MG TABS Take 50 mg by mouth daily.     Allergies:   Bismuth subsalicylate, Monosodium glutamate, Latex, Tyloxapol, and Zoster vac recomb adjuvanted   Social History   Socioeconomic History   Marital status: Married    Spouse name: Not on file   Number of children: Not on file   Years of education: Not on file   Highest education level: Associate degree: occupational, Scientist, product/process development, or vocational program  Occupational History   Not on file  Tobacco Use   Smoking status: Never   Smokeless tobacco: Never  Vaping Use   Vaping status: Never Used  Substance and Sexual Activity   Alcohol use: No   Drug use: No   Sexual activity: Yes  Other Topics Concern   Not on file  Social History Narrative   Not on file   Social Drivers of Health   Financial Resource Strain: Low Risk  (05/09/2023)   Overall Financial Resource Strain (CARDIA)    Difficulty of Paying Living Expenses: Not hard at all  Food Insecurity: No Food  Insecurity (05/09/2023)   Hunger Vital Sign    Worried About Running Out of Food in the Last Year: Never true    Ran Out of Food in the Last Year: Never true  Transportation Needs: No Transportation Needs (05/09/2023)   PRAPARE - Administrator, Civil Service (Medical): No    Lack of Transportation (Non-Medical): No  Physical Activity: Insufficiently Active (05/09/2023)   Exercise Vital Sign    Days of Exercise per Week: 2 days    Minutes of Exercise per Session: 60 min  Stress: Stress Concern Present (05/09/2023)   [employer] of Occupational Health - Occupational Stress Questionnaire    Feeling of Stress : To some extent  Social Connections: Socially Integrated (05/09/2023)   Social Connection and  Isolation Panel    Frequency of Communication with Friends and Family: More than three times a week    Frequency of Social Gatherings with Friends and Family: More than three times a week    Attends Religious Services: More than 4 times per year    Active Member of [employer] or Organizations: Yes    Attends Engineer, structural: More than 4 times per year    Marital Status: Married    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 History of coronary artery disease notable for father with multiple bypass surgeries, mother, uncle, and grandfather.  ROS:   Please see the history of present illness.     EKGs/Labs/Other Studies Reviewed:     Cardiac Studies & Procedures   ______________________________________________________________________________________________ CARDIAC CATHETERIZATION  CARDIAC CATHETERIZATION 08/19/2020  Conclusion  Prox RCA to Mid RCA lesion is 60% stenosed.  Ost RCA lesion is 50% stenosed.  Dist LM to Ost LAD lesion is 25% stenosed.  2nd Mrg lesion is 80% stenosed.  LPAV lesion is 40% stenosed with 40% stenosed side branch  in LPDA.  LV end diastolic pressure is mildly elevated.  1. Nonobstructive CAD except for a tiny second OM 2. Mildly elevated LVEDP  Plan: medical management.  Findings Coronary Findings Diagnostic  Dominance: Left  Left Main Dist LM to Ost LAD lesion is 25% stenosed. The lesion is severely calcified.  Left Circumflex The vessel is moderately ectatic.  Second Obtuse Marginal Branch Vessel is small in size. 2nd Mrg lesion is 80% stenosed.  Left Posterior Atrioventricular Artery LPAV lesion is 40% stenosed with 40% stenosed side branch in LPDA.  Right Coronary Artery Vessel is small. The vessel is mildly ectatic. Ost RCA lesion is 50% stenosed. Prox RCA to Mid RCA lesion is 60% stenosed.  Intervention  No interventions have been documented.     ECHOCARDIOGRAM  ECHOCARDIOGRAM COMPLETE 06/12/2020  Narrative ECHOCARDIOGRAM REPORT    Patient Name:   Hamed Debella. Date of Exam: 06/12/2020 Medical Rec #:  [id number]         Height:       67.0 in Accession #:    [id number]        Weight:       200.0 lb Date of Birth:  07/11/1950         BSA:          2.022 m Patient Age:    70 years          BP:           120/64 mmHg Patient Gender: M                 HR:           72 bpm. Exam Location:  [address]  Procedure: 2D Echo, Strain Analysis, Intracardiac Opacification Agent and Color Doppler  Indications:    R06.00 Dyspnea  History:        Patient has no prior history of Echocardiogram examinations. Signs/Symptoms:Dyspnea; Risk Factors:Dyslipidemia. [place]-Turner sydrome.  Sonographer:    Nolon Berg BA, RDCS Referring Phys: [id number] BUTLER DASEN [facility]   Sonographer Comments: Technically difficult study due to poor echo windows. Lung attenutation.  Comparison(s): No prior Echocardiogram. Summary  1. Left ventricular ejection fraction, by estimation, is 50 to 55%. The left ventricle has low normal function. The left ventricle has no regional wall motion  abnormalities.  Definity  contrast agent was given IV to delineate the left ventricular endocardial borders. The left ventricular internal cavity size was normal in size. There is no left ventricular hypertrophy. Left ventricular diastolic parameters are consistent with Grade I diastolic dysfunction (impaired relaxation). 2. The right ventricular size is normal. No increase in right ventricular wall thickness. Right ventricular systolic function is normal. There is normal pulmonary artery systolic pressure. The tricuspid regurgitant velocity is 2.29 m/s, and with an assumed right atrial pressure of 8 mmHg, the estimated right ventricular systolic pressure is 29.0 mmHg. 3. The mitral valve is abnormal. Mild to moderate mitral annular calcification. Trivial mitral valve regurgitation. No evidence of mitral valve stenosis. 4. The aortic valve was not well visualized. Aortic valve regurgitation is not visualized. No aortic stenosis is present. 5. Prominent Chiari network seen in the subcostal.  FINDINGS Left Ventricle: Left ventricular ejection fraction, by estimation, is 50 to 55%. The left ventricle has low normal function. The left ventricle has no regional wall motion abnormalities. Definity  contrast agent was given IV to delineate the left ventricular endocardial borders. The left ventricular internal cavity size was normal in size. There is no left ventricular hypertrophy. Left ventricular diastolic parameters are consistent with Grade I diastolic dysfunction (impaired relaxation).  Right Ventricle: The right ventricular size is normal. No increase in right ventricular wall thickness. Right ventricular systolic function is normal. There is normal pulmonary artery systolic pressure. The tricuspid regurgitant velocity is 2.29 m/s, and with an assumed right atrial pressure of 8 mmHg, the estimated right ventricular systolic pressure is 29.0 mmHg.  Left Atrium: Left atrial size was normal in  size.  Right Atrium: Right atrial size was normal in size. Prominent Chiari network.  Pericardium: There is no evidence of pericardial effusion.  Mitral Valve: The mitral valve is abnormal. Mild to moderate mitral annular calcification. Trivial mitral valve regurgitation. No evidence of mitral valve stenosis.  Tricuspid Valve: The tricuspid valve is grossly normal. Tricuspid valve regurgitation is trivial. No evidence of tricuspid stenosis.  Aortic Valve: The aortic valve was not well visualized. Aortic valve regurgitation is not visualized. No aortic stenosis is present.  Pulmonic Valve: The pulmonic valve was not well visualized. Pulmonic valve regurgitation is not visualized. No evidence of pulmonic stenosis.  Aorta: The aortic root was not well visualized and the ascending aorta was not well visualized.  Venous: The inferior vena cava is dilated in size with greater than 50% respiratory variability, suggesting right atrial pressure of 8 mmHg.  IAS/Shunts: The atrial septum is grossly normal.   LEFT VENTRICLE PLAX 2D LVOT diam:     2.00 cm  Diastology LV SV:         73       LV e' medial:    5.87 cm/s LV SV Index:   36       LV E/e' medial:  12.1 LVOT Area:     3.14 cm LV e' lateral:   5.87 cm/s LV E/e' lateral: 12.1   RIGHT VENTRICLE            IVC RV Basal diam:  2.90 cm    IVC diam: 2.50 cm RVSP:           29.0 mmHg  LEFT ATRIUM             Index       RIGHT ATRIUM           Index LA Vol (A2C):   51.5 ml 25.47 ml/m  RA Pressure: 8.00 mmHg LA Vol (A4C):   68.0 ml 33.63 ml/m RA Area:     11.00 cm LA Biplane Vol: 59.7 ml 29.52 ml/m RA Volume:   20.60 ml  10.19 ml/m AORTIC VALVE LVOT Vmax:   103.00 cm/s LVOT Vmean:  72.800 cm/s LVOT VTI:    0.233 m  MITRAL VALVE                TRICUSPID VALVE MV Area (PHT): 4.36 cm     TR Peak grad:   21.0 mmHg MV Decel Time: 174 msec     TR Vmax:        229.00 cm/s MV E velocity: 71.10 cm/s   Estimated RAP:  8.00 mmHg MV A  velocity: 104.00 cm/s  RVSP:           29.0 mmHg MV E/A ratio:  0.68 SHUNTS Systemic VTI:  0.23 m Systemic Diam: 2.00 cm  Stanly Leavens MD Electronically signed by Stanly Leavens MD Signature Date/Time: 06/12/2020/8:36:18 AM    Final          ______________________________________________________________________________________________        Recent Labs: 01/21/2023: TSH 1.26 05/10/2023: ALT 25; BUN 21; Creat 1.10; Hemoglobin 13.7; Platelets 267; Potassium 4.7; Sodium 139  Recent Lipid Panel    Component Value Date/Time   CHOL 122 05/07/2023 1026   CHOL 132 04/11/2021 1001   TRIG 138 05/07/2023 1026   HDL 46 05/07/2023 1026   HDL 33 (L) 04/11/2021 1001   CHOLHDL 2.7 05/07/2023 1026   LDLCALC 54 05/07/2023 1026        Physical Exam:    VS:  BP 130/68   Pulse 62   Ht 5' 8 (1.727 m)   Wt 198 lb (89.8 kg)   BMI 30.11 kg/m     Wt Readings from Last 3 Encounters:  12/02/23 198 lb (89.8 kg)  07/15/23 201 lb (91.2 kg)  05/10/23 204 lb 12.8 oz (92.9 kg)    Gen: No distress   Neck: No JVD Ears: R sided Frank's Sign Cardiac: No Rubs or Gallops, systolic murmur, RRR +2 radial pulses Respiratory: Clear to auscultation bilaterally, normal effort, normal  respiratory rate GI: Soft, nontender, non-distended  MS: No edema;  moves all extremities Integument: Skin feels warm Neuro:  At time of evaluation, alert and oriented to person/place/time/situation  Psych: Normal affect, patient feels Fair     ASSESSMENT/PLAN:     Coronary Artery Disease Aortic atherosclerosis HLD - Coronary artery disease with a known blockage in the OM2 vessel, unsuitable for surgical intervention or stenting. He is asymptomatic with well-controlled blood pressure and cholesterol levels. The risk for non-cardiac surgery is increased, but he previously underwent shoulder surgery without complications. He is well-tuned and active, negating the need for additional preoperative  ischemic stress testing (greater than 4 fMETS) - reasonable to proceed with surgery  DOE Mitral Regurgitation Mild mitral regurgitation with mitral annular calcification. He reports mild shortness of breath  An echocardiogram is planned to assess mitral valve function before surgery, expedited due to his surgical timeline - Order echocardiogram to assess mitral valve function - If echocardiogram is normal, proceed with surgery - If echocardiogram shows significant findings, reassess surgical plan  Hyperlipidemia Hyperlipidemia is well-managed with LDL at goal. He has statin myalgias, complicating management, and difficulty adhering to a Mediterranean diet due to personal preferences. - Continue current lipid management - Encourage adherence to dietary recommendations as tolerated (it has been a challenge)  Longitudinal care: The  evaluation and management services provided today reflect the complexity inherent in caring for this patient, including the ongoing longitudinal relationship and management of multiple chronic conditions and/or the need for care coordination. The visit required a comprehensive assessment and management plan tailored to the patient's unique needs Time was spent addressing not only the acute concerns but also the broader context of the patient's health, including preventive care, chronic disease management, and care coordination as appropriate.  Complex longitudinal is necessary for conditions including: medical management of CAD with difficult non-pharmacologic (dietary) options  One year with me or my team unless significant echo findings    Stanly Leavens, MD FASE [facility] Cardiologist [facility]  [address] [place], [place] [address] [phone]  8:29 AM

## 2023-12-02 NOTE — Patient Instructions (Signed)
    Testing/Procedures:  Your physician has requested that you have an echocardiogram. Echocardiography is a painless test that uses sound waves to create images of your heart. It provides your doctor with information about the size and shape of your heart and how well your heart's chambers and valves are working. This procedure takes approximately one hour. There are no restrictions for this procedure. Please do NOT wear cologne, perfume, aftershave, or lotions (deodorant is allowed). Please arrive 15 minutes prior to your appointment time.  Please note: We ask at that you not bring children with you during ultrasound (echo/ vascular) testing. Due to room size and safety concerns, children are not allowed in the ultrasound rooms during exams. Our front office staff cannot provide observation of children in our lobby area while testing is being conducted. An adult accompanying a patient to their appointment will only be allowed in the ultrasound room at the discretion of the ultrasound technician under special circumstances. We apologize for any inconvenience. [address]  Follow-Up: At [facility], you and your health needs are our priority.  As part of our continuing mission to provide you with exceptional heart care, our providers are all part of one team.  This team includes your primary Cardiologist (physician) and Advanced Practice Providers or APPs (Physician Assistants and Nurse Practitioners) who all work together to provide you with the care you need, when you need it.  Your next appointment:   12 month(s)  Provider:   Stanly DELENA Leavens, MD

## 2023-12-10 DIAGNOSIS — Z6830 Body mass index (BMI) 30.0-30.9, adult: Secondary | ICD-10-CM | POA: Diagnosis not present

## 2023-12-10 DIAGNOSIS — M25512 Pain in left shoulder: Secondary | ICD-10-CM | POA: Diagnosis not present

## 2023-12-10 DIAGNOSIS — M4712 Other spondylosis with myelopathy, cervical region: Secondary | ICD-10-CM | POA: Diagnosis not present

## 2023-12-14 ENCOUNTER — Other Ambulatory Visit: Payer: Self-pay | Admitting: Internal Medicine

## 2023-12-15 ENCOUNTER — Ambulatory Visit (HOSPITAL_BASED_OUTPATIENT_CLINIC_OR_DEPARTMENT_OTHER)
Admission: RE | Admit: 2023-12-15 | Discharge: 2023-12-15 | Disposition: A | Discharge status: Home / Self Care | Source: Ambulatory Visit | Attending: Internal Medicine | Admitting: Internal Medicine

## 2023-12-15 DIAGNOSIS — R0602 Shortness of breath: Secondary | ICD-10-CM | POA: Diagnosis not present

## 2023-12-15 LAB — ECHOCARDIOGRAM COMPLETE
AR max vel: 2.27 cm2
AV Area mean vel: 2.05 cm2
AV Mean grad: 4 mmHg
AV Peak grad: 7.6 mmHg
Ao pk vel: 1.38 m/s
Area-P 1/2: 3.83 cm2
Calc EF: 61.8 %
MV M vel: 2.13 m/s
MV Peak grad: 18.1 mmHg
S' Lateral: 2.2 cm
Single Plane A2C EF: 61.8 %
Single Plane A4C EF: 58.7 %

## 2023-12-17 ENCOUNTER — Ambulatory Visit: Payer: Self-pay | Admitting: *Deleted

## 2023-12-24 ENCOUNTER — Telehealth: Payer: Self-pay | Admitting: Family Medicine

## 2023-12-24 ENCOUNTER — Telehealth: Admitting: Physician Assistant

## 2023-12-24 DIAGNOSIS — M5432 Sciatica, left side: Secondary | ICD-10-CM

## 2023-12-24 DIAGNOSIS — M5431 Sciatica, right side: Secondary | ICD-10-CM

## 2023-12-24 MED ORDER — NAPROXEN 500 MG PO TABS: 500.0000 mg | ORAL_TABLET | Freq: Two times a day (BID) | ORAL | 0 refills | Status: AC

## 2023-12-24 MED ORDER — CYCLOBENZAPRINE HCL 10 MG PO TABS: 5.0000 mg | ORAL_TABLET | Freq: Three times a day (TID) | ORAL | 0 refills | Status: DC | PRN

## 2023-12-24 NOTE — Progress Notes (Signed)

## 2023-12-24 NOTE — Telephone Encounter (Signed)
 Copied from CRM [id number]. Topic: Clinical - Medical Advice >> Dec 24, 2023 10:12 AM Amy B wrote: Reason for CRM: Patient is requesting medical clearance prior to surgery on his left shoulder.  Please call [phone] with any questions.  ////////  Spoke with patient;stated he's unsure if he needs an appointment with Dr. Duanne to be cleared for surgery. Please advise patient at [phone].

## 2023-12-27 ENCOUNTER — Ambulatory Visit (INDEPENDENT_AMBULATORY_CARE_PROVIDER_SITE_OTHER): Admitting: Family Medicine

## 2023-12-27 ENCOUNTER — Encounter: Payer: Self-pay | Admitting: Family Medicine

## 2023-12-27 VITALS — BP 134/72 | HR 63 | Temp 97.6°F | Ht 68.0 in | Wt 202.6 lb

## 2023-12-27 DIAGNOSIS — E7800 Pure hypercholesterolemia, unspecified: Secondary | ICD-10-CM

## 2023-12-27 DIAGNOSIS — M5432 Sciatica, left side: Secondary | ICD-10-CM

## 2023-12-27 DIAGNOSIS — M47812 Spondylosis without myelopathy or radiculopathy, cervical region: Secondary | ICD-10-CM | POA: Insufficient documentation

## 2023-12-27 DIAGNOSIS — M5431 Sciatica, right side: Secondary | ICD-10-CM

## 2023-12-27 DIAGNOSIS — M25519 Pain in unspecified shoulder: Secondary | ICD-10-CM | POA: Insufficient documentation

## 2023-12-27 MED ORDER — PREDNISONE 20 MG PO TABS: ORAL_TABLET | ORAL | 0 refills | Status: AC

## 2023-12-27 MED ORDER — PREDNISONE 20 MG PO TABS
ORAL_TABLET | ORAL | 0 refills | Status: AC
Start: 2023-12-27 — End: ?

## 2023-12-27 MED ORDER — CYCLOBENZAPRINE HCL 10 MG PO TABS
5.0000 mg | ORAL_TABLET | Freq: Three times a day (TID) | ORAL | 0 refills | Status: AC | PRN
Start: 2023-12-27 — End: ?

## 2023-12-27 NOTE — Progress Notes (Signed)
 Subjective:    Patient ID: Richard Arnold., male    DOB: 12-07-1950, 73 y.o.   MRN: [id number]   Patient has a history of cervical radiculopathy.  Saw neurosurgery who recommended physical therapy.  During the course of physical therapy, the patient suffered a tear in his rotator cuff.  Orthopedics is now planning to perform surgery to repair his left shoulder due to left shoulder pain.  Patient is here for preoperative clearance.  Although he has a history of coronary artery disease, he denies any chest pain, shortness of breath, or dyspnea on exertion.  Recently had an echocardiogram that revealed an ejection fraction of 55% and no evidence of valve dysfunction.  He denies any symptoms of angina.  He does have a history of a GI bleed.  He is due to recheck his hemoglobin.  He is also due to recheck fasting lab work to monitor his cholesterol given his history of coronary artery disease. Patient also complains of 2 weeks of low back pain.  The pain radiates into his right posterior hip and down his right leg below his right knee.  He also complains of some weakness in his right  leg.  He states it is getting harder and harder for him to stand up from a seated position. Past Medical History:  Diagnosis Date   Arthritis    Brachial neuritis    Gastric ulcer    GI bleed    H pylori ulcer    Hypercholesteremia    Turner syndrome    left arm, some numbness in fingers   Past Surgical History:  Procedure Laterality Date   COLONOSCOPY WITH PROPOFOL  N/A 03/05/2015   Procedure: COLONOSCOPY WITH PROPOFOL ;  Surgeon: Gladis MARLA Louder, MD;  Location: [facility];  Service: Endoscopy;  Laterality: N/A;   ESOPHAGOGASTRODUODENOSCOPY  09/08/2011   Procedure: ESOPHAGOGASTRODUODENOSCOPY (EGD);  Surgeon: Richard LITTIE [name], MD;  Location: [facility];  Service: Endoscopy;  Laterality: N/A;   HERNIA REPAIR     INGUINAL HERNIA REPAIR Right 08/30/2013   Procedure: HERNIA REPAIR INGUINAL WITH MESH  ADULT;   Surgeon: Donnice KATHEE Gladis, MD;  Location: [facility];  Service: General;  Laterality: Right;   LEFT HEART CATH AND CORONARY ANGIOGRAPHY N/A 08/19/2020   Procedure: LEFT HEART CATH AND CORONARY ANGIOGRAPHY;  Surgeon: Jordan, Peter M, MD;  Location: [facility];  Service: Cardiovascular;  Laterality: N/A;   SHOULDER ARTHROSCOPY  2011   x4  one open rt, rotator cuff tear, Dr. Camella   TOTAL SHOULDER ARTHROPLASTY  01/30/2011   Procedure: TOTAL SHOULDER ARTHROPLASTY;  Surgeon: Elspeth JONELLE Her;  Location: [facility];  Service: Orthopedics;  Laterality: Right;  right total shoulder atrhoplasty   Current Outpatient Medications on File Prior to Visit  Medication Sig Dispense Refill   acetaminophen  (TYLENOL ) 650 MG CR tablet Take 650 mg by mouth every 8 (eight) hours as needed for pain.     aspirin  EC 81 MG tablet Take 81 mg by mouth daily. Swallow whole.     atorvastatin  (LIPITOR) 80 MG tablet Take 1 tablet (80 mg total) by mouth daily. 90 tablet 3   diphenhydrAMINE (BENADRYL) 25 MG tablet Take 25 mg by mouth 2 (two) times daily. (Patient taking differently: Take 25 mg by mouth at bedtime as needed.)     ezetimibe  (ZETIA ) 10 MG tablet TAKE 1 TABLET BY MOUTH EVERY DAY 90 tablet 3   fish oil-omega-3 fatty acids  1000 MG capsule Take 2 g by mouth  daily.     furosemide  (LASIX ) 20 MG tablet Take 1 tablet (20 mg total) by mouth daily as needed. (Patient taking differently: Take 20 mg by mouth daily.) 30 tablet 0   metoprolol  tartrate (LOPRESSOR ) 25 MG tablet Take 0.5 tablets (12.5 mg total) by mouth 2 (two) times daily. 90 tablet 3   Multiple Vitamin (MULTIVITAMIN WITH MINERALS) TABS tablet Take 1 tablet by mouth daily.     nitroGLYCERIN  (NITROSTAT ) 0.4 MG SL tablet PLACE 1 TABLET UNDER THE TONGUE EVERY 5 MINUTES AS NEEDED FOR CHEST PAIN YOU CAN TAKE UP TO 3 TABLET 25 tablet 0   pantoprazole  (PROTONIX ) 40 MG tablet Take 1 tablet (40 mg total) by mouth daily. TAKE 1 TABLET BY MOUTH TWICE A DAY BEFORE A MEAL 90  tablet 3   primidone  (MYSOLINE ) 50 MG tablet TAKE 2 TABLETS BY MOUTH AT BEDTIME 180 tablet 1   tamsulosin  (FLOMAX ) 0.4 MG CAPS capsule TAKE 1 CAPSULE BY MOUTH EVERY DAY 90 capsule 1   TURMERIC PO Take 1 capsule by mouth daily.     vitamin C  (ASCORBIC ACID ) 500 MG tablet Take 500 mg by mouth daily.     Zinc 50 MG TABS Take 50 mg by mouth daily.     naproxen (NAPROSYN) 500 MG tablet Take 1 tablet (500 mg total) by mouth 2 (two) times daily with a meal. (Patient not taking: Reported on 12/27/2023) 30 tablet 0   No current facility-administered medications on file prior to visit.   Allergies  Allergen Reactions   Bismuth Subsalicylate     Other reaction(s): hives   Monosodium Glutamate Swelling    Rash, throat swells   Latex Rash   Tyloxapol Anxiety   Zoster Vac Recomb Adjuvanted Dermatitis   Social History   Socioeconomic History   Marital status: Married    Spouse name: Not on file   Number of children: Not on file   Years of education: Not on file   Highest education level: Some college, no degree  Occupational History   Not on file  Tobacco Use   Smoking status: Never   Smokeless tobacco: Never  Vaping Use   Vaping status: Never Used  Substance and Sexual Activity   Alcohol use: No   Drug use: No   Sexual activity: Yes  Other Topics Concern   Not on file  Social History Narrative   Not on file   Social Drivers of Health   Financial Resource Strain: Low Risk  (12/24/2023)   Overall Financial Resource Strain (CARDIA)    Difficulty of Paying Living Expenses: Not hard at all  Food Insecurity: No Food Insecurity (12/24/2023)   Hunger Vital Sign    Worried About Running Out of Food in the Last Year: Never true    Ran Out of Food in the Last Year: Never true  Transportation Needs: No Transportation Needs (12/24/2023)   PRAPARE - Administrator, Civil Service (Medical): No    Lack of Transportation (Non-Medical): No  Physical Activity: Insufficiently Active  (12/24/2023)   Exercise Vital Sign    Days of Exercise per Week: 2 days    Minutes of Exercise per Session: 20 min  Stress: Stress Concern Present (12/24/2023)   [employer] of Occupational Health - Occupational Stress Questionnaire    Feeling of Stress: To some extent  Social Connections: Socially Integrated (12/24/2023)   Social Connection and Isolation Panel    Frequency of Communication with Friends and Family: More than three times  a week    Frequency of Social Gatherings with Friends and Family: More than three times a week    Attends Religious Services: More than 4 times per year    Active Member of [employer] or Organizations: Yes    Attends Engineer, Structural: More than 4 times per year    Marital Status: Married  Catering Manager Violence: Not At Risk (05/06/2023)   Humiliation, Afraid, Rape, and Kick questionnaire    Fear of Current or Ex-Partner: No    Emotionally Abused: No    Physically Abused: No    Sexually Abused: No   Family History  Problem Relation Age of Onset   Heart disease Mother    Heart disease Father    Family history is significant for a history of CABG in his father later in life   Review of Systems  All other systems reviewed and are negative.      Objective:   Physical Exam Vitals reviewed.  Constitutional:      General: He is not in acute distress.    Appearance: He is well-developed. He is not diaphoretic.  HENT:     Head: Normocephalic and atraumatic.     Right Ear: External ear normal.     Left Ear: External ear normal.     Nose: Nose normal.     Mouth/Throat:     Pharynx: No oropharyngeal exudate.  Eyes:     General: No scleral icterus.       Right eye: No discharge.        Left eye: No discharge.     Conjunctiva/sclera: Conjunctivae normal.     Pupils: Pupils are equal, round, and reactive to light.  Neck:     Thyroid : No thyromegaly.     Vascular: No JVD.     Trachea: No tracheal deviation.  Cardiovascular:      Rate and Rhythm: Normal rate and regular rhythm.     Heart sounds: Normal heart sounds. No murmur heard.    No friction rub. No gallop.  Pulmonary:     Effort: Pulmonary effort is normal. No respiratory distress.     Breath sounds: Normal breath sounds. No stridor. No wheezing or rales.  Chest:     Chest wall: No tenderness.  Abdominal:     General: Bowel sounds are normal. There is no distension.     Palpations: Abdomen is soft. There is no mass.     Tenderness: There is no abdominal tenderness. There is no guarding or rebound.  Musculoskeletal:        General: No deformity.     Cervical back: Neck supple. Tenderness present. Pain with movement and muscular tenderness present. No spinous process tenderness. Decreased range of motion.  Lymphadenopathy:     Cervical: No cervical adenopathy.  Skin:    General: Skin is warm.     Coloration: Skin is not pale.     Findings: No erythema or rash.  Neurological:     Mental Status: He is alert and oriented to person, place, and time.     Cranial Nerves: No cranial nerve deficit.     Motor: No abnormal muscle tone.     Coordination: Coordination normal.     Deep Tendon Reflexes: Reflexes are normal and symmetric.  Psychiatric:        Behavior: Behavior normal.        Thought Content: Thought content normal.        Judgment: Judgment normal.  Assessment & Plan:  Pure hypercholesterolemia - Plan: CBC with Differential/Platelet, Comprehensive metabolic panel with GFR, Lipid panel  Bilateral sciatica - Plan: cyclobenzaprine (FLEXERIL) 10 MG tablet  I see no contraindication to surgery to repair the tear in his rotator cuff in his left shoulder.  He denies any symptoms of unstable angina.  He does not have congestive heart failure.  Barring any severe anemia on his CBC, the patient is medically cleared to proceed with left shoulder surgery.  I believe the pain in his back radiating down his right leg is most likely sciatica.  He  is also experiencing it to a less extent going down his left leg.  This has been going on for 2 weeks.  I will treat the patient with a prednisone  taper pack coupled with Flexeril.  If not improving I would recommend an MRI of the lumbar spine.  If there is no evidence of nerve impingement on the MRI of the lumbar spine consider statin myopathy as another potential cause of leg pain and leg weakness

## 2023-12-28 ENCOUNTER — Ambulatory Visit: Payer: Self-pay | Admitting: Family Medicine

## 2023-12-28 LAB — COMPREHENSIVE METABOLIC PANEL WITH GFR
AG Ratio: 1.4 (calc) (ref 1.0–2.5)
ALT: 20 U/L (ref 9–46)
AST: 19 U/L (ref 10–35)
Albumin: 3.9 g/dL (ref 3.6–5.1)
Alkaline phosphatase (APISO): 67 U/L (ref 35–144)
BUN: 18 mg/dL (ref 7–25)
CO2: 29 mmol/L (ref 20–32)
Calcium: 9 mg/dL (ref 8.6–10.3)
Chloride: 102 mmol/L (ref 98–110)
Creat: 0.79 mg/dL (ref 0.70–1.28)
Globulin: 2.8 g/dL (ref 1.9–3.7)
Glucose, Bld: 72 mg/dL (ref 65–99)
Potassium: 4.8 mmol/L (ref 3.5–5.3)
Sodium: 139 mmol/L (ref 135–146)
Total Bilirubin: 0.3 mg/dL (ref 0.2–1.2)
Total Protein: 6.7 g/dL (ref 6.1–8.1)
eGFR: 93 mL/min/1.73m2 (ref >=60)

## 2023-12-28 LAB — CBC WITH DIFFERENTIAL/PLATELET
Absolute Lymphocytes: 2212 {cells}/uL (ref 850–3900)
Absolute Monocytes: 845 {cells}/uL (ref 200–950)
Basophils Absolute: 40 {cells}/uL (ref 0–200)
Basophils Relative: 0.5 %
Eosinophils Absolute: 237 {cells}/uL (ref 15–500)
Eosinophils Relative: 3 %
HCT: 38 % — ABNORMAL LOW (ref 38.5–50.0)
Hemoglobin: 12.6 g/dL — ABNORMAL LOW (ref 13.2–17.1)
MCH: 30.4 pg (ref 27.0–33.0)
MCHC: 33.2 g/dL (ref 32.0–36.0)
MCV: 91.8 fL (ref 80.0–100.0)
MPV: 10.9 fL (ref 7.5–12.5)
Monocytes Relative: 10.7 %
Neutro Abs: 4566 {cells}/uL (ref 1500–7800)
Neutrophils Relative %: 57.8 %
Platelets: 319 Thousand/uL (ref 140–400)
RBC: 4.14 Million/uL — ABNORMAL LOW (ref 4.20–5.80)
RDW: 12.3 % (ref 11.0–15.0)
Total Lymphocyte: 28 %
WBC: 7.9 Thousand/uL (ref 3.8–10.8)

## 2023-12-28 LAB — LIPID PANEL
Cholesterol: 101 mg/dL (ref <200)
HDL: 34 mg/dL — ABNORMAL LOW (ref >=40)
LDL Cholesterol (Calc): 39 mg/dL
Non-HDL Cholesterol (Calc): 67 mg/dL (ref <130)
Total CHOL/HDL Ratio: 3 (calc) (ref <5.0)
Triglycerides: 201 mg/dL — ABNORMAL HIGH (ref <150)

## 2023-12-29 ENCOUNTER — Encounter: Payer: Self-pay | Admitting: Family Medicine

## 2024-01-04 ENCOUNTER — Telehealth: Payer: Self-pay | Admitting: Internal Medicine

## 2024-01-04 ENCOUNTER — Telehealth (HOSPITAL_BASED_OUTPATIENT_CLINIC_OR_DEPARTMENT_OTHER): Payer: Self-pay

## 2024-01-04 ENCOUNTER — Other Ambulatory Visit: Payer: Self-pay | Admitting: Family Medicine

## 2024-01-04 DIAGNOSIS — K921 Melena: Secondary | ICD-10-CM

## 2024-01-04 NOTE — Telephone Encounter (Signed)
 We received patient's cardiac clearance today. Patient has also called today asking about their clearance. They stated that their surgeons office has not received anything. Patient was seen  by Dr. Santo on 12/02/23.He address the clearance at this visit and the patient was cleared. A clearance was not placed prior to this visit.      Pre-operative Risk Assessment    Patient Name: Richard Arnold.  DOB: 12-19-1950 MRN: [id number]   Date of last office visit: 12/02/23 Dr. Santo Date of next office visit: NA  Request for Surgical Clearance    Procedure:  Left Reverse Total Shoulder Arthroplasty  Date of Surgery:  Clearance TBD                                 Surgeon:  Dr. Cristy Surgeon's Group or Practice Name:  [facility] Phone number:  NA Fax number:  [phone]   Type of Clearance Requested:   - Medical  - Pharmacy:  Hold Aspirin  not indicated   Type of Anesthesia:  General  and interscalene block   Additional requests/questions:    SignedAugustin JONETTA Daring   01/04/2024, 11:45 AM

## 2024-01-04 NOTE — Telephone Encounter (Signed)
See notes in chart.

## 2024-01-04 NOTE — Telephone Encounter (Signed)
   Patient Name: Richard Arnold.  DOB: 1950-03-01 MRN: [id number]  Primary Cardiologist: Stanly DELENA Leavens, MD  Chart reviewed as part of pre-operative protocol coverage.  Patient was recently seen in clinic by Dr. Leavens on 12/02/2023, patient was doing well at that time.  Per Dr. Milan notes,  Coronary Artery Disease - Coronary artery disease with a known blockage in the OM2 vessel, unsuitable for surgical intervention or stenting. He is asymptomatic with well-controlled blood pressure and cholesterol levels. The risk for non-cardiac surgery is increased, but he previously underwent shoulder surgery without complications. He is well-tuned and active, negating the need for additional preoperative ischemic stress testing (greater than 4 fMETS) - reasonable to proceed with surgery  Regarding ASA therapy, we recommend continuation of ASA throughout the perioperative period.  However, if the surgeon feels that cessation of ASA is required in the perioperative period, it may be stopped 5-7 days prior to surgery with a plan to resume it as soon as felt to be feasible from a surgical standpoint in the post-operative period.   Given past medical history and time since last visit, based on ACC/AHA guidelines, [name] is at acceptable risk for the planned procedure without further cardiovascular testing.   I will route this recommendation to the requesting party via Epic fax function and remove from pre-op pool.  Please call with questions.  Keerthi Hazell D Christiane Sistare, NP 01/04/2024, 11:58 AM

## 2024-01-04 NOTE — Telephone Encounter (Signed)
 Patient was cleared for surgery at his last appointment with Dr. Santo on 12/02/23 but patient states that Beverley Millman has still not received the cardiac clearance. Please advise  Beverley Millman Dr. Cristy Fax number: [phone]

## 2024-01-05 NOTE — Telephone Encounter (Signed)
 Requested medication (s) are due for refill today: yes  Requested medication (s) are on the active medication list: yes  Last refill:  01/04/24  Future visit scheduled: yes  Notes to clinic:  Pharmacy comment: Script Clarification:WHICH DIRECTIONS??      Requested Prescriptions  Pending Prescriptions Disp Refills   pantoprazole  (PROTONIX ) 40 MG tablet [Pharmacy Med Name: PANTOPRAZOLE  SOD DR 40 MG TAB] 90 tablet 3    Sig: TAKE 1 TABLET (40 MG TOTAL) BY MOUTH DAILY. TAKE 1 TABLET BY MOUTH TWICE A DAY BEFORE A MEAL     Gastroenterology: Proton Pump Inhibitors Passed - 01/05/2024  4:04 PM      Passed - Valid encounter within last 12 months    Recent Outpatient Visits           1 week ago Pure hypercholesterolemia   [place] [facility] Pickard, Butler DASEN, MD   5 months ago Cervical radiculopathy   [place] [facility] Duanne Butler DASEN, MD   8 months ago Colon cancer screening   [place] [facility] Duanne Butler DASEN, MD   11 months ago Benign essential HTN   [facility] Duanne, Butler DASEN, MD   1 year ago Injection site reaction, initial encounter   [facility] Kayla Jeoffrey RAMAN, FNP

## 2024-01-11 ENCOUNTER — Other Ambulatory Visit: Payer: Self-pay | Admitting: Family Medicine

## 2024-01-11 DIAGNOSIS — K921 Melena: Secondary | ICD-10-CM

## 2024-01-11 MED ORDER — PANTOPRAZOLE SODIUM 40 MG PO TBEC: 40.0000 mg | DELAYED_RELEASE_TABLET | Freq: Every day | ORAL | 3 refills | Status: AC

## 2024-01-12 ENCOUNTER — Encounter: Payer: Self-pay | Admitting: Family Medicine

## 2024-01-12 ENCOUNTER — Telehealth: Payer: Self-pay

## 2024-01-12 NOTE — Telephone Encounter (Signed)
 Copied from CRM #[id number]. Topic: Clinical - Medical Advice >> Jan 12, 2024 12:19 PM Willma R wrote: Reason for CRM: Joen from [facility]- Dr. Cristy calling to get clearance for patient having shoulder surgery. Patient was in on 11/17 for pre-op but Joen states they have not received anything. They sent an original request through fax on 11/19/23.  Joen can be reached at [phone] Fax: [phone] ATTN: Joen

## 2024-02-07 ENCOUNTER — Telehealth: Payer: Self-pay

## 2024-02-07 ENCOUNTER — Other Ambulatory Visit: Payer: Self-pay

## 2024-02-07 DIAGNOSIS — E7800 Pure hypercholesterolemia, unspecified: Secondary | ICD-10-CM

## 2024-02-07 DIAGNOSIS — I2510 Atherosclerotic heart disease of native coronary artery without angina pectoris: Secondary | ICD-10-CM

## 2024-02-07 MED ORDER — ATORVASTATIN CALCIUM 80 MG PO TABS: 80.0000 mg | ORAL_TABLET | Freq: Every day | ORAL | 3 refills | Status: AC

## 2024-02-07 NOTE — Telephone Encounter (Signed)
 Prescription Request  02/07/2024  LOV: 12/27/23  What is the name of the medication or equipment? atorvastatin  (LIPITOR) 80 MG tablet [[id number]]   Have you contacted your pharmacy to request a refill? Yes   Which pharmacy would you like this sent to?  [id number] - [place], [place] - [address] AT [facility] [address] [place] [address] Phone: [phone] Fax: [phone]    Patient notified that their request is being sent to the clinical staff for review and that they should receive a response within 2 business days.   Please advise at [facility] [phone]

## 2024-02-07 NOTE — Telephone Encounter (Signed)
 Sent in medication

## 2024-02-13 ENCOUNTER — Encounter: Payer: Self-pay | Admitting: Family Medicine

## 2024-02-14 ENCOUNTER — Other Ambulatory Visit: Payer: Self-pay

## 2024-02-14 MED ORDER — TAMSULOSIN HCL 0.4 MG PO CAPS: 0.4000 mg | ORAL_CAPSULE | Freq: Every day | ORAL | 1 refills | Status: AC

## 2024-02-28 ENCOUNTER — Ambulatory Visit: Admitting: Family Medicine

## 2024-02-28 ENCOUNTER — Encounter: Payer: Self-pay | Admitting: Family Medicine

## 2024-02-28 VITALS — BP 135/67 | HR 82 | Temp 97.6°F | Ht 68.0 in | Wt 189.2 lb

## 2024-02-28 DIAGNOSIS — M25511 Pain in right shoulder: Secondary | ICD-10-CM

## 2024-02-28 DIAGNOSIS — G8929 Other chronic pain: Secondary | ICD-10-CM | POA: Insufficient documentation

## 2024-02-28 NOTE — Progress Notes (Signed)
 "  Acute Office Visit  Patient ID: Richard Kendra., male    DOB: [birth year], 74 y.o.   MRN: [id number]  PCP: Duanne Butler DASEN, MD  Chief Complaint  Patient presents with   Acute Visit    Pain in right shoulder moving down right arm after neck surgery. Having trouble moving arm x 4 days      Subjective:     HPI  Right shoulder pain and weakness s/p ACDF C3-4, C4-5, C5-6. Was seen postop 02/22/2024. Has been taking Prednisone  taper since last Tuesday.  Discussed the use of AI scribe software for clinical note transcription with the patient, who gave verbal consent to proceed.  History of Present Illness Richard Arnold. is a 74 year old male with a history of right shoulder replacement who presents with worsening right shoulder pain and weakness following recent neck surgery.  He underwent neck surgery on February 21, 2024, for three compressed discs, which involved the removal of the discs and insertion of spacers. Since the surgery, he has been experiencing increased pain and stiffness in his right shoulder, worsening over the past few days. The pain radiates from the shoulder down the arm, causing difficulty with mobility and performing daily activities.  He has a history of right shoulder replacement performed in 2011 due to a rotator cuff tear. The shoulder has been problematic intermittently since the replacement, but the current pain is more severe than previous episodes. He has not experienced any recent trauma or engaged in activities that could have exacerbated the shoulder pain. He is a patient at [facility] but has not reached out to them regarding this problem.  Post-surgery, he has been sleeping in a recliner due to difficulty breathing when lying flat in bed. He reports experiencing hallucinations when taking cyclobenzaprine , which he has since discontinued, but continues to take hydrocodone  for pain management.  No recent falls, numbness, or tingling in the  affected arm.   Review of Systems  All other systems reviewed and are negative.   Past Medical History:  Diagnosis Date   Arthritis 2020   Hurt   Brachial neuritis    Gastric ulcer    GI bleed    H pylori ulcer    Hypercholesteremia    Turner syndrome    left arm, some numbness in fingers    Past Surgical History:  Procedure Laterality Date   COLONOSCOPY WITH PROPOFOL  N/A 03/05/2015   Procedure: COLONOSCOPY WITH PROPOFOL ;  Surgeon: Gladis MARLA Louder, MD;  Location: [facility];  Service: Endoscopy;  Laterality: N/A;   ESOPHAGOGASTRODUODENOSCOPY  09/08/2011   Procedure: ESOPHAGOGASTRODUODENOSCOPY (EGD);  Surgeon: Lynwood LITTIE Celestia Mickey., MD;  Location: [facility];  Service: Endoscopy;  Laterality: N/A;   HERNIA REPAIR     INGUINAL HERNIA REPAIR Right 08/30/2013   Procedure: HERNIA REPAIR INGUINAL WITH MESH  ADULT;  Surgeon: Donnice KATHEE Gladis, MD;  Location: [facility];  Service: General;  Laterality: Right;   JOINT REPLACEMENT  2010   Wow   LEFT HEART CATH AND CORONARY ANGIOGRAPHY N/A 08/19/2020   Procedure: LEFT HEART CATH AND CORONARY ANGIOGRAPHY;  Surgeon: Jordan, Peter M, MD;  Location: [facility] CV LAB;  Service: Cardiovascular;  Laterality: N/A;   SHOULDER ARTHROSCOPY  2011   x4  one open rt, rotator cuff tear, Dr. Camella   TOTAL SHOULDER ARTHROPLASTY  01/30/2011   Procedure: TOTAL SHOULDER ARTHROPLASTY;  Surgeon: Elspeth JONELLE Her;  Location: [facility];  Service: Orthopedics;  Laterality: Right;  right total shoulder atrhoplasty    Outpatient Medications Prior to Visit  Medication Sig Dispense Refill   acetaminophen  (TYLENOL ) 650 MG CR tablet Take 650 mg by mouth every 8 (eight) hours as needed for pain.     aspirin  EC 81 MG tablet Take 81 mg by mouth daily. Swallow whole.     atorvastatin  (LIPITOR) 80 MG tablet Take 1 tablet (80 mg total) by mouth daily. 90 tablet 3   cyclobenzaprine  (FLEXERIL ) 10 MG tablet Take 0.5-1 tablets (5-10 mg total) by mouth 3 (three) times daily as  needed. 30 tablet 0   diphenhydrAMINE (BENADRYL) 25 MG tablet Take 25 mg by mouth 2 (two) times daily. (Patient taking differently: Take 25 mg by mouth at bedtime as needed.)     ezetimibe  (ZETIA ) 10 MG tablet TAKE 1 TABLET BY MOUTH EVERY DAY 90 tablet 3   fish oil-omega-3 fatty acids  1000 MG capsule Take 2 g by mouth daily.     furosemide  (LASIX ) 20 MG tablet Take 1 tablet (20 mg total) by mouth daily as needed. (Patient taking differently: Take 20 mg by mouth daily.) 30 tablet 0   HYDROcodone -acetaminophen  (NORCO/VICODIN) 5-325 MG tablet Take 1-2 tablets by mouth every 4 (four) hours as needed for severe pain (pain score 7-10).     metoprolol  tartrate (LOPRESSOR ) 25 MG tablet TAKE 0.5 TABLETS BY MOUTH 2 TIMES DAILY. 90 tablet 3   Multiple Vitamin (MULTIVITAMIN WITH MINERALS) TABS tablet Take 1 tablet by mouth daily.     naproxen  (NAPROSYN ) 500 MG tablet Take 1 tablet (500 mg total) by mouth 2 (two) times daily with a meal. 30 tablet 0   nitroGLYCERIN  (NITROSTAT ) 0.4 MG SL tablet PLACE 1 TABLET UNDER THE TONGUE EVERY 5 MINUTES AS NEEDED FOR CHEST PAIN YOU CAN TAKE UP TO 3 TABLET 25 tablet 0   pantoprazole  (PROTONIX ) 40 MG tablet Take 1 tablet (40 mg total) by mouth daily. 90 tablet 3   predniSONE  (DELTASONE ) 20 MG tablet 3 tabs poqday 1-2, 2 tabs poqday 3-4, 1 tab poqday 5-6 12 tablet 0   predniSONE  (DELTASONE ) 20 MG tablet 3 tabs poqday 1-2, 2 tabs poqday 3-4, 1 tab poqday 5-6 12 tablet 0   primidone  (MYSOLINE ) 50 MG tablet TAKE 2 TABLETS BY MOUTH AT BEDTIME 180 tablet 1   tamsulosin  (FLOMAX ) 0.4 MG CAPS capsule Take 1 capsule (0.4 mg total) by mouth daily. 90 capsule 1   TURMERIC PO Take 1 capsule by mouth daily.     vitamin C  (ASCORBIC ACID ) 500 MG tablet Take 500 mg by mouth daily.     Zinc 50 MG TABS Take 50 mg by mouth daily.     methylPREDNISolone (MEDROL DOSEPAK) 4 MG TBPK tablet Take 4 mg by mouth daily as needed.     No facility-administered medications prior to visit.     Allergies[1]     Objective:    BP 135/67   Pulse 82   Temp 97.6 F (36.4 C)   Ht 5' 8 (1.727 m)   Wt 189 lb 3.2 oz (85.8 kg)   SpO2 97%   BMI 28.77 kg/m    Physical Exam Vitals and nursing note reviewed.  Constitutional:      Appearance: Normal appearance. He is normal weight.  HENT:     Head: Normocephalic and atraumatic.  Musculoskeletal:     Right shoulder: Tenderness present. No swelling, deformity, bony tenderness or crepitus. Decreased range of motion. Normal strength. Normal pulse.     Right upper  arm: Normal.     Right elbow: Normal.     Right forearm: Normal.     Right wrist: Normal.     Right hand: Normal.     Cervical back: No tenderness or bony tenderness. No pain with movement.  Skin:    General: Skin is warm and dry.     Capillary Refill: Capillary refill takes less than 2 seconds.  Neurological:     General: No focal deficit present.     Mental Status: He is alert and oriented to person, place, and time. Mental status is at baseline.  Psychiatric:        Mood and Affect: Mood normal.        Behavior: Behavior normal.        Thought Content: Thought content normal.        Judgment: Judgment normal.       No results found for any visits on 02/28/24.     Assessment & Plan:   Problem List Items Addressed This Visit       Other   Chronic right shoulder pain - Primary   Relevant Medications   HYDROcodone -acetaminophen  (NORCO/VICODIN) 5-325 MG tablet   methylPREDNISolone (MEDROL DOSEPAK) 4 MG TBPK tablet    Assessment and Plan Assessment & Plan Right shoulder pain and weakness, status post replacement and rotator cuff tear Worsening symptoms suggest possible inflammation from recent cervical spine surgery or rotator cuff recurrence. RUE active ROM limited to 30 degrees with flexion and 45 degrees with abduction. Passive ROM limited to 90 degrees flexion due to pain.  - Contact orthopedics for evaluation and management. - Notify  neurosurgeon regarding shoulder symptoms. - Continue Prednisone  and muscle relaxer as prescribed.    No orders of the defined types were placed in this encounter.   Return if symptoms worsen or fail to improve.  Richard GORMAN Barrio, FNP [facility]      [1]  Allergies Allergen Reactions   Bismuth Subsalicylate     Other reaction(s): hives   Monosodium Glutamate Swelling    Rash, throat swells   Latex Rash   Tyloxapol Anxiety   Zoster Vac Recomb Adjuvanted Dermatitis   "

## 2024-03-01 ENCOUNTER — Other Ambulatory Visit: Payer: Self-pay | Admitting: Orthopaedic Surgery

## 2024-03-01 DIAGNOSIS — M25511 Pain in right shoulder: Secondary | ICD-10-CM

## 2024-03-04 ENCOUNTER — Encounter: Payer: Self-pay | Admitting: Family Medicine

## 2024-03-09 ENCOUNTER — Ambulatory Visit (INDEPENDENT_AMBULATORY_CARE_PROVIDER_SITE_OTHER): Admitting: Family Medicine

## 2024-03-09 ENCOUNTER — Ambulatory Visit: Payer: Self-pay

## 2024-03-09 VITALS — BP 150/84 | HR 95 | Ht 68.0 in | Wt 181.0 lb

## 2024-03-09 DIAGNOSIS — R0600 Dyspnea, unspecified: Secondary | ICD-10-CM

## 2024-03-09 MED ORDER — DIAZEPAM 5 MG PO TABS: 5.0000 mg | ORAL_TABLET | Freq: Three times a day (TID) | ORAL | 0 refills | Status: AC | PRN

## 2024-03-09 NOTE — Progress Notes (Signed)
 "  Subjective:    Patient ID: Richard Arnold., male    DOB: 07-17-1950, 74 y.o.   MRN: [id number] Patient is dealing with severe anxiety.  Ever since he had surgery on his neck, the patient states he feels like he cannot lay flat in the bed.  He feels like he is suffocating.  He feels like he is unstable and he has to leave the bed.  His wife states that he is getting up and laying down and getting up and laying down throughout the night.  This is keeping him from sleeping.  He will change from chair to chair and unable to find a comfortable position.  Patient's wife is up with him all night long.  He states that he feels short of breath.  He feels anxious.  In the morning, he will take a hot shower and then he will lay down be able to take a nap for an hour or so.  However this has been going on now for approximately 2 weeks.  He denies any chest pain.  He denies any pleurisy.  He denies any hemoptysis.  The patient has no pitting edema in his extremities.  There is no unilateral leg swelling.  He does have some faint right basilar crackles but I believe this is atelectasis as the patient currently has his right arm in a sling and is unable to move his right arm due to pain in his rotator cuff  Past Medical History:  Diagnosis Date   Arthritis 2020   Hurt   Brachial neuritis    Gastric ulcer    GI bleed    H pylori ulcer    Hypercholesteremia    Turner syndrome    left arm, some numbness in fingers   Past Surgical History:  Procedure Laterality Date   COLONOSCOPY WITH PROPOFOL  N/A 03/05/2015   Procedure: COLONOSCOPY WITH PROPOFOL ;  Surgeon: Gladis MARLA Louder, MD;  Location: [facility];  Service: Endoscopy;  Laterality: N/A;   ESOPHAGOGASTRODUODENOSCOPY  09/08/2011   Procedure: ESOPHAGOGASTRODUODENOSCOPY (EGD);  Surgeon: Richard LITTIE [name], MD;  Location: [facility];  Service: Endoscopy;  Laterality: N/A;   HERNIA REPAIR     INGUINAL HERNIA REPAIR Right 08/30/2013   Procedure: HERNIA  REPAIR INGUINAL WITH MESH  ADULT;  Surgeon: Donnice KATHEE Gladis, MD;  Location: [facility];  Service: General;  Laterality: Right;   JOINT REPLACEMENT  2010   Wow   LEFT HEART CATH AND CORONARY ANGIOGRAPHY N/A 08/19/2020   Procedure: LEFT HEART CATH AND CORONARY ANGIOGRAPHY;  Surgeon: Jordan, Peter M, MD;  Location: [facility] INVASIVE CV LAB;  Service: Cardiovascular;  Laterality: N/A;   SHOULDER ARTHROSCOPY  2011   x4  one open rt, rotator cuff tear, Dr. Camella   TOTAL SHOULDER ARTHROPLASTY  01/30/2011   Procedure: TOTAL SHOULDER ARTHROPLASTY;  Surgeon: Elspeth JONELLE Her;  Location: [facility];  Service: Orthopedics;  Laterality: Right;  right total shoulder atrhoplasty   Current Outpatient Medications on File Prior to Visit  Medication Sig Dispense Refill   acetaminophen  (TYLENOL ) 650 MG CR tablet Take 650 mg by mouth every 8 (eight) hours as needed for pain.     aspirin  EC 81 MG tablet Take 81 mg by mouth daily. Swallow whole.     atorvastatin  (LIPITOR) 80 MG tablet Take 1 tablet (80 mg total) by mouth daily. 90 tablet 3   cyclobenzaprine  (FLEXERIL ) 10 MG tablet Take 0.5-1 tablets (5-10 mg total) by mouth 3 (three) times daily as  needed. 30 tablet 0   ezetimibe  (ZETIA ) 10 MG tablet TAKE 1 TABLET BY MOUTH EVERY DAY 90 tablet 3   fish oil-omega-3 fatty acids  1000 MG capsule Take 2 g by mouth daily.     furosemide  (LASIX ) 20 MG tablet Take 1 tablet (20 mg total) by mouth daily as needed. (Patient taking differently: Take 20 mg by mouth daily.) 30 tablet 0   gabapentin (NEURONTIN) 100 MG capsule Take 100 mg by mouth 3 (three) times daily.     metoprolol  tartrate (LOPRESSOR ) 25 MG tablet TAKE 0.5 TABLETS BY MOUTH 2 TIMES DAILY. 90 tablet 3   Multiple Vitamin (MULTIVITAMIN WITH MINERALS) TABS tablet Take 1 tablet by mouth daily.     naproxen  (NAPROSYN ) 500 MG tablet Take 1 tablet (500 mg total) by mouth 2 (two) times daily with a meal. 30 tablet 0   nitroGLYCERIN  (NITROSTAT ) 0.4 MG SL tablet PLACE 1 TABLET UNDER THE  TONGUE EVERY 5 MINUTES AS NEEDED FOR CHEST PAIN YOU CAN TAKE UP TO 3 TABLET 25 tablet 0   oxyCODONE -acetaminophen  (PERCOCET/ROXICET) 5-325 MG tablet Take 2 tablets by mouth daily in the afternoon.     pantoprazole  (PROTONIX ) 40 MG tablet Take 1 tablet (40 mg total) by mouth daily. 90 tablet 3   predniSONE  (DELTASONE ) 20 MG tablet 3 tabs poqday 1-2, 2 tabs poqday 3-4, 1 tab poqday 5-6 12 tablet 0   predniSONE  (DELTASONE ) 20 MG tablet 3 tabs poqday 1-2, 2 tabs poqday 3-4, 1 tab poqday 5-6 12 tablet 0   primidone  (MYSOLINE ) 50 MG tablet TAKE 2 TABLETS BY MOUTH AT BEDTIME 180 tablet 1   tamsulosin  (FLOMAX ) 0.4 MG CAPS capsule Take 1 capsule (0.4 mg total) by mouth daily. 90 capsule 1   TURMERIC PO Take 1 capsule by mouth daily.     vitamin C  (ASCORBIC ACID ) 500 MG tablet Take 500 mg by mouth daily.     Zinc 50 MG TABS Take 50 mg by mouth daily.     diphenhydrAMINE (BENADRYL) 25 MG tablet Take 25 mg by mouth 2 (two) times daily. (Patient taking differently: Take 25 mg by mouth at bedtime as needed.)     HYDROcodone -acetaminophen  (NORCO/VICODIN) 5-325 MG tablet Take 1-2 tablets by mouth every 4 (four) hours as needed for severe pain (pain score 7-10). (Patient not taking: Reported on 03/09/2024)     methylPREDNISolone (MEDROL DOSEPAK) 4 MG TBPK tablet Take 4 mg by mouth daily as needed. (Patient not taking: Reported on 03/09/2024)     No current facility-administered medications on file prior to visit.   Allergies  Allergen Reactions   Bismuth Subsalicylate     Other reaction(s): hives   Monosodium Glutamate Swelling    Rash, throat swells   Latex Rash   Tyloxapol Anxiety   Zoster Vac Recomb Adjuvanted Dermatitis   Social History   Socioeconomic History   Marital status: Married    Spouse name: Not on file   Number of children: Not on file   Years of education: Not on file   Highest education level: Some college, no degree  Occupational History   Not on file  Tobacco Use   Smoking  status: Never   Smokeless tobacco: Never  Vaping Use   Vaping status: Never Used  Substance and Sexual Activity   Alcohol use: No   Drug use: No   Sexual activity: Yes  Other Topics Concern   Not on file  Social History Narrative   Not on file   Social  Drivers of Health   Tobacco Use: Low Risk (02/28/2024)   Patient History    Smoking Tobacco Use: Never    Smokeless Tobacco Use: Never    Passive Exposure: Not on file  Financial Resource Strain: Low Risk (12/24/2023)   Overall Financial Resource Strain (CARDIA)    Difficulty of Paying Living Expenses: Not hard at all  Food Insecurity: No Food Insecurity (12/24/2023)   Epic    Worried About Programme Researcher, Broadcasting/film/video in the Last Year: Never true    Ran Out of Food in the Last Year: Never true  Transportation Needs: No Transportation Needs (12/24/2023)   Epic    Lack of Transportation (Medical): No    Lack of Transportation (Non-Medical): No  Physical Activity: Insufficiently Active (12/24/2023)   Exercise Vital Sign    Days of Exercise per Week: 2 days    Minutes of Exercise per Session: 20 min  Stress: Stress Concern Present (12/24/2023)   [employer] of Occupational Health - Occupational Stress Questionnaire    Feeling of Stress: To some extent  Social Connections: Socially Integrated (12/24/2023)   Social Connection and Isolation Panel    Frequency of Communication with Friends and Family: More than three times a week    Frequency of Social Gatherings with Friends and Family: More than three times a week    Attends Religious Services: More than 4 times per year    Active Member of [employer] or Organizations: Yes    Attends Banker Meetings: More than 4 times per year    Marital Status: Married  Catering Manager Violence: Not At Risk (05/06/2023)   Humiliation, Afraid, Rape, and Kick questionnaire    Fear of Current or Ex-Partner: No    Emotionally Abused: No    Physically Abused: No    Sexually Abused: No   Depression (PHQ2-9): Low Risk (07/15/2023)   Depression (PHQ2-9)    PHQ-2 Score: 0  Alcohol Screen: Low Risk (05/06/2023)   Alcohol Screen    Last Alcohol Screening Score (AUDIT): 0  Housing: Unknown (12/24/2023)   Epic    Unable to Pay for Housing in the Last Year: No    Number of Times Moved in the Last Year: Not on file    Homeless in the Last Year: No  Utilities: Not At Risk (05/06/2023)   [employer] Utilities    Threatened with loss of utilities: No  Health Literacy: Adequate Health Literacy (05/06/2023)   B1300 Health Literacy    Frequency of need for help with medical instructions: Never   Family History  Problem Relation Age of Onset   Heart disease Mother    Heart disease Father    Cancer Father    Family history is significant for a history of CABG in his father later in life   Review of Systems  Respiratory:  Positive for shortness of breath.   Psychiatric/Behavioral:  The patient has insomnia.   All other systems reviewed and are negative.      Objective:   Physical Exam Vitals reviewed.  Constitutional:      General: He is not in acute distress.    Appearance: He is well-developed. He is not diaphoretic.  HENT:     Head: Normocephalic and atraumatic.     Right Ear: External ear normal.     Left Ear: External ear normal.     Nose: Nose normal.     Mouth/Throat:     Pharynx: No oropharyngeal exudate.  Eyes:  General: No scleral icterus.       Right eye: No discharge.        Left eye: No discharge.     Conjunctiva/sclera: Conjunctivae normal.     Pupils: Pupils are equal, round, and reactive to light.  Neck:     Thyroid : No thyromegaly.     Vascular: No JVD.     Trachea: No tracheal deviation.  Cardiovascular:     Rate and Rhythm: Normal rate and regular rhythm.     Heart sounds: Normal heart sounds. No murmur heard.    No friction rub. No gallop.  Pulmonary:     Effort: Pulmonary effort is normal. No respiratory distress.     Breath sounds: No  stridor. Examination of the right-lower field reveals rales. Rales present. No wheezing.  Chest:     Chest wall: No tenderness.  Abdominal:     General: Bowel sounds are normal. There is no distension.     Palpations: Abdomen is soft. There is no mass.     Tenderness: There is no abdominal tenderness. There is no guarding or rebound.  Musculoskeletal:        General: No deformity.     Cervical back: Neck supple. Muscular tenderness present. No spinous process tenderness.  Lymphadenopathy:     Cervical: No cervical adenopathy.  Skin:    General: Skin is warm.     Coloration: Skin is not pale.     Findings: No erythema or rash.  Neurological:     Mental Status: He is alert and oriented to person, place, and time.     Cranial Nerves: No cranial nerve deficit.     Motor: No abnormal muscle tone.     Coordination: Coordination normal.     Deep Tendon Reflexes: Reflexes are normal and symmetric.  Psychiatric:        Behavior: Behavior normal.        Thought Content: Thought content normal.        Judgment: Judgment normal.           Assessment & Plan:  Dyspnea, unspecified type - Plan: DG Chest 2 View Given his dyspnea and abnormal breath sounds, I will obtain a chest x-ray but I believe that this is most likely atelectasis.  Patient seems to be battling severe anxiety.  I recommended that we try Valium  5 mg up to every 8 hours as needed for panic attacks.  I warned the patient that this medication will add to oxycodone  and potentially cause respiratory depression.  Therefore I recommended that they avoid using this medicine with oxycodone .  If the medicine causes him to be extremely lethargic or confused, I recommended that they stop it immediately.  Hopefully he can take this at night and get a good nights rest without anxiety keeping him awake.  We discussed other strategies to help with insomnia other than the medication such as using a fan, white noise, hot showers, music apps on  his phone to create ambient noise.  Recommended stopping gabapentin since the patient does not feel that this is helping his pain and I want to avoid polypharmacy "

## 2024-03-09 NOTE — Telephone Encounter (Signed)
 See patient message 03/04/24, has been speaking with Ronal Bradley LPN  FYI Only or Action Required?: FYI only for provider: appointment scheduled on 03/09/24.  Patient was last seen in primary care on 02/28/2024 by Kayla Jeoffrey RAMAN, FNP.  Called Nurse Triage reporting Shoulder Pain.  Symptoms began several weeks ago.  Interventions attempted: Prescription medications: Gabapentin, prednisone , oxycodone -acetaminophen .  Symptoms are: unchanged.  Triage Disposition: See HCP Within 4 Hours (Or PCP Triage)  Patient/caregiver understands and will follow disposition?: Yes       Message from Antwanette L sent at 03/09/2024  1:59 PM EST  Reason for Triage: The patient had back surgery approximately two and a half weeks ago and is now experiencing shoulder pain and difficulty sleeping. He reports being unable to lie down since the surgery. The patient is requesting medication for nerve-related symptoms   Reason for Disposition  Weakness (i.e., loss of strength) in hand or fingers  (Exception: Not truly weak; hand feels weak because of pain.)  Answer Assessment - Initial Assessment Questions 1. ONSET: When did the pain start?     2 weeks.  2. LOCATION: Where is the pain located?     Both shoulders pain, worse on right  3. PAIN: How bad is the pain? (Scale 1-10; or mild, moderate, severe)     5-6/10  4. WORK OR EXERCISE: Has there been any recent work or exercise that involved this part of the body?     He states he was pulling the latch for his recliner just before the pain started in his right shoulder.  5. CAUSE: What do you think is causing the shoulder pain?     Thinks possibly ruptured rotator cuff on right shoulder, spoke with orthopedic MD about it and states he has a PET scan,  6. OTHER SYMPTOMS: Do you have any other symptoms? (e.g., neck pain, swelling, rash, fever, numbness, weakness)     Felt like he had a panic attack when he tried lying down in bed his first night  home. Insomnia x 2.5 weeks (not pain keeping him up, panic). No previous heart attack, chest pain, passed out/LOC, dark or red colored urine, recent injury to shoulder or falls, fever, swelling in arms  Protocols used: Shoulder Pain-A-AH

## 2024-03-10 ENCOUNTER — Other Ambulatory Visit: Payer: Self-pay

## 2024-03-10 ENCOUNTER — Encounter: Payer: Self-pay | Admitting: Neurology

## 2024-03-10 ENCOUNTER — Ambulatory Visit
Admission: RE | Admit: 2024-03-10 | Discharge: 2024-03-10 | Disposition: A | Source: Ambulatory Visit | Attending: Family Medicine | Admitting: Family Medicine

## 2024-03-10 DIAGNOSIS — R202 Paresthesia of skin: Secondary | ICD-10-CM

## 2024-03-10 DIAGNOSIS — R0600 Dyspnea, unspecified: Secondary | ICD-10-CM

## 2024-03-13 ENCOUNTER — Telehealth: Payer: Self-pay

## 2024-03-13 ENCOUNTER — Ambulatory Visit: Payer: Self-pay | Admitting: Family Medicine

## 2024-03-13 NOTE — Telephone Encounter (Signed)
 Copied from CRM [id number]. Topic: Clinical - Lab/Test Results >> Mar 13, 2024 10:52 AM Robinson H wrote: Reason for CRM: Patient following up on lab results, no message to patient from provider.  [place] [phone]

## 2024-03-13 NOTE — Progress Notes (Signed)
"  Pt informed via mychart  "

## 2024-03-15 ENCOUNTER — Ambulatory Visit
Admission: RE | Admit: 2024-03-15 | Discharge: 2024-03-15 | Disposition: A | Source: Ambulatory Visit | Attending: Orthopaedic Surgery

## 2024-03-15 ENCOUNTER — Inpatient Hospital Stay
Admission: RE | Admit: 2024-03-15 | Discharge: 2024-03-15 | Disposition: A | Source: Ambulatory Visit | Attending: Orthopaedic Surgery

## 2024-03-15 DIAGNOSIS — M25511 Pain in right shoulder: Secondary | ICD-10-CM

## 2024-03-15 MED ORDER — IOPAMIDOL (ISOVUE-M 200) INJECTION 41%
10.0000 mL | Freq: Once | INTRAMUSCULAR | Status: AC
  Administered 2024-03-15: 10 mL via INTRA_ARTICULAR

## 2024-03-16 ENCOUNTER — Encounter: Payer: Self-pay | Admitting: Family Medicine

## 2024-03-16 ENCOUNTER — Ambulatory Visit: Payer: Self-pay

## 2024-03-16 NOTE — Telephone Encounter (Signed)
 Patient is calling in regarding continued shoulder pain and limited mobility. States that he had a CT scan done recently and Dr. Cristy reported no findings on the scan. He denies any new or worsening symptoms compared to last office visit. He's concerned about nerve involvement and would like to speak with PCP about next steps.

## 2024-03-16 NOTE — Telephone Encounter (Signed)
 This encounter was created in error - please disregard.

## 2024-03-16 NOTE — Telephone Encounter (Signed)
Duplicate message. Sent to PCP.

## 2024-03-17 ENCOUNTER — Telehealth: Admitting: Family Medicine

## 2024-03-17 DIAGNOSIS — M5412 Radiculopathy, cervical region: Secondary | ICD-10-CM

## 2024-03-17 NOTE — Progress Notes (Signed)
 "  Subjective:    Patient ID: Richard Arnold., male    DOB: 1950/07/05, 74 y.o.   MRN: [id number]  HPI  Patient is being seen today as a video visit.  Video call began at 1111.  Video call concluded at 1126.  Patient is currently at home.  I am currently in my office.  He consents to be seen via video.  Chief complaint is just shoot me.  Patient has been dealing with pain in his neck and in his right shoulder and right arm now for several months.  He has a history of surgery on his right shoulder.  The patient recently had surgery on his cervical spine in January due to cervical degenerative disc disease and chronic neuropathic pain in his right arm. I have copied his MRI report from earlier last year of his neck below: 1. Multilevel cervical spondylosis with moderate canal stenosis at C3-4 and C4-5. 2. Severe bilateral foraminal stenosis at C3-4 and moderate-severe bilateral foraminal stenosis at C4-5 and C5-6. 3. Reactive subchondral marrow edema associated with the bilateral C3-4 facet joints as well as the right C4-5 and left C5-6 facet joints.  After the surgery, the patient complains of numbness in his right arm.  The numbness extends from his shoulder all the way down to his fingertips.  He also states that he cannot raise his arm.  He states that his right arm is extremely weak.  He is unable to abduct his shoulder greater than 6 inches off the side of his body due to the weakness.  Patient had a CT scan of his right shoulder yesterday under the care of his orthopedist.  IMPRESSION: 1. Right total shoulder arthroplasty a small amount of contrast extends between the bony margin and the posterior inferior lip of the glenoid component of the prosthesis over about 8 mm, although it does not outline the pegs. 2. No acute osseous abnormality related to the reported injury. 3. Thoracic spondylosis. 4. Mild right middle lobe scarring and volume loss. 5. Coronary and aortic  atherosclerosis. 6. Atrophic right serratus anterior muscle. 7. Stomach and a portion of colon within the left chest, which could be due to left hemidiaphragmatic elevation or a large hiatal hernia.  His orthopedic surgeon told him that there was nothing that appeared to be wrong with his shoulder.  Therefore the patient is at a loss as to what to do.  His neurosurgeon has scheduled him for nerve conduction studies however this has not been arranged until late March.  The patient states that he is in pain.  He reports weakness in his arm.  He reports numbness in his arm and he is questioning me as to what to do Past Medical History:  Diagnosis Date   Arthritis 2020   Hurt   Brachial neuritis    Gastric ulcer    GI bleed    H pylori ulcer    Hypercholesteremia    Turner syndrome    left arm, some numbness in fingers   Past Surgical History:  Procedure Laterality Date   COLONOSCOPY WITH PROPOFOL  N/A 03/05/2015   Procedure: COLONOSCOPY WITH PROPOFOL ;  Surgeon: Gladis MARLA Louder, MD;  Location: [facility];  Service: Endoscopy;  Laterality: N/A;   ESOPHAGOGASTRODUODENOSCOPY  09/08/2011   Procedure: ESOPHAGOGASTRODUODENOSCOPY (EGD);  Surgeon: Richard LITTIE [name], MD;  Location: [facility];  Service: Endoscopy;  Laterality: N/A;   HERNIA REPAIR     INGUINAL HERNIA REPAIR Right 08/30/2013   Procedure: HERNIA  REPAIR INGUINAL WITH MESH  ADULT;  Surgeon: Donnice KATHEE Lunger, MD;  Location: [facility];  Service: General;  Laterality: Right;   JOINT REPLACEMENT  2010   Wow   LEFT HEART CATH AND CORONARY ANGIOGRAPHY N/A 08/19/2020   Procedure: LEFT HEART CATH AND CORONARY ANGIOGRAPHY;  Surgeon: Jordan, Peter M, MD;  Location: [facility];  Service: Cardiovascular;  Laterality: N/A;   SHOULDER ARTHROSCOPY  2011   x4  one open rt, rotator cuff tear, Dr. Camella   TOTAL SHOULDER ARTHROPLASTY  01/30/2011   Procedure: TOTAL SHOULDER ARTHROPLASTY;  Surgeon: Elspeth JONELLE Her;  Location: [facility];   Service: Orthopedics;  Laterality: Right;  right total shoulder atrhoplasty   Medications Ordered Prior to Encounter[1] Allergies[2] Social History   Socioeconomic History   Marital status: Married    Spouse name: Not on file   Number of children: Not on file   Years of education: Not on file   Highest education level: Some college, no degree  Occupational History   Not on file  Tobacco Use   Smoking status: Never   Smokeless tobacco: Never  Vaping Use   Vaping status: Never Used  Substance and Sexual Activity   Alcohol use: No   Drug use: No   Sexual activity: Yes  Other Topics Concern   Not on file  Social History Narrative   Not on file   Social Drivers of Health   Tobacco Use: Low Risk (02/28/2024)   Patient History    Smoking Tobacco Use: Never    Smokeless Tobacco Use: Never    Passive Exposure: Not on file  Financial Resource Strain: Low Risk (12/24/2023)   Overall Financial Resource Strain (CARDIA)    Difficulty of Paying Living Expenses: Not hard at all  Food Insecurity: No Food Insecurity (12/24/2023)   Epic    Worried About Programme Researcher, Broadcasting/film/video in the Last Year: Never true    Ran Out of Food in the Last Year: Never true  Transportation Needs: No Transportation Needs (12/24/2023)   Epic    Lack of Transportation (Medical): No    Lack of Transportation (Non-Medical): No  Physical Activity: Insufficiently Active (12/24/2023)   Exercise Vital Sign    Days of Exercise per Week: 2 days    Minutes of Exercise per Session: 20 min  Stress: Stress Concern Present (12/24/2023)   [employer] of Occupational Health - Occupational Stress Questionnaire    Feeling of Stress: To some extent  Social Connections: Socially Integrated (12/24/2023)   Social Connection and Isolation Panel    Frequency of Communication with Friends and Family: More than three times a week    Frequency of Social Gatherings with Friends and Family: More than three times a week    Attends  Religious Services: More than 4 times per year    Active Member of Clubs or Organizations: Yes    Attends Banker Meetings: More than 4 times per year    Marital Status: Married  Catering Manager Violence: Not At Risk (05/06/2023)   Humiliation, Afraid, Rape, and Kick questionnaire    Fear of Current or Ex-Partner: No    Emotionally Abused: No    Physically Abused: No    Sexually Abused: No  Depression (PHQ2-9): Low Risk (07/15/2023)   Depression (PHQ2-9)    PHQ-2 Score: 0  Alcohol Screen: Low Risk (05/06/2023)   Alcohol Screen    Last Alcohol Screening Score (AUDIT): 0  Housing: Unknown (12/24/2023)   Epic  Unable to Pay for Housing in the Last Year: No    Number of Times Moved in the Last Year: Not on file    Homeless in the Last Year: No  Utilities: Not At Risk (05/06/2023)   [facility] Utilities    Threatened with loss of utilities: No  Health Literacy: Adequate Health Literacy (05/06/2023)   B1300 Health Literacy    Frequency of need for help with medical instructions: Never     Review of Systems  All other systems reviewed and are negative.      Objective:   Physical Exam        Assessment & Plan:   Cervical radiculopathy Patient's weakness and numbness in his right arm suggest cervical radiculopathy.  I recommended that he contact his neurosurgeon and explained to him how severe the weakness in his arm he is having the numbness in his arm.  His neurosurgeon may want to do a more expedited workup or additional imaging of the neck to determine the cause.    [1]  Current Outpatient Medications on File Prior to Visit  Medication Sig Dispense Refill   acetaminophen  (TYLENOL ) 650 MG CR tablet Take 650 mg by mouth every 8 (eight) hours as needed for pain.     aspirin  EC 81 MG tablet Take 81 mg by mouth daily. Swallow whole.     atorvastatin  (LIPITOR) 80 MG tablet Take 1 tablet (80 mg total) by mouth daily. 90 tablet 3   cyclobenzaprine  (FLEXERIL ) 10 MG tablet  Take 0.5-1 tablets (5-10 mg total) by mouth 3 (three) times daily as needed. 30 tablet 0   diazepam  (VALIUM ) 5 MG tablet Take 1 tablet (5 mg total) by mouth every 8 (eight) hours as needed for anxiety or sedation. 30 tablet 0   diphenhydrAMINE (BENADRYL) 25 MG tablet Take 25 mg by mouth 2 (two) times daily. (Patient taking differently: Take 25 mg by mouth at bedtime as needed.)     ezetimibe  (ZETIA ) 10 MG tablet TAKE 1 TABLET BY MOUTH EVERY DAY 90 tablet 3   fish oil-omega-3 fatty acids  1000 MG capsule Take 2 g by mouth daily.     furosemide  (LASIX ) 20 MG tablet Take 1 tablet (20 mg total) by mouth daily as needed. (Patient taking differently: Take 20 mg by mouth daily.) 30 tablet 0   gabapentin (NEURONTIN) 100 MG capsule Take 100 mg by mouth 3 (three) times daily.     HYDROcodone -acetaminophen  (NORCO/VICODIN) 5-325 MG tablet Take 1-2 tablets by mouth every 4 (four) hours as needed for severe pain (pain score 7-10). (Patient not taking: Reported on 03/09/2024)     methylPREDNISolone (MEDROL DOSEPAK) 4 MG TBPK tablet Take 4 mg by mouth daily as needed. (Patient not taking: Reported on 03/09/2024)     metoprolol  tartrate (LOPRESSOR ) 25 MG tablet TAKE 0.5 TABLETS BY MOUTH 2 TIMES DAILY. 90 tablet 3   Multiple Vitamin (MULTIVITAMIN WITH MINERALS) TABS tablet Take 1 tablet by mouth daily.     naproxen  (NAPROSYN ) 500 MG tablet Take 1 tablet (500 mg total) by mouth 2 (two) times daily with a meal. 30 tablet 0   nitroGLYCERIN  (NITROSTAT ) 0.4 MG SL tablet PLACE 1 TABLET UNDER THE TONGUE EVERY 5 MINUTES AS NEEDED FOR CHEST PAIN YOU CAN TAKE UP TO 3 TABLET 25 tablet 0   oxyCODONE -acetaminophen  (PERCOCET/ROXICET) 5-325 MG tablet Take 2 tablets by mouth daily in the afternoon.     pantoprazole  (PROTONIX ) 40 MG tablet Take 1 tablet (40 mg total) by mouth daily.  90 tablet 3   predniSONE  (DELTASONE ) 20 MG tablet 3 tabs poqday 1-2, 2 tabs poqday 3-4, 1 tab poqday 5-6 12 tablet 0   predniSONE  (DELTASONE ) 20 MG tablet 3  tabs poqday 1-2, 2 tabs poqday 3-4, 1 tab poqday 5-6 12 tablet 0   primidone  (MYSOLINE ) 50 MG tablet TAKE 2 TABLETS BY MOUTH AT BEDTIME 180 tablet 1   tamsulosin  (FLOMAX ) 0.4 MG CAPS capsule Take 1 capsule (0.4 mg total) by mouth daily. 90 capsule 1   TURMERIC PO Take 1 capsule by mouth daily.     vitamin C  (ASCORBIC ACID ) 500 MG tablet Take 500 mg by mouth daily.     Zinc 50 MG TABS Take 50 mg by mouth daily.     No current facility-administered medications on file prior to visit.  [2]  Allergies Allergen Reactions   Bismuth Subsalicylate     Other reaction(s): hives   Monosodium Glutamate Swelling    Rash, throat swells   Latex Rash   Tyloxapol Anxiety   Zoster Vac Recomb Adjuvanted Dermatitis   "

## 2024-05-04 ENCOUNTER — Encounter: Payer: Self-pay | Admitting: Neurology

## 2024-05-11 ENCOUNTER — Encounter

## 2024-05-12 ENCOUNTER — Encounter: Admitting: Family Medicine
